# Patient Record
Sex: Female | Born: 1975 | Race: Black or African American | Hispanic: No | Marital: Single | Smoking: Never smoker
Health system: Southern US, Community
[De-identification: ages and names within clinical notes are randomized; demographics above are authoritative.]

## PROBLEM LIST (undated history)

## (undated) DIAGNOSIS — E119 Type 2 diabetes mellitus without complications: Secondary | ICD-10-CM

## (undated) DIAGNOSIS — R109 Unspecified abdominal pain: Secondary | ICD-10-CM

## (undated) DIAGNOSIS — F32A Depression, unspecified: Secondary | ICD-10-CM

## (undated) DIAGNOSIS — R1011 Right upper quadrant pain: Secondary | ICD-10-CM

## (undated) DIAGNOSIS — F329 Major depressive disorder, single episode, unspecified: Secondary | ICD-10-CM

## (undated) DIAGNOSIS — E559 Vitamin D deficiency, unspecified: Secondary | ICD-10-CM

## (undated) DIAGNOSIS — F419 Anxiety disorder, unspecified: Secondary | ICD-10-CM

## (undated) DIAGNOSIS — R569 Unspecified convulsions: Secondary | ICD-10-CM

## (undated) HISTORY — PX: ABDOMINAL HYSTERECTOMY: SHX81

## (undated) HISTORY — PX: TUBAL LIGATION: SHX77

## (undated) HISTORY — DX: Right upper quadrant pain: R10.11

---
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
## 2013-10-02 DIAGNOSIS — R569 Unspecified convulsions: Secondary | ICD-10-CM

## 2013-10-02 HISTORY — DX: Unspecified convulsions: R56.9

## 2015-04-28 ENCOUNTER — Encounter (HOSPITAL_COMMUNITY): Payer: Self-pay | Admitting: Emergency Medicine

## 2015-04-28 ENCOUNTER — Emergency Department (HOSPITAL_COMMUNITY)
Admission: EM | Admit: 2015-04-28 | Discharge: 2015-04-29 | Disposition: A | Discharge status: Home / Self Care | Payer: Medicaid Other | Attending: Emergency Medicine | Admitting: Emergency Medicine

## 2015-04-28 ENCOUNTER — Emergency Department (HOSPITAL_COMMUNITY): Payer: Medicaid Other

## 2015-04-28 DIAGNOSIS — E119 Type 2 diabetes mellitus without complications: Secondary | ICD-10-CM | POA: Diagnosis not present

## 2015-04-28 DIAGNOSIS — N730 Acute parametritis and pelvic cellulitis: Secondary | ICD-10-CM | POA: Diagnosis not present

## 2015-04-28 DIAGNOSIS — M546 Pain in thoracic spine: Secondary | ICD-10-CM | POA: Diagnosis not present

## 2015-04-28 DIAGNOSIS — R55 Syncope and collapse: Secondary | ICD-10-CM | POA: Diagnosis present

## 2015-04-28 DIAGNOSIS — R42 Dizziness and giddiness: Secondary | ICD-10-CM | POA: Insufficient documentation

## 2015-04-28 DIAGNOSIS — R102 Pelvic and perineal pain: Secondary | ICD-10-CM

## 2015-04-28 DIAGNOSIS — Z3202 Encounter for pregnancy test, result negative: Secondary | ICD-10-CM | POA: Diagnosis not present

## 2015-04-28 DIAGNOSIS — R531 Weakness: Secondary | ICD-10-CM | POA: Diagnosis not present

## 2015-04-28 DIAGNOSIS — M542 Cervicalgia: Secondary | ICD-10-CM | POA: Insufficient documentation

## 2015-04-28 HISTORY — DX: Type 2 diabetes mellitus without complications: E11.9

## 2015-04-28 LAB — COMPREHENSIVE METABOLIC PANEL
ALT: 15 U/L (ref 14–54)
AST: 19 U/L (ref 15–41)
Albumin: 3.9 g/dL (ref 3.5–5.0)
Alkaline Phosphatase: 58 U/L (ref 38–126)
Anion gap: 11 (ref 5–15)
BUN: 15 mg/dL (ref 6–20)
CO2: 19 mmol/L — ABNORMAL LOW (ref 22–32)
Calcium: 9.1 mg/dL (ref 8.9–10.3)
Chloride: 109 mmol/L (ref 101–111)
Creatinine, Ser: 1.12 mg/dL — ABNORMAL HIGH (ref 0.44–1.00)
GFR calc Af Amer: >60 mL/min (ref >60)
GFR calc non Af Amer: >60 mL/min (ref >60)
Glucose, Bld: 82 mg/dL (ref 65–99)
Potassium: 4 mmol/L (ref 3.5–5.1)
Sodium: 139 mmol/L (ref 135–145)
Total Bilirubin: 0.7 mg/dL (ref 0.3–1.2)
Total Protein: 7 g/dL (ref 6.5–8.1)

## 2015-04-28 LAB — URINALYSIS, ROUTINE W REFLEX MICROSCOPIC
Bilirubin Urine: NEGATIVE
Glucose, UA: NEGATIVE mg/dL
Ketones, ur: NEGATIVE mg/dL
Leukocytes, UA: NEGATIVE
Nitrite: NEGATIVE
Protein, ur: NEGATIVE mg/dL
Specific Gravity, Urine: 1.024 (ref 1.005–1.030)
Urobilinogen, UA: 1 mg/dL (ref 0.0–1.0)
pH: 5.5 (ref 5.0–8.0)

## 2015-04-28 LAB — CBC WITH DIFFERENTIAL/PLATELET
Basophils Absolute: 0 10*3/uL (ref 0.0–0.1)
Basophils Relative: 1 % (ref 0–1)
Eosinophils Absolute: 0.2 10*3/uL (ref 0.0–0.7)
Eosinophils Relative: 3 % (ref 0–5)
HCT: 33.6 % — ABNORMAL LOW (ref 36.0–46.0)
Hemoglobin: 11 g/dL — ABNORMAL LOW (ref 12.0–15.0)
Lymphocytes Relative: 44 % (ref 12–46)
Lymphs Abs: 3 10*3/uL (ref 0.7–4.0)
MCH: 28.4 pg (ref 26.0–34.0)
MCHC: 32.7 g/dL (ref 30.0–36.0)
MCV: 86.8 fL (ref 78.0–100.0)
Monocytes Absolute: 0.5 10*3/uL (ref 0.1–1.0)
Monocytes Relative: 7 % (ref 3–12)
Neutro Abs: 3.1 10*3/uL (ref 1.7–7.7)
Neutrophils Relative %: 45 % (ref 43–77)
Platelets: 206 10*3/uL (ref 150–400)
RBC: 3.87 MIL/uL (ref 3.87–5.11)
RDW: 17.4 % — ABNORMAL HIGH (ref 11.5–15.5)
WBC: 6.8 10*3/uL (ref 4.0–10.5)

## 2015-04-28 LAB — URINE MICROSCOPIC-ADD ON

## 2015-04-28 LAB — I-STAT CG4 LACTIC ACID, ED
Lactic Acid, Venous: 1.52 mmol/L (ref 0.5–2.0)
Lactic Acid, Venous: 2.35 mmol/L (ref 0.5–2.0)

## 2015-04-28 LAB — CK: Total CK: 123 U/L (ref 38–234)

## 2015-04-28 LAB — WET PREP, GENITAL: Yeast Wet Prep HPF POC: NONE SEEN

## 2015-04-28 LAB — CBG MONITORING, ED
Glucose-Capillary: 100 mg/dL — ABNORMAL HIGH (ref 65–99)
Glucose-Capillary: 55 mg/dL — ABNORMAL LOW (ref 65–99)

## 2015-04-28 LAB — LIPASE, BLOOD: Lipase: 24 U/L (ref 22–51)

## 2015-04-28 LAB — PREGNANCY, URINE: Preg Test, Ur: NEGATIVE

## 2015-04-28 MED ORDER — SODIUM CHLORIDE 0.9 % IV BOLUS (SEPSIS)
1000.0000 mL | Freq: Once | INTRAVENOUS | Status: AC
  Administered 2015-04-28: 1000 mL via INTRAVENOUS

## 2015-04-28 MED ORDER — DOXYCYCLINE HYCLATE 100 MG PO CAPS: 100.0000 mg | ORAL_CAPSULE | Freq: Two times a day (BID) | ORAL | Status: DC

## 2015-04-28 MED ORDER — MORPHINE SULFATE 4 MG/ML IJ SOLN
4.0000 mg | Freq: Once | INTRAMUSCULAR | Status: AC
  Administered 2015-04-28: 4 mg via INTRAVENOUS
  Filled 2015-04-28: qty 1

## 2015-04-28 MED ORDER — CEFTRIAXONE SODIUM 250 MG IJ SOLR
250.0000 mg | Freq: Once | INTRAMUSCULAR | Status: AC
  Administered 2015-04-28: 250 mg via INTRAMUSCULAR
  Filled 2015-04-28: qty 250

## 2015-04-28 MED ORDER — METRONIDAZOLE 500 MG PO TABS: 500.0000 mg | ORAL_TABLET | Freq: Two times a day (BID) | ORAL | Status: DC

## 2015-04-28 MED ORDER — LIDOCAINE HCL (PF) 1 % IJ SOLN
5.0000 mL | Freq: Once | INTRAMUSCULAR | Status: AC
  Administered 2015-04-28: 5 mL
  Filled 2015-04-28: qty 5

## 2015-04-28 NOTE — ED Notes (Signed)
CBG 100 

## 2015-04-28 NOTE — ED Provider Notes (Signed)
CSN: [id number]     Arrival date & time 04/28/15  1656 History   First MD Initiated Contact with Patient 04/28/15 1705     Chief Complaint  Patient presents with  . Loss of Consciousness     (Consider location/radiation/quality/duration/timing/severity/associated sxs/prior Treatment) HPI  Patient is a 39 year old female with a history of type 1 diabetes and reports seizures from hypoglycemia in the past presenting today for loss of consciousness. Patient reportedly walked to [employer] roughly 5 miles away and walked back with the groceries. When she returned she had a syncopal episode that was witnessed by her family. Deny any seizure-like activity. Patient does report palpitations prior to the event and felt like she was becoming weak. Also reports feeling that her blood sugar may have been decreased.  On arrival EMS found blood sugar to be 77 and gave patient food. Patient reports feeling weak and hurting in her neck and her back with generalized muscle aches since the event. No postictal period per EMS. Patient denies urinating herself or biting her tongue. Now reports mild chest pain with no shortness of breath. Reports grandmother  Past Medical History  Diagnosis Date  . Diabetes mellitus without complication    Past Surgical History  Procedure Laterality Date  . Tubal ligation     History reviewed. No pertinent family history. History  Substance Use Topics  . Smoking status: Never Smoker   . Smokeless tobacco: Not on file  . Alcohol Use: No   OB History    No data available     Review of Systems  Constitutional: Negative for fever and chills.  HENT: Negative for congestion and sore throat.   Eyes: Negative for pain.  Respiratory: Negative for cough and shortness of breath.   Cardiovascular: Negative for chest pain and palpitations.  Gastrointestinal: Negative for nausea, vomiting, abdominal pain and diarrhea.  Genitourinary: Negative for dysuria and flank pain.    Musculoskeletal: Positive for myalgias, back pain and neck pain. Negative for neck stiffness.  Skin: Negative for rash.  Allergic/Immunologic: Negative.   Neurological: Positive for dizziness, syncope, weakness and light-headedness. Negative for tremors, seizures, facial asymmetry, speech difficulty, numbness and headaches.  Psychiatric/Behavioral: Negative for confusion.      Allergies  Review of patient's allergies indicates no known allergies.  Home Medications   Prior to Admission medications   Medication Sig Start Date End Date Taking? Authorizing Provider  doxycycline (VIBRAMYCIN) 100 MG capsule Take 1 capsule (100 mg total) by mouth 2 (two) times daily. 04/28/15   Tery Sanfilippo, MD  metroNIDAZOLE (FLAGYL) 500 MG tablet Take 1 tablet (500 mg total) by mouth 2 (two) times daily. 04/28/15   Tery Sanfilippo, MD   BP 120/75 mmHg  Pulse 54  Temp(Src) 98.7 F (37.1 C) (Oral)  Resp 11  Ht 6\' 1"  (1.854 m)  Wt 200 lb (90.719 kg)  BMI 26.39 kg/m2  SpO2 100% Physical Exam  Constitutional: She is oriented to person, place, and time. She appears well-developed and well-nourished. No distress.  HENT:  Head: Normocephalic and atraumatic.  Eyes: Conjunctivae and EOM are normal. Pupils are equal, round, and reactive to light.  Neck: Normal range of motion. Neck supple.  Cardiovascular: Normal rate, regular rhythm and normal heart sounds.   Pulmonary/Chest: Effort normal and breath sounds normal. No respiratory distress.  Abdominal: Soft. Bowel sounds are normal. There is tenderness in the left lower quadrant. There is no rigidity, no rebound, no guarding, no CVA tenderness, no tenderness at McBurney's point and  negative Murphy's sign.    Genitourinary: Vagina normal. There is no rash, tenderness, lesion or injury on the right labia. There is no rash, tenderness, lesion or injury on the left labia. Cervix exhibits motion tenderness and discharge (frothy white). Cervix exhibits no  friability. Right adnexum displays no mass, no tenderness and no fullness. Left adnexum displays tenderness. Left adnexum displays no mass and no fullness.  Musculoskeletal: Normal range of motion.       Thoracic back: She exhibits tenderness. She exhibits normal range of motion and no bony tenderness.       Back:  Neurological: She is alert and oriented to person, place, and time. She has normal strength and normal reflexes. She displays normal reflexes. No cranial nerve deficit or sensory deficit. She displays a negative Romberg sign. GCS eye subscore is 4. GCS verbal subscore is 5. GCS motor subscore is 6.  Normal finger to nose bilaterally.  Rapid alternating movements intact bilaterally.  Normal heal to shin bilaterally.   No pronator drift bilaterally.    Skin: Skin is warm and dry. She is not diaphoretic.  Psychiatric: She has a normal mood and affect.    ED Course  Procedures (including critical care time) Labs Review Labs Reviewed  WET PREP, GENITAL - Abnormal; Notable for the following:    Trich, Wet Prep FEW (*)    Clue Cells Wet Prep HPF POC MANY (*)    WBC, Wet Prep HPF POC FEW (*)    All other components within normal limits  CBC WITH DIFFERENTIAL/PLATELET - Abnormal; Notable for the following:    Hemoglobin 11.0 (*)    HCT 33.6 (*)    RDW 17.4 (*)    All other components within normal limits  COMPREHENSIVE METABOLIC PANEL - Abnormal; Notable for the following:    CO2 19 (*)    Creatinine, Ser 1.12 (*)    All other components within normal limits  URINALYSIS, ROUTINE W REFLEX MICROSCOPIC (NOT AT [facility]) - Abnormal; Notable for the following:    APPearance CLOUDY (*)    Hgb urine dipstick TRACE (*)    All other components within normal limits  URINE MICROSCOPIC-ADD ON - Abnormal; Notable for the following:    Squamous Epithelial / LPF MANY (*)    Bacteria, UA FEW (*)    All other components within normal limits  CBG MONITORING, ED - Abnormal; Notable for the  following:    Glucose-Capillary 100 (*)    All other components within normal limits  I-STAT CG4 LACTIC ACID, ED - Abnormal; Notable for the following:    Lactic Acid, Venous 2.35 (*)    All other components within normal limits  CBG MONITORING, ED - Abnormal; Notable for the following:    Glucose-Capillary 55 (*)    All other components within normal limits  CBG MONITORING, ED - Abnormal; Notable for the following:    Glucose-Capillary 100 (*)    All other components within normal limits  URINE CULTURE  CK  LIPASE, BLOOD  PREGNANCY, URINE  I-STAT CG4 LACTIC ACID, ED  GC/CHLAMYDIA PROBE AMP ([place]) NOT AT [facility]    Imaging Review US Transvaginal Non-ob  04/28/2015   CLINICAL DATA:  Left adnexal tenderness for 2-3 days  EXAM: TRANSABDOMINAL AND TRANSVAGINAL ULTRASOUND OF PELVIS  TECHNIQUE: Both transabdominal and transvaginal ultrasound examinations of the pelvis were performed. Transabdominal technique was performed for global imaging of the pelvis including uterus, ovaries, adnexal regions, and pelvic cul-de-sac. It was necessary to proceed  with endovaginal exam following the transabdominal exam to visualize the uterus, endometrium and ovaries.  COMPARISON:  None  FINDINGS: Uterus  Measurements: 10.1 x 5.0 x 7.3 cm. Anterior intramural fibroid measures 4.8 x 4.2 x 4.7 cm.  Endometrium  Thickness: 9.4 mm.  No focal abnormality visualized.  Right ovary  Measurements: 2.9 x 1.6 x 2.0 cm. Normal appearance/no adnexal mass.  Left ovary  Measurements: 3.8 x 1.9 x 2.0 cm. Normal appearance/no adnexal mass.  Other findings  No free fluid.  IMPRESSION: 1. No acute findings. 2. Anterior intramural fibroid   Electronically Signed   By: Signa Kell M.D.   On: 04/28/2015 22:15   US Pelvis Complete  04/28/2015   CLINICAL DATA:  Left adnexal tenderness for 2-3 days  EXAM: TRANSABDOMINAL AND TRANSVAGINAL ULTRASOUND OF PELVIS  TECHNIQUE: Both transabdominal and transvaginal ultrasound examinations of  the pelvis were performed. Transabdominal technique was performed for global imaging of the pelvis including uterus, ovaries, adnexal regions, and pelvic cul-de-sac. It was necessary to proceed with endovaginal exam following the transabdominal exam to visualize the uterus, endometrium and ovaries.  COMPARISON:  None  FINDINGS: Uterus  Measurements: 10.1 x 5.0 x 7.3 cm. Anterior intramural fibroid measures 4.8 x 4.2 x 4.7 cm.  Endometrium  Thickness: 9.4 mm.  No focal abnormality visualized.  Right ovary  Measurements: 2.9 x 1.6 x 2.0 cm. Normal appearance/no adnexal mass.  Left ovary  Measurements: 3.8 x 1.9 x 2.0 cm. Normal appearance/no adnexal mass.  Other findings  No free fluid.  IMPRESSION: 1. No acute findings. 2. Anterior intramural fibroid   Electronically Signed   By: Signa Kell M.D.   On: 04/28/2015 22:15     EKG Interpretation   Date/Time:  Wednesday April 28 2015 17:07:08 EDT Ventricular Rate:  80 PR Interval:  179 QRS Duration: 83 QT Interval:  396 QTC Calculation: 457 R Axis:   68 Text Interpretation:  Sinus rhythm Consider left ventricular hypertrophy  Borderline T abnormalities, anterior leads No old tracing to compare  Confirmed by KNAPP  MD-J, JON ([id number]) on 04/28/2015 5:17:09 PM      MDM   Final diagnoses:  PID (acute pelvic inflammatory disease)  Syncope and collapse    Patient is a 39 year old female with a history of type 1 diabetes and reports seizures from hypoglycemia in the past presenting today for loss of consciousness  Ddx seizure, hypoglycemia, arrhythmogenic syncope, ACS, PE, vasovagal syncope.   On evaluation pt HDS in NAD.  Per EMS no seizure activity reported on the scene by family who witnessed.  She was caught and did not hit head.  Do not feel imaging warranted at this time.  Only borderline abnormalities and doubt arrhythmogenic syncope with no interval changes, brugada, or signs of HOCM present. Doubt ACS and pt with no CP or SOB with no  tachycardia or tachypnea.  Doubt PE.  Most likely hypoglycemia and/or vasovagal syncope from heat.   Pt reports abdominal pain with no peritoneal signs on exam.  Pelvic with CMT and mild LLQ tenderness.  Positive for trich and BV.  US pelvis negative for TOA, torsion, or cyst.  Pregnancy test negative.  Pt treated for PID and given information for follow up.   If performed, labs, EKGs, and imaging were reviewed/interpreted by myself and my attending and incorporated into medical decision making.  Discussed pertinent finding with patient or caregiver prior to discharge with no further questions.  Immediate return precautions given and pt or caregiver reports  understanding.  Pt care supervised by my attending Dr. Lynelle Doctor.   Tery Sanfilippo, MD PGY-2  Emergency Medicine    Tery Sanfilippo, MD 04/29/15 1242  Linwood Dibbles, MD 04/29/15 [phone]

## 2015-04-28 NOTE — ED Notes (Signed)
Pt walked to [employer] ( approx 5 miles)to get groceries and while walking back had a syncopal episode , pt states that she had a positive loc , family on scene helped her to the ground, pt is c/o general aches and pains , pt is a diabetic cbg was 70

## 2015-04-28 NOTE — ED Notes (Signed)
Patient to ultrasound

## 2015-04-28 NOTE — ED Notes (Signed)
Patient returned from ultrasound.

## 2015-04-29 LAB — GC/CHLAMYDIA PROBE AMP (~~LOC~~) NOT AT ARMC
Chlamydia: POSITIVE — AB
Neisseria Gonorrhea: NEGATIVE

## 2015-04-29 LAB — CBG MONITORING, ED: Glucose-Capillary: 100 mg/dL — ABNORMAL HIGH (ref 65–99)

## 2015-04-30 ENCOUNTER — Telehealth (HOSPITAL_COMMUNITY): Payer: Self-pay

## 2015-04-30 LAB — URINE CULTURE

## 2015-04-30 NOTE — Telephone Encounter (Signed)
Spoke with pt. Verified ID. Informed of labs. Treated per protocol. [employer] form faxed. Pt informed to abstain from sexual activity x 10 days and to notify partner for testing and treatment.  

## 2015-06-19 ENCOUNTER — Emergency Department (HOSPITAL_COMMUNITY): Payer: Medicaid Other

## 2015-06-19 ENCOUNTER — Observation Stay (HOSPITAL_COMMUNITY)
Admission: EM | Admit: 2015-06-19 | Discharge: 2015-06-21 | Disposition: A | Discharge status: Home / Self Care | Payer: Medicaid Other | Attending: Family Medicine | Admitting: Family Medicine

## 2015-06-19 ENCOUNTER — Encounter (HOSPITAL_COMMUNITY): Payer: Self-pay

## 2015-06-19 DIAGNOSIS — R440 Auditory hallucinations: Secondary | ICD-10-CM | POA: Diagnosis not present

## 2015-06-19 DIAGNOSIS — E119 Type 2 diabetes mellitus without complications: Secondary | ICD-10-CM

## 2015-06-19 DIAGNOSIS — R569 Unspecified convulsions: Secondary | ICD-10-CM | POA: Diagnosis not present

## 2015-06-19 DIAGNOSIS — F329 Major depressive disorder, single episode, unspecified: Secondary | ICD-10-CM

## 2015-06-19 DIAGNOSIS — F32A Depression, unspecified: Secondary | ICD-10-CM

## 2015-06-19 DIAGNOSIS — F319 Bipolar disorder, unspecified: Secondary | ICD-10-CM | POA: Diagnosis not present

## 2015-06-19 DIAGNOSIS — R780 Finding of alcohol in blood: Secondary | ICD-10-CM

## 2015-06-19 DIAGNOSIS — G40909 Epilepsy, unspecified, not intractable, without status epilepticus: Principal | ICD-10-CM | POA: Insufficient documentation

## 2015-06-19 DIAGNOSIS — F419 Anxiety disorder, unspecified: Secondary | ICD-10-CM | POA: Insufficient documentation

## 2015-06-19 HISTORY — DX: Major depressive disorder, single episode, unspecified: F32.9

## 2015-06-19 HISTORY — DX: Anxiety disorder, unspecified: F41.9

## 2015-06-19 HISTORY — DX: Depression, unspecified: F32.A

## 2015-06-19 LAB — COMPREHENSIVE METABOLIC PANEL
ALT: 52 U/L (ref 14–54)
AST: 45 U/L — ABNORMAL HIGH (ref 15–41)
Albumin: 4.3 g/dL (ref 3.5–5.0)
Alkaline Phosphatase: 87 U/L (ref 38–126)
Anion gap: 9 (ref 5–15)
BUN: 7 mg/dL (ref 6–20)
CO2: 22 mmol/L (ref 22–32)
Calcium: 9 mg/dL (ref 8.9–10.3)
Chloride: 106 mmol/L (ref 101–111)
Creatinine, Ser: 1.02 mg/dL — ABNORMAL HIGH (ref 0.44–1.00)
GFR calc Af Amer: >60 mL/min (ref >60)
GFR calc non Af Amer: >60 mL/min (ref >60)
Glucose, Bld: 118 mg/dL — ABNORMAL HIGH (ref 65–99)
Potassium: 4 mmol/L (ref 3.5–5.1)
Sodium: 137 mmol/L (ref 135–145)
Total Bilirubin: 0.5 mg/dL (ref 0.3–1.2)
Total Protein: 9 g/dL — ABNORMAL HIGH (ref 6.5–8.1)

## 2015-06-19 LAB — CBG MONITORING, ED
Glucose-Capillary: 116 mg/dL — ABNORMAL HIGH (ref 65–99)
Glucose-Capillary: 74 mg/dL (ref 65–99)

## 2015-06-19 LAB — I-STAT BETA HCG BLOOD, ED (MC, WL, AP ONLY): I-stat hCG, quantitative: <5 m[IU]/mL (ref <5)

## 2015-06-19 LAB — CBC
HCT: 35.2 % — ABNORMAL LOW (ref 36.0–46.0)
Hemoglobin: 11.4 g/dL — ABNORMAL LOW (ref 12.0–15.0)
MCH: 28.5 pg (ref 26.0–34.0)
MCHC: 32.4 g/dL (ref 30.0–36.0)
MCV: 88 fL (ref 78.0–100.0)
Platelets: 220 10*3/uL (ref 150–400)
RBC: 4 MIL/uL (ref 3.87–5.11)
RDW: 17.5 % — ABNORMAL HIGH (ref 11.5–15.5)
WBC: 7.3 10*3/uL (ref 4.0–10.5)

## 2015-06-19 LAB — ACETAMINOPHEN LEVEL: Acetaminophen (Tylenol), Serum: <10 ug/mL — ABNORMAL LOW (ref 10–30)

## 2015-06-19 LAB — ETHANOL: Alcohol, Ethyl (B): 96 mg/dL — ABNORMAL HIGH (ref <5)

## 2015-06-19 LAB — CK: Total CK: 232 U/L (ref 38–234)

## 2015-06-19 LAB — I-STAT CG4 LACTIC ACID, ED: Lactic Acid, Venous: 1.96 mmol/L (ref 0.5–2.0)

## 2015-06-19 LAB — SALICYLATE LEVEL: Salicylate Lvl: <4 mg/dL (ref 2.8–30.0)

## 2015-06-19 NOTE — ED Provider Notes (Signed)
CSN: [id number]     Arrival date & time 06/19/15  2000 History   First MD Initiated Contact with Patient 06/19/15 2013     Chief Complaint  Patient presents with  . Seizures    Patient is a 39 y.o. female presenting with general illness. The history is provided by the EMS personnel and medical records. The history is limited by the condition of the patient. No language interpreter was used.  Illness Location:  Generalized Quality:  Seizure Severity:  Unable to specify Onset quality:  Unable to specify Duration: Uknown. Timing:  Unable to specify Progression:  Unable to specify Chronicity:  New Context:  PMHx of DM presenting via EMS with seizure. Patient at home with family. Last seen normal yesterday. Was found on floor in room actively seizing by her daughter. EMS called. Patient unresponsive and seizing upon arrival. Point care glucose 80. D5 normal saline initiated. Versed 2.5 mg given with cessation. Patient protecting airway. Underwent another seizure in rounds with an additional 2.5 mg Versed given. No known history of seizures.   Past Medical History  Diagnosis Date  . Diabetes mellitus without complication    Past Surgical History  Procedure Laterality Date  . Tubal ligation     No family history on file. Social History  Substance Use Topics  . Smoking status: Never Smoker   . Smokeless tobacco: None  . Alcohol Use: No   OB History    No data available     Review of Systems  Unable to perform ROS Neurological: Positive for seizures.    Allergies  Review of patient's allergies indicates no known allergies.  Home Medications   Prior to Admission medications   Medication Sig Start Date End Date Taking? Authorizing Provider  doxycycline (VIBRAMYCIN) 100 MG capsule Take 1 capsule (100 mg total) by mouth 2 (two) times daily. 04/28/15   Tery Sanfilippo, MD  metroNIDAZOLE (FLAGYL) 500 MG tablet Take 1 tablet (500 mg total) by mouth 2 (two) times daily. 04/28/15    Tery Sanfilippo, MD   BP 120/85 mmHg  Pulse 74  Temp(Src) 98.2 F (36.8 C) (Temporal)  Resp 15  SpO2 98%   Physical Exam  Constitutional: No distress.  Generalized quivering consistent with chills but no evidence of active seizure  HENT:  Head: Normocephalic and atraumatic.  Eyes: Conjunctivae are normal. Pupils are equal, round, and reactive to light.  Neck: Normal range of motion. Neck supple.  Cardiovascular: Normal rate, regular rhythm and intact distal pulses.   Pulmonary/Chest: Effort normal and breath sounds normal. No respiratory distress.  Abdominal: Soft. Bowel sounds are normal. She exhibits no distension.  Musculoskeletal: Normal range of motion.  Neurological: She displays normal reflexes. She exhibits normal muscle tone.  Patient opening eyes spontaneously, following commands, but not verbal - only moaning to pain.  Skin: Skin is warm and dry. She is not diaphoretic.    ED Course  Procedures   Labs Review Labs Reviewed  CBC - Abnormal; Notable for the following:    Hemoglobin 11.4 (*)    HCT 35.2 (*)    RDW 17.5 (*)    All other components within normal limits  COMPREHENSIVE METABOLIC PANEL - Abnormal; Notable for the following:    Glucose, Bld 118 (*)    Creatinine, Ser 1.02 (*)    Total Protein 9.0 (*)    AST 45 (*)    All other components within normal limits  ACETAMINOPHEN LEVEL - Abnormal; Notable for the following:  Acetaminophen (Tylenol), Serum <10 (*)    All other components within normal limits  ETHANOL - Abnormal; Notable for the following:    Alcohol, Ethyl (B) 96 (*)    All other components within normal limits  CBG MONITORING, ED - Abnormal; Notable for the following:    Glucose-Capillary 116 (*)    All other components within normal limits  CK  SALICYLATE LEVEL  URINE RAPID DRUG SCREEN, HOSP PERFORMED  I-STAT BETA HCG BLOOD, ED ([facility], [facility], [facility] ONLY)  I-STAT CG4 LACTIC ACID, ED  CBG MONITORING, ED  I-STAT CG4 LACTIC ACID, ED     Imaging Review Ct Head Wo Contrast  06/19/2015   CLINICAL DATA:  39 year old female with new onset seizures. Patient found on the floor at home by daughter  EXAM: CT HEAD WITHOUT CONTRAST  TECHNIQUE: Contiguous axial images were obtained from the base of the skull through the vertex without intravenous contrast.  COMPARISON:  None.  FINDINGS: Evaluation of this exam is limited due to motion artifact.  The ventricles and sulci are appropriate in size for the patient's age. There is no intracranial hemorrhage. No mass effect or midline shift identified. The gray-white matter differentiation is preserved. There is no extra-axial fluid collection.  The visualized paranasal sinuses and mastoid air cells are well aerated. The calvarium is intact.  IMPRESSION: No acute intracranial pathology.   Electronically Signed   By: Elgie Collard M.D.   On: 06/19/2015 21:25   Dg Chest Portable 1 View  06/19/2015   CLINICAL DATA:  39 year old female found on the floor activity seizing.  EXAM: PORTABLE CHEST - 1 VIEW  COMPARISON:  None.  FINDINGS: The heart size and mediastinal contours are within normal limits. Both lungs are clear. The visualized skeletal structures are unremarkable.  IMPRESSION: No active disease.   Electronically Signed   By: Elgie Collard M.D.   On: 06/19/2015 20:49   I have personally reviewed and evaluated these images and lab results as part of my medical decision-making.   EKG Interpretation None      MDM  Jennifer Crawford is a 39 yo female w/ PMHx of DM presenting via EMS with seizure. Patient at home with family. Last seen normal yesterday. Was found on floor in room actively seizing by her daughter. EMS called. Patient unresponsive and seizing upon arrival. Point care glucose 80. D5 normal saline initiated. Versed 2.5 mg given with cessation. Patient protecting airway. Underwent another seizure in rounds with an additional 2.5 mg Versed given. No known history of seizures.  Exam  above notable for middle-age female lying in stretcher. Actively quivering but no evidence of active seizure. Afebrile. Heart rate 90s to low 100s. Normotensive. Maintaining saturations on room air without supplemental oxygen. Abdomen soft and nondistended. Patient opening eyes spontaneously, following commands, but not verbal - only moaning to pain.  EKG showing sinus tachycardia but no evidence of ST elevation or depression. I-STAT lactic acid 1.96. APAP with systolic levels undetectable. Alcohol level XCVI. CBC and CMP relatively unremarkable. CK level 232. CT head showing no acute cranial abnormality.  Patient still drowsy in the ED. Given 2 seizures within a few hour period with no previous history as well as a prolonged postictal state, neurology was consulted and evaluated the patient in the emergency department with recommendations to admit for brain MRI and EEG. Patient admitted to hospital service for the above recommendations.   Pt care discussed with and followed by my attending, Dr. Ree Kida,  MD Pager [phone]  Final diagnoses:  Seizure    Angelina Ok, MD 06/19/15 [id number]  Lavera Guise, MD 06/20/15 1450

## 2015-06-19 NOTE — ED Notes (Signed)
NRB removed, 2L [place] placed, O2 100%.

## 2015-06-19 NOTE — ED Notes (Signed)
Pt able to follow some commands upon arrival and now patient beginning to mumble, but incomprehensible at this time.

## 2015-06-19 NOTE — Consult Note (Signed)
Neurology Consultation Reason for Consult: Seizures Referring Physician: Verdie Mosher, D  CC: Seizures  History is obtained from:Patient  HPI: Jennifer Crawford is a 38 y.o. female with a history of depression, DM, HTN who presents with new onset seizure. She was having some beers with friends and then began walking home and states that she began to feel funny, with blurred vision on the way home. Today she was found actively seizing by her daughter. It is unclear how long she had been seizing for. She was given Versed which cessation of the seizure. She then had another seizure and was given another 2.5 mg of IV Versed with cessation.  Since then, she has had improvement in his becoming talkative.  She denies staring spells, lost time, previous seizures. She does have a history of depression and sometimes hears voices telling her things in the evening. This is something that has been followed by her psychiatrist in [place]. She no longer is able to see that psychiatrist since moving here.  She took Xanax daily at night for quite a while but stopped about 2 weeks ago.   ROS: A 14 point ROS was performed and is negative except as noted in the HPI.   Past Medical History  Diagnosis Date  . Diabetes mellitus without complication      Family history: Seizures in her daughter.   Social History:  reports that she has never smoked. She does not have any smokeless tobacco history on file. She reports that she does not drink alcohol or use illicit drugs.   Exam: Current vital signs: BP 130/79 mmHg  Pulse 93  Temp(Src) 98.2 F (36.8 C) (Temporal)  Resp 16  SpO2 98% Vital signs in last 24 hours: Temp:  [97.2 F (36.2 C)-98.2 F (36.8 C)] 98.2 F (36.8 C) (09/17 2019) Pulse Rate:  [93-114] 93 (09/17 2030) Resp:  [16-21] 16 (09/17 2030) BP: (130-145)/(79-84) 130/79 mmHg (09/17 2030) SpO2:  [98 %] 98 % (09/17 2003)  Physical Exam  Constitutional: Appears well-developed and well-nourished.   Psych: Affect appropriate to situation Eyes: No scleral injection HENT: No OP obstrucion Head: Normocephalic.  Cardiovascular: Normal rate and regular rhythm.  Respiratory: Effort normal and breath sounds normal to anterior ascultation GI: Soft.  No distension. There is no tenderness.  Skin: WDI  Neuro: Mental Status: Patient is awake, alert, oriented to person, place, month, year, and situation. Patient is able to give a clear and coherent history. No signs of aphasia or neglect Cranial Nerves: II: Visual Fields are full. Pupils are equal, round, and reactive to light.   III,IV, VI: EOMI without ptosis or diploplia.  V: Facial sensation is symmetric to temperature VII: Smile is asymmetric with less movement on the right and left and a mildly decreased nasolabial fold on the right. VIII: hearing is intact to voice X: Uvula elevates symmetrically XI: Shoulder shrug is symmetric. XII: tongue is midline without atrophy or fasciculations.  Motor: Tone is normal. Bulk is normal. 5/5 strength was present in all four extremities.  Sensory: Sensation is symmetric to light touch and temperature in the arms and legs. Deep Tendon Reflexes: 2+ and symmetric in the biceps and patellae.  Plantars: Toes are downgoing bilaterally.  Cerebellar: FNF and HKS are intact bilaterally         I have reviewed labs in epic and the results pertinent to this consultation are: BMP-unremarkable other than mildly elevated creatinine  I have reviewed the images obtained: CT head-unremarkable  Impression: 39 year old with new  onset seizure. Though there were 2 in immediate sequence, this was to be considered a single event for the consideration of starting antiepileptics therapy. 2 weeks does seem like it would be a long time for a Xanax withdrawal seizure, though I wonder if there could be some contribution. I would not favor starting antiepileptics therapy at this time but would pursue further  workup.   Recommendations: 1) MRI brain 2) EEG 3) if further seizures occur, then would start Keppra 1000 mg IV 1 followed by 500 mg twice a day  4) once patient is less drowsy, would discuss with her that she is not allowed to drive.    Ritta Slot, MD [facility] [phone]  If 7pm- 7am, please page neurology on call as listed in AMION.
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
## 2015-06-19 NOTE — ED Notes (Signed)
PER EMS: pt found on floor at home by daughter, actively seizing, no hx of seizures. Pt unresponsive with EMS and she began seizing again en route,  of Versed total given en route. Right side gaze, seizures lasted about 2 minutes. EMS assisting with ventilations. Family reports pt has been drinking beer heavily for the last few days. LSN yesterday. BP- 141/94, HR-96, CBG-80.

## 2015-06-20 ENCOUNTER — Observation Stay (HOSPITAL_COMMUNITY): Payer: Medicaid Other

## 2015-06-20 ENCOUNTER — Encounter (HOSPITAL_COMMUNITY): Payer: Self-pay | Admitting: Internal Medicine

## 2015-06-20 DIAGNOSIS — F329 Major depressive disorder, single episode, unspecified: Secondary | ICD-10-CM

## 2015-06-20 DIAGNOSIS — F419 Anxiety disorder, unspecified: Secondary | ICD-10-CM

## 2015-06-20 DIAGNOSIS — E119 Type 2 diabetes mellitus without complications: Secondary | ICD-10-CM | POA: Diagnosis not present

## 2015-06-20 DIAGNOSIS — R780 Finding of alcohol in blood: Secondary | ICD-10-CM

## 2015-06-20 DIAGNOSIS — R569 Unspecified convulsions: Secondary | ICD-10-CM | POA: Diagnosis not present

## 2015-06-20 DIAGNOSIS — F32A Depression, unspecified: Secondary | ICD-10-CM

## 2015-06-20 LAB — I-STAT CG4 LACTIC ACID, ED: Lactic Acid, Venous: 1.48 mmol/L (ref 0.5–2.0)

## 2015-06-20 LAB — BASIC METABOLIC PANEL
Anion gap: 7 (ref 5–15)
BUN: 7 mg/dL (ref 6–20)
CO2: 23 mmol/L (ref 22–32)
Calcium: 8.6 mg/dL — ABNORMAL LOW (ref 8.9–10.3)
Chloride: 108 mmol/L (ref 101–111)
Creatinine, Ser: 0.97 mg/dL (ref 0.44–1.00)
GFR calc Af Amer: >60 mL/min (ref >60)
GFR calc non Af Amer: >60 mL/min (ref >60)
Glucose, Bld: 95 mg/dL (ref 65–99)
Potassium: 3.5 mmol/L (ref 3.5–5.1)
Sodium: 138 mmol/L (ref 135–145)

## 2015-06-20 LAB — CBC
HCT: 32.4 % — ABNORMAL LOW (ref 36.0–46.0)
Hemoglobin: 10.6 g/dL — ABNORMAL LOW (ref 12.0–15.0)
MCH: 29 pg (ref 26.0–34.0)
MCHC: 32.7 g/dL (ref 30.0–36.0)
MCV: 88.5 fL (ref 78.0–100.0)
Platelets: 200 10*3/uL (ref 150–400)
RBC: 3.66 MIL/uL — ABNORMAL LOW (ref 3.87–5.11)
RDW: 17.9 % — ABNORMAL HIGH (ref 11.5–15.5)
WBC: 7.4 10*3/uL (ref 4.0–10.5)

## 2015-06-20 LAB — URINALYSIS, ROUTINE W REFLEX MICROSCOPIC
Bilirubin Urine: NEGATIVE
Glucose, UA: NEGATIVE mg/dL
Hgb urine dipstick: NEGATIVE
Ketones, ur: NEGATIVE mg/dL
Nitrite: NEGATIVE
Protein, ur: NEGATIVE mg/dL
Specific Gravity, Urine: 1.019 (ref 1.005–1.030)
Urobilinogen, UA: 0.2 mg/dL (ref 0.0–1.0)
pH: 5 (ref 5.0–8.0)

## 2015-06-20 LAB — RAPID URINE DRUG SCREEN, HOSP PERFORMED
Amphetamines: NOT DETECTED
Barbiturates: NOT DETECTED
Benzodiazepines: POSITIVE — AB
Cocaine: POSITIVE — AB
Opiates: NOT DETECTED
Tetrahydrocannabinol: NOT DETECTED

## 2015-06-20 LAB — GLUCOSE, CAPILLARY
Glucose-Capillary: 80 mg/dL (ref 65–99)
Glucose-Capillary: 77 mg/dL (ref 65–99)
Glucose-Capillary: 71 mg/dL (ref 65–99)
Glucose-Capillary: 100 mg/dL — ABNORMAL HIGH (ref 65–99)

## 2015-06-20 LAB — URINE MICROSCOPIC-ADD ON

## 2015-06-20 MED ORDER — NAPROXEN 250 MG PO TABS
500.0000 mg | ORAL_TABLET | Freq: Four times a day (QID) | ORAL | Status: DC | PRN
  Administered 2015-06-20: 500 mg via ORAL
  Filled 2015-06-20: qty 2

## 2015-06-20 MED ORDER — LORAZEPAM 2 MG/ML IJ SOLN
0.5000 mg | Freq: Once | INTRAMUSCULAR | Status: AC
  Administered 2015-06-20: 0.5 mg via INTRAVENOUS
  Filled 2015-06-20: qty 1

## 2015-06-20 MED ORDER — IBUPROFEN 200 MG PO TABS
800.0000 mg | ORAL_TABLET | Freq: Once | ORAL | Status: AC
  Administered 2015-06-20: 800 mg via ORAL
  Filled 2015-06-20: qty 4

## 2015-06-20 MED ORDER — VITAMIN B-1 100 MG PO TABS
100.0000 mg | ORAL_TABLET | Freq: Every day | ORAL | Status: DC
  Administered 2015-06-20 – 2015-06-21 (×2): 100 mg via ORAL
  Filled 2015-06-20 (×2): qty 1

## 2015-06-20 MED ORDER — LORAZEPAM 1 MG PO TABS
1.0000 mg | ORAL_TABLET | Freq: Four times a day (QID) | ORAL | Status: DC | PRN
  Administered 2015-06-20 (×2): 1 mg via ORAL
  Filled 2015-06-20: qty 1

## 2015-06-20 MED ORDER — LORAZEPAM 2 MG/ML IJ SOLN: 2.0000 mg | INTRAMUSCULAR | Status: DC | PRN

## 2015-06-20 MED ORDER — FOLIC ACID 1 MG PO TABS
1.0000 mg | ORAL_TABLET | Freq: Every day | ORAL | Status: DC
  Administered 2015-06-20 – 2015-06-21 (×2): 1 mg via ORAL
  Filled 2015-06-20 (×2): qty 1

## 2015-06-20 MED ORDER — THIAMINE HCL 100 MG/ML IJ SOLN
Freq: Once | INTRAVENOUS | Status: AC
  Administered 2015-06-20: 02:00:00 via INTRAVENOUS
  Filled 2015-06-20: qty 1000

## 2015-06-20 MED ORDER — ALUM & MAG HYDROXIDE-SIMETH 200-200-20 MG/5ML PO SUSP: 30.0000 mL | Freq: Four times a day (QID) | ORAL | Status: DC | PRN

## 2015-06-20 MED ORDER — THIAMINE HCL 100 MG/ML IJ SOLN: 100.0000 mg | Freq: Every day | INTRAMUSCULAR | Status: DC

## 2015-06-20 MED ORDER — POLYETHYLENE GLYCOL 3350 17 G PO PACK: 17.0000 g | PACK | Freq: Every day | ORAL | Status: DC | PRN

## 2015-06-20 MED ORDER — ENOXAPARIN SODIUM 40 MG/0.4ML ~~LOC~~ SOLN
40.0000 mg | SUBCUTANEOUS | Status: DC
  Administered 2015-06-20 – 2015-06-21 (×2): 40 mg via SUBCUTANEOUS
  Filled 2015-06-20 (×2): qty 0.4

## 2015-06-20 MED ORDER — ACETAMINOPHEN 325 MG PO TABS
650.0000 mg | ORAL_TABLET | Freq: Four times a day (QID) | ORAL | Status: DC | PRN
  Administered 2015-06-20 (×2): 650 mg via ORAL
  Filled 2015-06-20 (×2): qty 2

## 2015-06-20 MED ORDER — LORAZEPAM 1 MG PO TABS
0.0000 mg | ORAL_TABLET | Freq: Two times a day (BID) | ORAL | Status: DC
Start: 2015-06-22 — End: 2015-06-21

## 2015-06-20 MED ORDER — ACETAMINOPHEN 650 MG RE SUPP: 650.0000 mg | Freq: Four times a day (QID) | RECTAL | Status: DC | PRN

## 2015-06-20 MED ORDER — INSULIN ASPART 100 UNIT/ML ~~LOC~~ SOLN: 0.0000 [IU] | Freq: Three times a day (TID) | SUBCUTANEOUS | Status: DC

## 2015-06-20 MED ORDER — ADULT MULTIVITAMIN W/MINERALS CH
1.0000 | ORAL_TABLET | Freq: Every day | ORAL | Status: DC
  Administered 2015-06-20 – 2015-06-21 (×2): 1 via ORAL
  Filled 2015-06-20 (×2): qty 1

## 2015-06-20 MED ORDER — BISACODYL 10 MG RE SUPP: 10.0000 mg | Freq: Every day | RECTAL | Status: DC | PRN

## 2015-06-20 MED ORDER — LORAZEPAM 1 MG PO TABS
0.0000 mg | ORAL_TABLET | Freq: Four times a day (QID) | ORAL | Status: DC
  Administered 2015-06-20 (×2): 1 mg via ORAL
  Filled 2015-06-20: qty 2
  Filled 2015-06-20: qty 1

## 2015-06-20 MED ORDER — LORAZEPAM 2 MG/ML IJ SOLN: 1.0000 mg | Freq: Four times a day (QID) | INTRAMUSCULAR | Status: DC | PRN

## 2015-06-20 NOTE — Progress Notes (Signed)
Subjective: No recurrence of seizure activity reported. Hyperventilation had no complaints other than feeling sore all over.  Objective: Current vital signs: BP 108/62 mmHg  Pulse 74  Temp(Src) 98.1 F (36.7 C) (Oral)  Resp 18  SpO2 100%  Neurologic Exam: Patient was alert and in no acute distress. He was well-oriented to time as well as place. Extraocular movements were full and conjugate. Visual fields were intact and normal. No facial weakness was noted. Speech was normal. Extremity strength was normal throughout. Coordination was normal.  Medications: I have reviewed the patient's current medications.  Assessment/Plan: 39 year old lady presenting with new onset July seizures. Etiology is unclear. She is scheduled for MRI of the brain today. EEG will be obtained in the a.m.  No long-term anticonvulsive medication recommended at this point.  We will continue to follow this patient with you.  C.R. Roseanne Reno, MD [facility] [phone]  06/20/2015  9:21 AM
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
## 2015-06-21 ENCOUNTER — Observation Stay (HOSPITAL_COMMUNITY)
Admit: 2015-06-21 | Discharge: 2015-06-21 | Disposition: A | Discharge status: Home / Self Care | Payer: Medicaid Other | Attending: Internal Medicine | Admitting: Internal Medicine

## 2015-06-21 DIAGNOSIS — R569 Unspecified convulsions: Secondary | ICD-10-CM | POA: Diagnosis not present

## 2015-06-21 LAB — COMPREHENSIVE METABOLIC PANEL
ALT: 35 U/L (ref 14–54)
AST: 34 U/L (ref 15–41)
Albumin: 3.4 g/dL — ABNORMAL LOW (ref 3.5–5.0)
Alkaline Phosphatase: 74 U/L (ref 38–126)
Anion gap: 7 (ref 5–15)
BUN: 12 mg/dL (ref 6–20)
CO2: 22 mmol/L (ref 22–32)
Calcium: 8.7 mg/dL — ABNORMAL LOW (ref 8.9–10.3)
Chloride: 109 mmol/L (ref 101–111)
Creatinine, Ser: 1.06 mg/dL — ABNORMAL HIGH (ref 0.44–1.00)
GFR calc Af Amer: >60 mL/min (ref >60)
GFR calc non Af Amer: >60 mL/min (ref >60)
Glucose, Bld: 102 mg/dL — ABNORMAL HIGH (ref 65–99)
Potassium: 3.5 mmol/L (ref 3.5–5.1)
Sodium: 138 mmol/L (ref 135–145)
Total Bilirubin: 0.4 mg/dL (ref 0.3–1.2)
Total Protein: 6.6 g/dL (ref 6.5–8.1)

## 2015-06-21 LAB — URINE CULTURE

## 2015-06-21 LAB — GLUCOSE, CAPILLARY
Glucose-Capillary: 89 mg/dL (ref 65–99)
Glucose-Capillary: 100 mg/dL — ABNORMAL HIGH (ref 65–99)
Glucose-Capillary: 127 mg/dL — ABNORMAL HIGH (ref 65–99)

## 2015-06-21 LAB — HEMOGLOBIN A1C
Hgb A1c MFr Bld: 5.9 % — ABNORMAL HIGH (ref 4.8–5.6)
Mean Plasma Glucose: 123 mg/dL

## 2015-06-21 MED ORDER — NAPROXEN 500 MG PO TABS: 500.0000 mg | ORAL_TABLET | Freq: Four times a day (QID) | ORAL | Status: DC | PRN
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
## 2015-06-21 NOTE — Progress Notes (Signed)
Discharge instructions reviewed with patient/family. All questions answered at this time. Awaiting for taxi to transport home.    Sim Boast, RN

## 2015-06-21 NOTE — Care Management Note (Signed)
Case Management Note  Patient Details  Name: Jennifer Crawford MRN: [id number] Date of Birth: 1975/11/24  Subjective/Objective:                    Action/Plan: Pt admitted with seizures. Pt lives at home with family. Pt just moved from [place], [place] and states she does not have a PCP in this area. Pt has Medicaid insurance and CM met with patient and encouraged her to call Medicaid to have her PCP transferred to this area. CSW to assist with transportation issues at discharge. Bedside RN aware.   Expected Discharge Date:                  Expected Discharge Plan:  Home/Self Care  In-House Referral:     Discharge planning Services  CM Consult  Post Acute Care Choice:    Choice offered to:     DME Arranged:    DME Agency:     HH Arranged:    [place] Agency:     Status of Service:  Completed, signed off  Medicare Important Message Given:    Date Medicare IM Given:    Medicare IM give by:    Date Additional Medicare IM Given:    Additional Medicare Important Message give by:     If discussed at [place] of Stay Meetings, dates discussed:    Additional Comments:  Ollen Gross, RN 06/21/2015, 3:16 PM

## 2015-06-21 NOTE — Progress Notes (Signed)
EEG Completed; Results Pending  

## 2015-06-21 NOTE — Progress Notes (Signed)
Subjective: Patient sleeping this morning but easily awakened.  Reports no further seizure activity.  Complains of soreness all over.    Objective: Current vital signs: BP 116/64 mmHg  Pulse 61  Temp(Src) 97.8 F (36.6 C) (Oral)  Resp 16  SpO2 100% Vital signs in last 24 hours: Temp:  [97.8 F (36.6 C)-98.8 F (37.1 C)] 97.8 F (36.6 C) (09/19 1030) Pulse Rate:  [61-82] 61 (09/19 1030) Resp:  [16-18] 16 (09/19 1030) BP: (105-127)/(64-81) 116/64 mmHg (09/19 1030) SpO2:  [100 %] 100 % (09/19 1030)  Intake/Output from previous day: 09/18 0701 - 09/19 0700 In: 240 [P.O.:240] Out: 1000 [Urine:1000] Intake/Output this shift: Total I/O In: 240 [P.O.:240] Out: -  Nutritional status: Diet Carb Modified Fluid consistency:: Thin; Room service appropriate?: Yes Diet - low sodium heart healthy  Neurologic Exam: Mental Status: Lethargic.  Speech fluent.  Follows commands Cranial Nerves: II: Discs flat bilaterally; Visual fields grossly normal, pupils equal, round, reactive to light and accommodation III,IV, VI: ptosis not present, extra-ocular motions intact bilaterally V,VII: smile symmetric, facial light touch sensation normal bilaterally VIII: hearing normal bilaterally Motor: Able to move all extremities equally against gravity Sensory: Pinprick and light touch intact throughout, bilaterally   Lab Results: Basic Metabolic Panel:  Recent Labs Lab 06/19/15 2030 06/20/15 0547 06/21/15 0542  NA 137 138 138  K 4.0 3.5 3.5  CL 106 108 109  CO2 GLUCOSE 118* 95 102*  BUN CREATININE 1.02* 0.97 1.06*  CALCIUM 9.0 8.6* 8.7*    Liver Function Tests:  Recent Labs Lab 06/19/15 2030 06/21/15 0542  AST 45* 34  ALT 52 35  ALKPHOS 87 74  BILITOT 0.5 0.4  PROT 9.0* 6.6  ALBUMIN 4.3 3.4*   No results for input(s): LIPASE, AMYLASE in the last 168 hours. No results for input(s): AMMONIA in the last 168 hours.  CBC:  Recent Labs Lab 06/19/15 2030  06/20/15 0547  WBC 7.3 7.4  HGB 11.4* 10.6*  HCT 35.2* 32.4*  MCV 88.0 88.5  PLT 220 200    Cardiac Enzymes:  Recent Labs Lab 06/19/15 2030  CKTOTAL 232    Lipid Panel: No results for input(s): CHOL, TRIG, HDL, CHOLHDL, VLDL, LDLCALC in the last 168 hours.  CBG:  Recent Labs Lab 06/20/15 1149 06/20/15 1622 06/20/15 2211 06/21/15 0659 06/21/15 1102  GLUCAP 77 71 100* 89 100*    Microbiology: Results for orders placed or performed during the hospital encounter of 06/19/15  Culture, Urine     Status: None (Preliminary result)   Collection Time: 06/20/15  1:47 PM  Result Value Ref Range Status   Specimen Description URINE, RANDOM  Final   Special Requests NONE  Final   Culture CULTURE REINCUBATED FOR BETTER GROWTH  Final   Report Status PENDING  Incomplete    Coagulation Studies: No results for input(s): LABPROT, INR in the last 72 hours.  Imaging: Ct Head Wo Contrast  06/19/2015   CLINICAL DATA:  39 year old female with new onset seizures. Patient found on the floor at home by daughter  EXAM: CT HEAD WITHOUT CONTRAST  TECHNIQUE: Contiguous axial images were obtained from the base of the skull through the vertex without intravenous contrast.  COMPARISON:  None.  FINDINGS: Evaluation of this exam is limited due to motion artifact.  The ventricles and sulci are appropriate in size for the patient's age. There is no intracranial hemorrhage. No mass effect or midline shift identified. The gray-white matter  differentiation is preserved. There is no extra-axial fluid collection.  The visualized paranasal sinuses and mastoid air cells are well aerated. The calvarium is intact.  IMPRESSION: No acute intracranial pathology.   Electronically Signed   By: Elgie Collard M.D.   On: 06/19/2015 21:25   Mr Brain Wo Contrast  06/20/2015   CLINICAL DATA:  New onset seizures beginning in July. Recurrent seizures today. Generalized tonic colonic seizures.  EXAM: MRI HEAD WITHOUT  CONTRAST  TECHNIQUE: Multiplanar, multiecho pulse sequences of the brain and surrounding structures were obtained without intravenous contrast.  COMPARISON:  06/19/2015 CT  FINDINGS: Diffusion imaging does not show any acute or subacute infarction or other cause of restricted diffusion. The brain has normal appearance on all other pulse sequences without evidence of old infarction, mass lesion, hemorrhage, hydrocephalus or extra-axial collection. Mesial temporal lobes appear normal and symmetric. No abnormality seen to explain seizures. No pituitary mass. No inflammatory sinus disease. No skull or skullbase lesion. Major vessels at the base of the brain show flow.  IMPRESSION: Normal examination.  No abnormality seen to explain seizures.   Electronically Signed   By: Paulina Fusi M.D.   On: 06/20/2015 10:21   Dg Chest Portable 1 View  06/19/2015   CLINICAL DATA:  39 year old female found on the floor activity seizing.  EXAM: PORTABLE CHEST - 1 VIEW  COMPARISON:  None.  FINDINGS: The heart size and mediastinal contours are within normal limits. Both lungs are clear. The visualized skeletal structures are unremarkable.  IMPRESSION: No active disease.   Electronically Signed   By: Elgie Collard M.D.   On: 06/19/2015 20:49    Medications:  I have reviewed the patient's current medications. Scheduled: . enoxaparin (LOVENOX) injection  40 mg Subcutaneous Q24H  . folic acid  1 mg Oral Daily  . insulin aspart  0-9 Units Subcutaneous TID WC  . LORazepam  0-4 mg Oral Q6H   Followed by  . [START ON 06/22/2015] LORazepam  0-4 mg Oral Q12H  . multivitamin with minerals  1 tablet Oral Daily  . thiamine  100 mg Oral Daily   Or  . thiamine  100 mg Intravenous Daily    Assessment/Plan: No further seizures noted.  Imaging unremarkable.  EEG pending.    Recommendations: 1.  Will follow up results of EEG.  No anticonvulsant therapy indicated at this time.       Thana Farr, MD [facility] [phone] 06/21/2015  1:16 PM

## 2015-06-21 NOTE — Procedures (Signed)
ELECTROENCEPHALOGRAM REPORT   Patient: Jennifer Crawford      Room #: 4M-06 Age: 39 y.o.        Sex: female Referring Physician: Dr Mahala Menghini [facility] Report Date:  06/21/2015        Interpreting Physician: Omelia Blackwater  History: Jennifer Crawford is an 39 y.o. female new onset seizure   Conditions of Recording:  This is a 19 channel EEG carried out with the patient in the drowsy state.  Description:  The waking background activity consists of a low voltage, symmetrical, fairly well organized, 9-11 Hz alpha activity, seen from the parieto-occipital and posterior temporal regions.  No focal slowing or epileptiform activity is noted.  The patient drowses with slowing to irregular, low voltage theta and beta activity.    Hyperventilation produced a mild to moderate buildup but failed to elicit any abnormalities.  Intermittent photic stimulation was performed and elicits a symmetrical driving response but fails to elicit any abnormalities.  IMPRESSION: Normal electroencephalogram. There are no focal lateralizing or epileptiform features.   Jennifer Cho, DO [facility] [phone]  If 7pm- 7am, please page neurology on call as listed in AMION. 06/21/2015, 3:11 PM

## 2016-09-17 IMAGING — MR MR HEAD W/O CM
7 of 10 series · 36 of 48 positions shown · IV contrast (Yes)
Comparison: 06/19/2015 CT

CLINICAL DATA: New onset seizures beginning in [REDACTED]. Recurrent
seizures today. Generalized tonic colonic seizures.

EXAM:
MRI HEAD WITHOUT CONTRAST
TECHNIQUE: Multiplanar, multiecho pulse sequences of the brain and surrounding
structures were obtained without intravenous contrast.

[Series 4: DWI · axial · 3.6mm · 0.94mm/px · z∈[-174,-27]mm · 11 of 85 slices shown (1 of 3)]
[im 1/85]
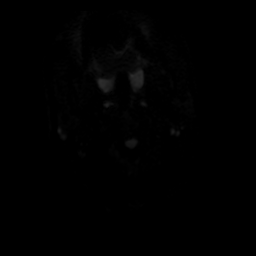
[im 9/85]
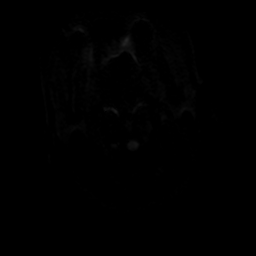
[im 17/85]
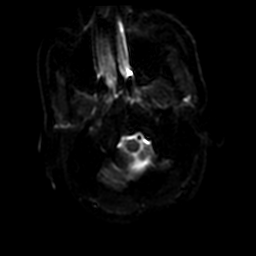
[im 26/85]
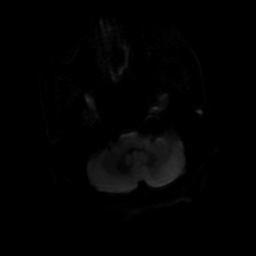
[im 34/85]
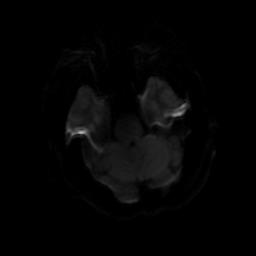
[im 43/85]
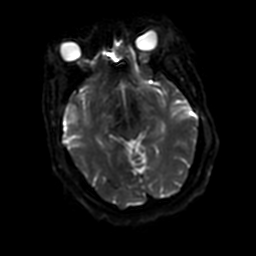
[im 51/85]
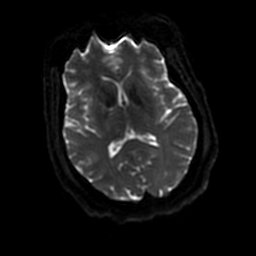
[im 59/85]
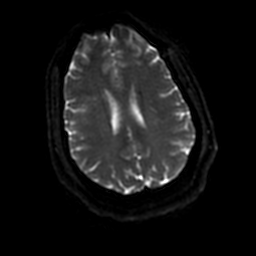
[im 68/85]
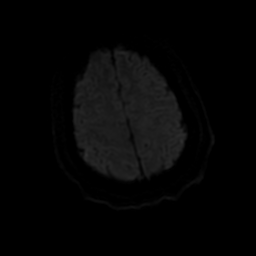
[im 76/85]
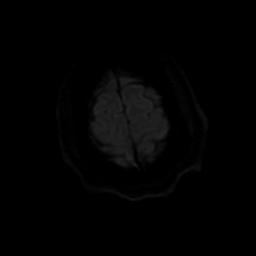
[im 85/85]
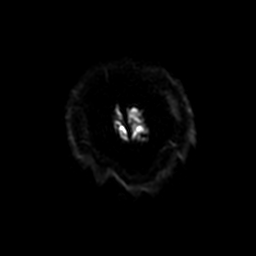

[Series 5: T2 · axial · 5.0mm · 0.47mm/px · z∈[-152,-16]mm · 3 of 24 slices shown]
[im 1/24]
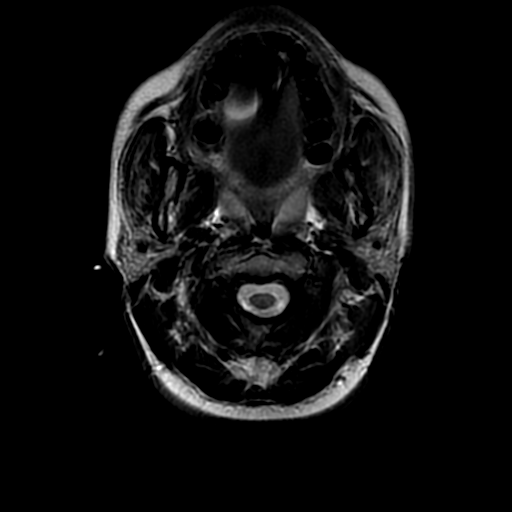
[im 12/24]
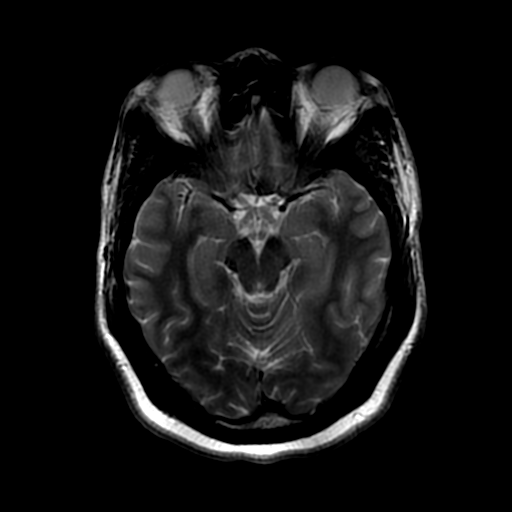
[im 24/24]
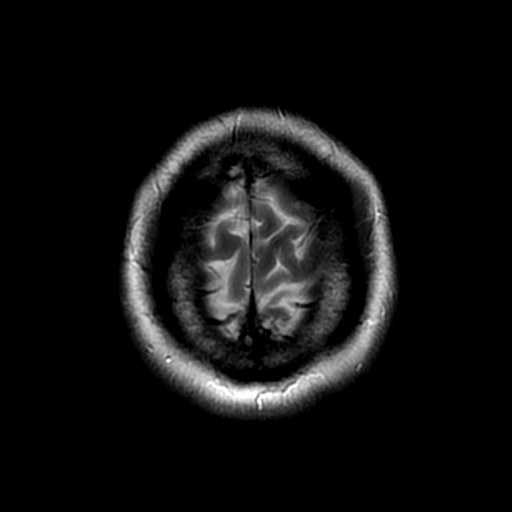

[Series 6: FLAIR · axial · 5.0mm · 0.47mm/px · z∈[-152,-16]mm · 3 of 24 slices shown (1 of 3)]
[im 1/24]
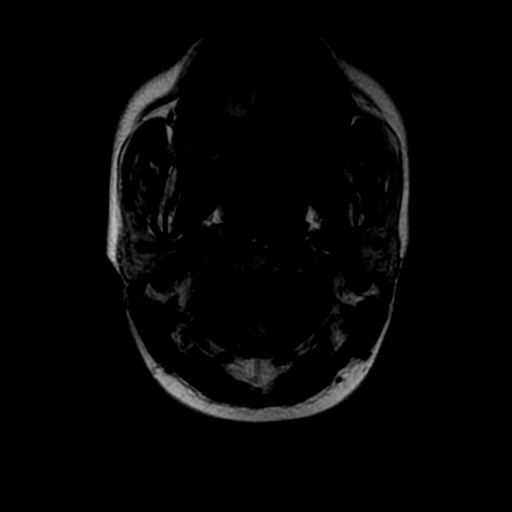
[im 12/24]
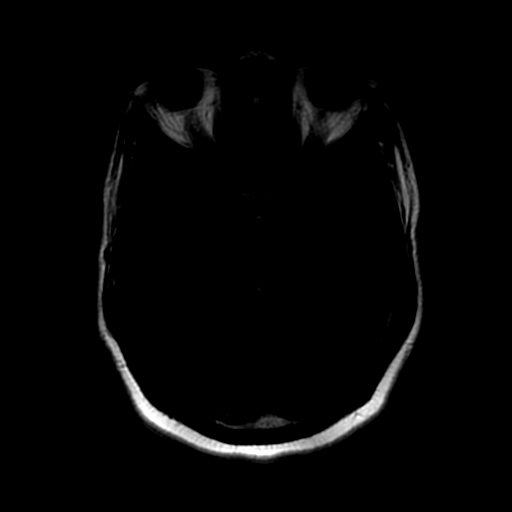
[im 24/24]
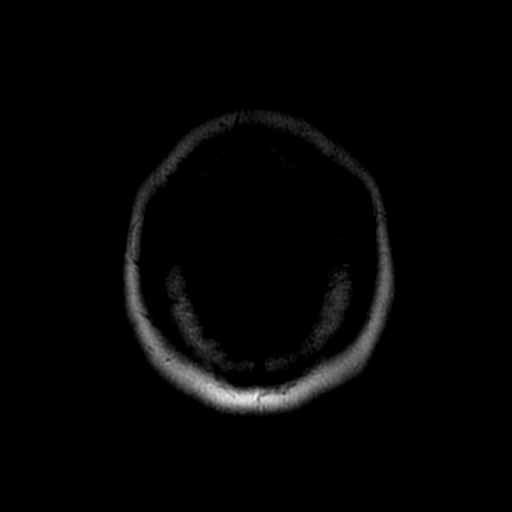

[Series 9: FLAIR · axial · 5.0mm · 0.47mm/px · z∈[-152,-16]mm · 3 of 24 slices shown (2 of 3)]
[im 1/24]
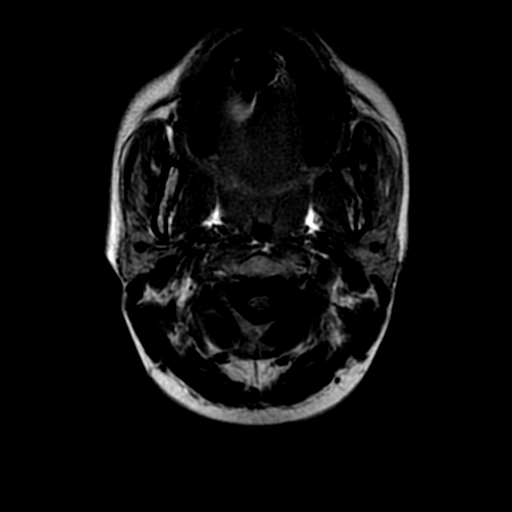
[im 12/24]
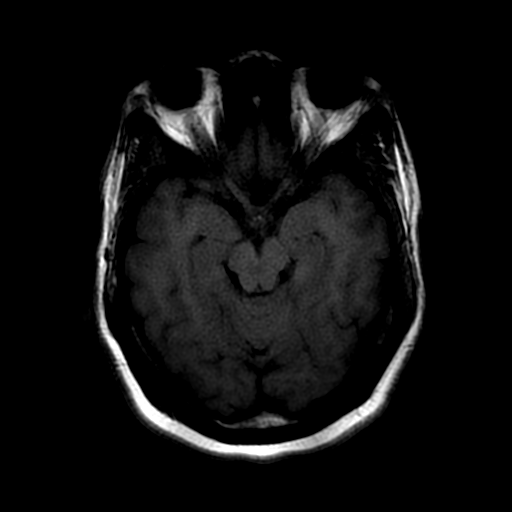
[im 24/24]
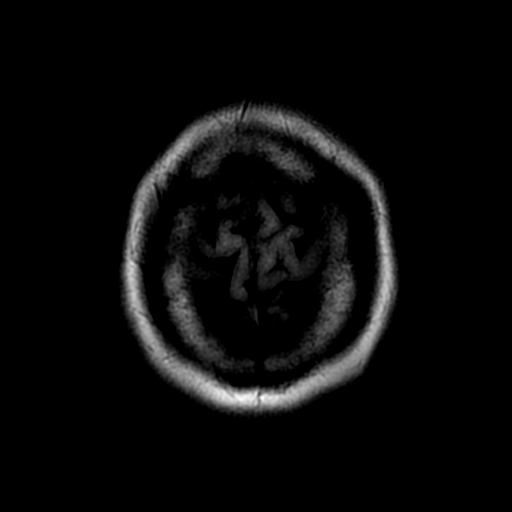

[Series 11: FLAIR · sagittal · 5.0mm · 0.47mm/px · 3 of 24 slices shown (3 of 3)]
[im 1/24]
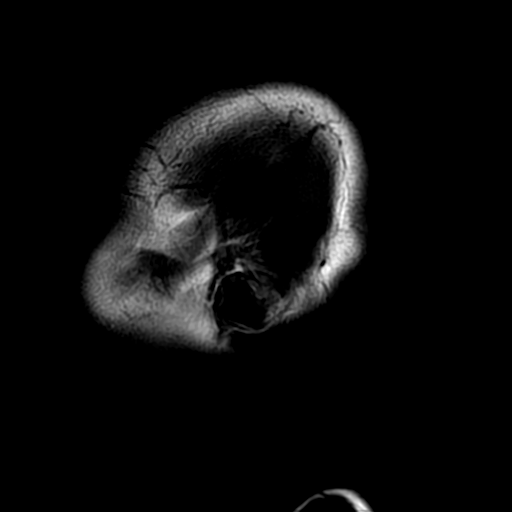
[im 12/24]
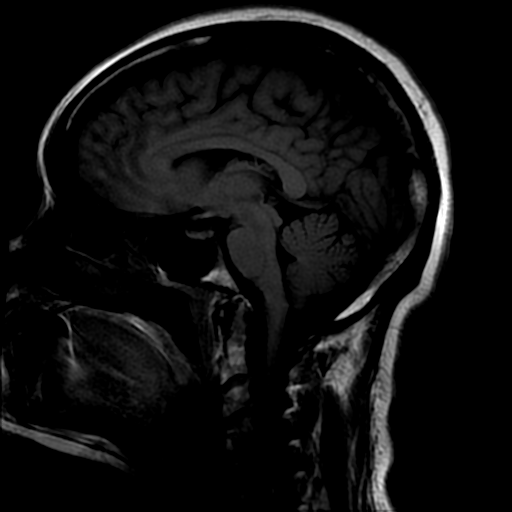
[im 24/24]
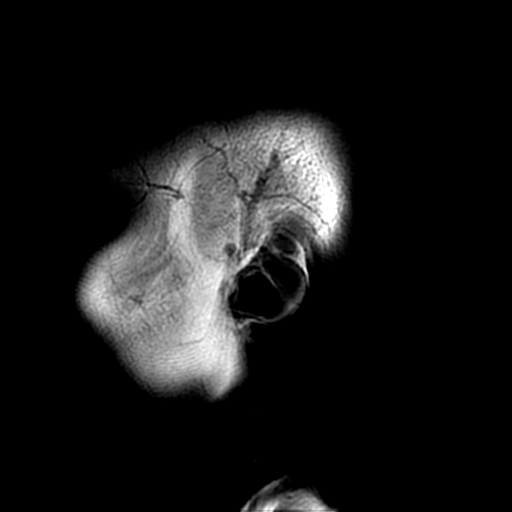

[Series 12: DWI · coronal · 5.0mm · 0.94mm/px · 8 of 72 slices shown (2 of 3)]
[im 1/72]
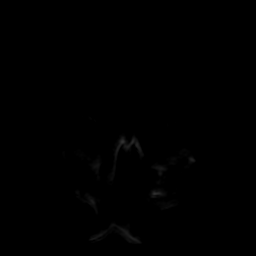
[im 9/72]
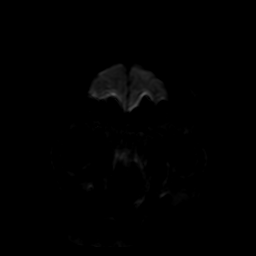
[im 18/72]
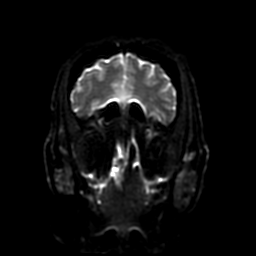
[im 27/72]
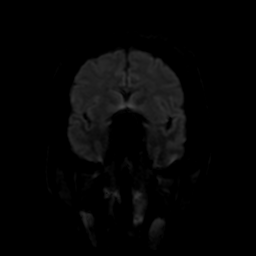
[im 45/72]
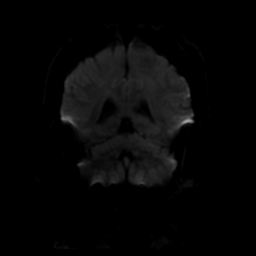
[im 54/72]
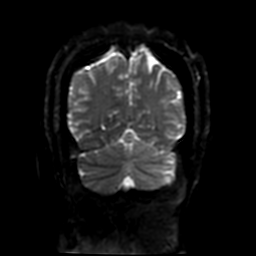
[im 63/72]
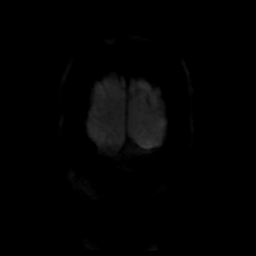
[im 72/72]
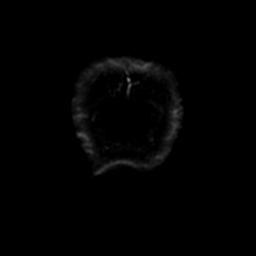

[Series 400: DWI · axial · 3.6mm · 0.94mm/px · z∈[-174,-27]mm · 5 of 42 slices shown (3 of 3)]
[im 1/42]
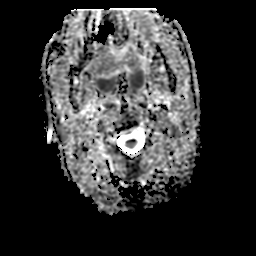
[im 11/42]
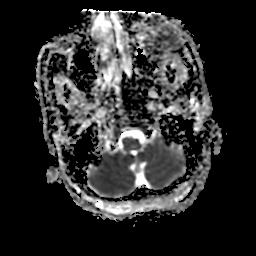
[im 21/42]
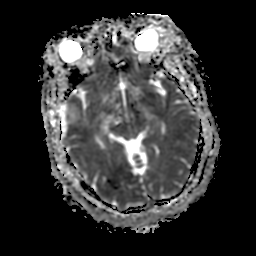
[im 31/42]
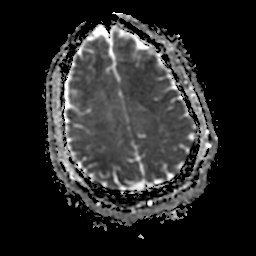
[im 42/42]
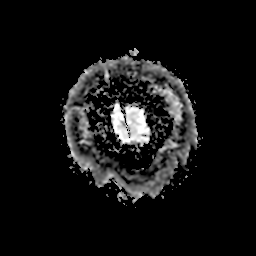

[36 of 48 positions shown; findings below may reference images not displayed]

FINDINGS: Diffusion imaging does not show any acute or subacute infarction or
other cause of restricted diffusion. The brain has normal appearance
on all other pulse sequences without evidence of old infarction,
mass lesion, hemorrhage, hydrocephalus or extra-axial collection.
Mesial temporal lobes appear normal and symmetric. No abnormality
seen to explain seizures. No pituitary mass. No inflammatory sinus
disease. No skull or skullbase lesion. Major vessels at the base of
the brain show flow.
IMPRESSION: Normal examination.  No abnormality seen to explain seizures.

## 2019-04-20 ENCOUNTER — Emergency Department (HOSPITAL_COMMUNITY): Payer: Medicaid Other

## 2019-04-20 ENCOUNTER — Other Ambulatory Visit: Payer: Self-pay

## 2019-04-20 ENCOUNTER — Emergency Department (HOSPITAL_COMMUNITY)
Admission: EM | Admit: 2019-04-20 | Discharge: 2019-04-20 | Disposition: A | Discharge status: Home / Self Care | Payer: Medicaid Other | Attending: Emergency Medicine | Admitting: Emergency Medicine

## 2019-04-20 ENCOUNTER — Encounter (HOSPITAL_COMMUNITY): Payer: Self-pay | Admitting: *Deleted

## 2019-04-20 DIAGNOSIS — E119 Type 2 diabetes mellitus without complications: Secondary | ICD-10-CM | POA: Insufficient documentation

## 2019-04-20 DIAGNOSIS — M25512 Pain in left shoulder: Secondary | ICD-10-CM

## 2019-04-20 MED ORDER — IBUPROFEN 800 MG PO TABS: 800.0000 mg | ORAL_TABLET | Freq: Three times a day (TID) | ORAL | 0 refills | Status: DC | PRN

## 2019-04-20 MED ORDER — METHOCARBAMOL 500 MG PO TABS: 500.0000 mg | ORAL_TABLET | Freq: Two times a day (BID) | ORAL | 0 refills | Status: DC | PRN

## 2019-04-20 MED ORDER — METHOCARBAMOL 500 MG PO TABS
500.0000 mg | ORAL_TABLET | Freq: Once | ORAL | Status: AC
Start: 2019-04-20 — End: 2019-04-20
  Administered 2019-04-20: 500 mg via ORAL
  Filled 2019-04-20: qty 1

## 2019-04-20 MED ORDER — HYDROCODONE-ACETAMINOPHEN 5-325 MG PO TABS
1.0000 | ORAL_TABLET | Freq: Once | ORAL | Status: AC
  Administered 2019-04-20: 1 via ORAL
  Filled 2019-04-20: qty 1
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
## 2019-04-20 NOTE — Discharge Instructions (Signed)
It was my pleasure taking care of you today!  Ibuprofen as needed for pain. Robaxin is your muscle relaxer to take as needed. Ice shoulder throughout the day (instructions below).  Wear shoulder sling for no more than 3 days, then begin performing gentle range of motion exercises.   Call the orthopedist listed if symptoms are not improved in one week to schedule a follow up appointment.   Return to the ER for new or worsening symptoms, any additional concerns.  COLD THERAPY DIRECTIONS:  Ice or gel packs can be used to reduce both pain and swelling. Ice is the most helpful within the first 24 to 48 hours after an injury or flareup from overusing a muscle or joint.  Ice is effective, has very few side effects, and is safe for most people to use.   If you expose your skin to cold temperatures for too long or without the proper protection, you can damage your skin or nerves. Watch for signs of skin damage due to cold.   HOME CARE INSTRUCTIONS  Follow these tips to use ice and cold packs safely.  Place a dry or damp towel between the ice and skin. A damp towel will cool the skin more quickly, so you may need to shorten the time that the ice is used.  For a more rapid response, add gentle compression to the ice.  Ice for no more than 10 to 20 minutes at a time. The bonier the area you are icing, the less time it will take to get the benefits of ice.  Check your skin after 5 minutes to make sure there are no signs of a poor response to cold or skin damage.  Rest 20 minutes or more in between uses.  Once your skin is numb, you can end your treatment. You can test numbness by very lightly touching your skin. The touch should be so light that you do not see the skin dimple from the pressure of your fingertip. When using ice, most people will feel these normal sensations in this order: cold, burning, aching, and numbness.

## 2019-04-20 NOTE — ED Notes (Signed)
Med given 
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
## 2019-04-25 ENCOUNTER — Telehealth: Payer: Self-pay | Admitting: *Deleted

## 2019-04-25 ENCOUNTER — Other Ambulatory Visit: Payer: Self-pay

## 2019-04-25 ENCOUNTER — Emergency Department (HOSPITAL_COMMUNITY)
Admission: EM | Admit: 2019-04-25 | Discharge: 2019-04-25 | Disposition: A | Discharge status: Home / Self Care | Payer: Medicaid Other | Attending: Emergency Medicine | Admitting: Emergency Medicine

## 2019-04-25 DIAGNOSIS — N390 Urinary tract infection, site not specified: Secondary | ICD-10-CM | POA: Diagnosis not present

## 2019-04-25 DIAGNOSIS — Z20828 Contact with and (suspected) exposure to other viral communicable diseases: Secondary | ICD-10-CM | POA: Diagnosis not present

## 2019-04-25 DIAGNOSIS — E119 Type 2 diabetes mellitus without complications: Secondary | ICD-10-CM | POA: Diagnosis not present

## 2019-04-25 DIAGNOSIS — R509 Fever, unspecified: Secondary | ICD-10-CM | POA: Diagnosis present

## 2019-04-25 LAB — BASIC METABOLIC PANEL
Anion gap: 9 (ref 5–15)
BUN: 13 mg/dL (ref 6–20)
CO2: 23 mmol/L (ref 22–32)
Calcium: 9.2 mg/dL (ref 8.9–10.3)
Chloride: 108 mmol/L (ref 98–111)
Creatinine, Ser: 0.92 mg/dL (ref 0.44–1.00)
GFR calc Af Amer: >60 mL/min (ref >60)
GFR calc non Af Amer: >60 mL/min (ref >60)
Glucose, Bld: 96 mg/dL (ref 70–99)
Potassium: 3.7 mmol/L (ref 3.5–5.1)
Sodium: 140 mmol/L (ref 135–145)

## 2019-04-25 LAB — CBG MONITORING, ED: Glucose-Capillary: 81 mg/dL (ref 70–99)

## 2019-04-25 LAB — URINALYSIS, ROUTINE W REFLEX MICROSCOPIC
Bilirubin Urine: NEGATIVE
Glucose, UA: NEGATIVE mg/dL
Ketones, ur: NEGATIVE mg/dL
Nitrite: NEGATIVE
Protein, ur: NEGATIVE mg/dL
Specific Gravity, Urine: 1.023 (ref 1.005–1.030)
pH: 5 (ref 5.0–8.0)

## 2019-04-25 LAB — CBC
HCT: 37.6 % (ref 36.0–46.0)
Hemoglobin: 12.5 g/dL (ref 12.0–15.0)
MCH: 31.3 pg (ref 26.0–34.0)
MCHC: 33.2 g/dL (ref 30.0–36.0)
MCV: 94.2 fL (ref 80.0–100.0)
Platelets: 177 10*3/uL (ref 150–400)
RBC: 3.99 MIL/uL (ref 3.87–5.11)
RDW: 12.9 % (ref 11.5–15.5)
WBC: 5.5 10*3/uL (ref 4.0–10.5)
nRBC: 0 % (ref 0.0–0.2)

## 2019-04-25 MED ORDER — CEPHALEXIN 500 MG PO CAPS: 500.0000 mg | ORAL_CAPSULE | Freq: Two times a day (BID) | ORAL | 0 refills | Status: AC

## 2019-04-25 NOTE — ED Provider Notes (Signed)
[facility]   CSN: [id number] Arrival date & time: 04/25/19  [id number]    History   Chief Complaint Chief Complaint  Patient presents with  . Abdominal Pain  . Fever    HPI Jennifer Crawford is a 43 y.o. female.     HPI Patient presents to the emergency room for evaluation of myalgias, feeling feverish and fatigue.  Patient states she started having the symptoms a few days ago.  She has had a headache and is having diffuse body pain throughout.  She has felt intermittently chilled and hot.  She took her temperature the other day and it was 100 but she had taken some Tylenol.  She has not measured any higher temperatures.  She denies any sore throat or nasal congestion.  No cough or shortness of breath.  She denies any abdominal pain vomiting or diarrhea.  No dysuria.  Patient states she has not been around any known ill contacts. Past Medical History:  Diagnosis Date  . Anxiety and depression   . Diabetes mellitus without complication [facility](HCC)     Patient Active Problem List   Diagnosis Date Noted  . Diabetes mellitus type 2 in nonobese (HCC) 06/20/2015  . Elevated blood alcohol level 06/20/2015  . Anxiety and depression 06/20/2015  . Seizure (HCC) 06/19/2015    Past Surgical History:  Procedure Laterality Date  . TUBAL LIGATION       OB History   No obstetric history on file.      Home Medications    Prior to Admission medications   Medication Sig Start Date End Date Taking? Authorizing Provider  cephALEXin (KEFLEX) 500 MG capsule Take 1 capsule (500 mg total) by mouth 2 (two) times daily for 7 days. 04/25/19 05/02/19  Linwood DibblesKnapp, Anoushka Divito, MD  ibuprofen (ADVIL) 800 MG tablet Take 1 tablet (800 mg total) by mouth every 8 (eight) hours as needed for mild pain or moderate pain. 04/20/19   Ward, Chase PicketJaime Pilcher, PA-C  methocarbamol (ROBAXIN) 500 MG tablet Take 1 tablet (500 mg total) by mouth 2 (two) times daily as needed. 04/20/19    Ward, Chase PicketJaime Pilcher, PA-C  naproxen (NAPROSYN) 500 MG tablet Take 1 tablet (500 mg total) by mouth every 6 (six) hours as needed for mild pain, moderate pain or headache. 06/21/15   Rhetta MuraSamtani, Jai-Gurmukh, MD    Family History Family History  Problem Relation Age of Onset  . Seizures Daughter   . Asthma Son   . Hypertension Father   . Arrhythmia Father        Status post pacemaker insertion    Social History Social History   Tobacco Use  . Smoking status: Never Smoker  . Smokeless tobacco: Never Used  Substance Use Topics  . Alcohol use: Yes    Alcohol/week: 0.0 standard drinks    Comment: Occasional social  . Drug use: No     Allergies   Patient has no known allergies.   Review of Systems Review of Systems  All other systems reviewed and are negative.    Physical Exam Updated Vital Signs BP (!) 134/92   Pulse 70   Temp 98.1 F (36.7 C) (Oral)   Resp 16   Ht 1.854 m (6\' 1" )   Wt 79.4 kg   SpO2 100%   BMI 23.09 kg/m   Physical Exam Vitals signs and nursing note reviewed.  Constitutional:      General: She is not in acute distress.  Appearance: She is well-developed.  HENT:     Head: Normocephalic and atraumatic.     Right Ear: External ear normal.     Left Ear: External ear normal.  Eyes:     General: No scleral icterus.       Right eye: No discharge.        Left eye: No discharge.     Conjunctiva/sclera: Conjunctivae normal.  Neck:     Musculoskeletal: Neck supple.     Trachea: No tracheal deviation.  Cardiovascular:     Rate and Rhythm: Normal rate and regular rhythm.  Pulmonary:     Effort: Pulmonary effort is normal. No respiratory distress.     Breath sounds: Normal breath sounds. No stridor. No wheezing or rales.  Abdominal:     General: Bowel sounds are normal. There is no distension.     Palpations: Abdomen is soft.     Tenderness: There is no abdominal tenderness. There is no guarding or rebound.  Musculoskeletal:        General: No  tenderness.  Skin:    General: Skin is warm and dry.     Findings: No rash.  Neurological:     Mental Status: She is alert.     Cranial Nerves: No cranial nerve deficit (no facial droop, extraocular movements intact, no slurred speech).     Sensory: No sensory deficit.     Motor: No abnormal muscle tone or seizure activity.     Coordination: Coordination normal.      ED Treatments / Results  Labs (all labs ordered are listed, but only abnormal results are displayed) Labs Reviewed  URINALYSIS, ROUTINE W REFLEX MICROSCOPIC - Abnormal; Notable for the following components:      Result Value   Hgb urine dipstick MODERATE (*)    Leukocytes,Ua TRACE (*)    Bacteria, UA RARE (*)    All other components within normal limits  NOVEL CORONAVIRUS, NAA (HOSPITAL ORDER, SEND-OUT TO REF LAB)  URINE CULTURE  CBC  BASIC METABOLIC PANEL  CBG MONITORING, ED     Procedures Procedures (including critical care time)  Medications Ordered in ED Medications - No data to display   Initial Impression / Assessment and Plan / ED Course  I have reviewed the triage vital signs and the nursing notes.  Pertinent labs & imaging results that were available during my care of the patient were reviewed by me and considered in my medical decision making (see chart for details).      Patient's laboratory tests are reassuring with exception of urinalysis suggestive of urinary tract infection.  Patient has had some abdominal discomfort.  She is afebrile and nontoxic.  She is not hypoxic.  Plan on discharge home with course of oral antibiotics.  Symptoms atypical for covid  19 but a test was sent off.  Patient instructed to continue home until results are back  Jennifer Crawford was evaluated in Emergency Department on 04/25/2019 for the symptoms described in the history of present illness. She was evaluated in the context of the global COVID-19 pandemic, which necessitated consideration that the patient might be  at risk for infection with the SARS-CoV-2 virus that causes COVID-19. Institutional protocols and algorithms that pertain to the evaluation of patients at risk for COVID-19 are in a state of rapid change based on information released by regulatory bodies including the CDC and federal and state organizations. These policies and algorithms were followed during the patient's care in the ED.  Final Clinical Impressions(s) / ED Diagnoses   Final diagnoses:  Lower urinary tract infectious disease    ED Discharge Orders         Ordered    cephALEXin (KEFLEX) 500 MG capsule  2 times daily     04/25/19 1213           Dorie Rank, MD 04/25/19 1217

## 2019-04-25 NOTE — Telephone Encounter (Addendum)
130 pm TOC CM received call from pt stating her Rx went to the wrong [facility], explained she can call and have transferred.   320 pm Did follow up with pt and states she is picking up medication now. Pt has no PCP. Appt arranged with [facility] for 8/11 at 1 pm. Explained to pt the importance of follow up with PCP. Provided her with contact info via text per pt's request.  Jonnie Finner RN CCM Case Mgmt phone [phone]

## 2019-04-25 NOTE — Discharge Instructions (Signed)
Take the antibiotics as prescribed, return to the emergency room as needed for high fevers, worsening symptoms.  A  covid 19 tests was sent.  You can access your results with mychart

## 2019-04-25 NOTE — ED Triage Notes (Addendum)
Pt endorses abd pain with fever, decreased intake x 3 days. Afebrile in lobby. VSS. Pt has DM type 2.

## 2019-04-27 LAB — URINE CULTURE: Culture: 50000 — AB

## 2019-04-27 LAB — NOVEL CORONAVIRUS, NAA (HOSP ORDER, SEND-OUT TO REF LAB; TAT 18-24 HRS): SARS-CoV-2, NAA: NOT DETECTED

## 2019-04-28 ENCOUNTER — Telehealth: Payer: Self-pay | Admitting: Emergency Medicine

## 2019-04-28 NOTE — Telephone Encounter (Signed)
Post ED Visit - Positive Culture Follow-up: Successful Patient Follow-Up  Culture assessed and recommendations reviewed by:  []  Elenor Quinones, Pharm.D. []  Heide Guile, Pharm.D., BCPS AQ-ID []  Parks Neptune, Pharm.D., BCPS []  Alycia Rossetti, Pharm.D., BCPS []  [place], [place].D., BCPS, AAHIVP []  Legrand Como, Pharm.D., BCPS, AAHIVP []  Salome Arnt, PharmD, BCPS []  Johnnette Gourd, PharmD, BCPS []  Hughes Better, PharmD, BCPS []  Leeroy Cha, PharmD Elicia Lamp PharmD  Positive urine culture  []  Patient discharged without antimicrobial prescription and treatment is now indicated []  Organism is resistant to prescribed ED discharge antimicrobial []  Patient with positive blood cultures  Changes discussed with ED provider: Irena Cords PA New antibiotic prescription symptom check if urinary symptoms, then stop Keflex and start Bactrim DS 1 tablet po bid x 3 days, if no urinary symptoms, no further treatment indicated  Attempting to contact patient   Hazle Nordmann 04/28/2019, 11:15 AM

## 2019-04-28 NOTE — Progress Notes (Signed)
ED Antimicrobial Stewardship Positive Culture Follow Up   Jennifer Crawford is an 43 y.o. female who presented to [facility] on 04/25/2019 with a chief complaint of  Chief Complaint  Patient presents with  . Abdominal Pain  . Fever    Recent Results (from the past 720 hour(s))  Novel Coronavirus,NAA,(SEND-OUT TO REF LAB - TAT 24-48 hrs); Hosp Order     Status: None   Collection Time: 04/25/19 10:34 AM   Specimen: Nasopharyngeal Swab; Respiratory  Result Value Ref Range Status   SARS-CoV-2, NAA NOT DETECTED NOT DETECTED Final    Comment: (NOTE) This test was developed and its performance characteristics determined by [employer]. This test has not been FDA cleared or approved. This test has been authorized by FDA under an Emergency Use Authorization (EUA). This test is only authorized for the duration of time the declaration that circumstances exist justifying the authorization of the emergency use of in vitro diagnostic tests for detection of SARS-CoV-2 virus and/or diagnosis of COVID-19 infection under section 564(b)(1) of the Act, 21 U.S.C. 517GYF-7(C)(9), unless the authorization is terminated or revoked sooner. When diagnostic testing is negative, the possibility of a false negative result should be considered in the context of a patient's recent exposures and the presence of clinical signs and symptoms consistent with COVID-19. An individual without symptoms of COVID-19 and who is not shedding SARS-CoV-2 virus would expect to have a negative (not detected) result in this assay. Performed  At: [facility] [address] [place], [place] [id number] Rush Farmer MD BW:[id number]    [place]  Final    Comment: Performed at [facility], [place] [address], [place], [place] [address]  Urine Culture     Status: Abnormal   Collection Time: 04/25/19 10:44 AM   Specimen: Urine, Catheterized  Result Value Ref Range Status   Specimen  Description URINE, CATHETERIZED  Final   Special Requests   Final    NONE Performed at [facility], [place] [address], [place], [place] [address]    Culture 50,000 COLONIES/mL STAPHYLOCOCCUS AUREUS (A)  Final   Report Status 04/27/2019 FINAL  Final   Organism ID, Bacteria STAPHYLOCOCCUS AUREUS (A)  Final      Susceptibility   Staphylococcus aureus - MIC*    CIPROFLOXACIN <=0.5 SENSITIVE Sensitive     GENTAMICIN <=0.5 SENSITIVE Sensitive     NITROFURANTOIN <=16 SENSITIVE Sensitive     OXACILLIN >=4 RESISTANT Resistant     TETRACYCLINE <=1 SENSITIVE Sensitive     VANCOMYCIN 1 SENSITIVE Sensitive     TRIMETH/SULFA <=10 SENSITIVE Sensitive     CLINDAMYCIN <=0.25 SENSITIVE Sensitive     RIFAMPIN <=0.5 SENSITIVE Sensitive     Inducible Clindamycin NEGATIVE Sensitive     * 50,000 COLONIES/mL STAPHYLOCOCCUS AUREUS    [x]  Treated with Keflex, organism resistant to prescribed antimicrobial  New antibiotic prescription: Flow Manager to call patient. If patient continues to experience no urinary symptoms (dysuria, etc.), then no further treatment indicated. If patient experiencing urinary symptoms, then stop Keflex and start Bactrim 1 DS tablet BID x 3 days.  ED Provider: Irena Cords, PA-C   [name] 04/28/2019, 8:50 AM Clinical Pharmacist Monday - Friday phone -  [phone] Saturday - Sunday phone - [phone]

## 2019-05-03 DIAGNOSIS — R10A1 Flank pain, right side: Secondary | ICD-10-CM

## 2019-05-03 DIAGNOSIS — R109 Unspecified abdominal pain: Secondary | ICD-10-CM

## 2019-05-03 DIAGNOSIS — E559 Vitamin D deficiency, unspecified: Secondary | ICD-10-CM

## 2019-05-03 HISTORY — DX: Vitamin D deficiency, unspecified: E55.9

## 2019-05-03 HISTORY — DX: Unspecified abdominal pain: R10.9

## 2019-05-03 HISTORY — DX: Flank pain, right side: R10.A1

## 2019-05-05 ENCOUNTER — Telehealth: Payer: Self-pay | Admitting: *Deleted

## 2019-05-05 NOTE — Telephone Encounter (Signed)
Post ED Visit - Positive Culture Follow-up  Culture report reviewed by antimicrobial stewardship pharmacist: [place] Team []  Elenor Quinones, Pharm.D. []  Heide Guile, Pharm.D., BCPS AQ-ID []  Parks Neptune, Pharm.D., BCPS []  Alycia Rossetti, Pharm.D., BCPS []  [place], Pharm.D., BCPS, AAHIVP []  Legrand Como, Pharm.D., BCPS, AAHIVP []  Salome Arnt, PharmD, BCPS []  Johnnette Gourd, PharmD, BCPS []  Hughes Better, PharmD, BCPS []  Leeroy Cha, PharmD []  Laqueta Linden, PharmD, BCPS []  Albertina Parr, PharmD  [place] Team []  Leodis Sias, PharmD []  Lindell Spar, PharmD []  Royetta Asal, PharmD []  Graylin Shiver, Rph []  Rema Fendt) Glennon Mac, PharmD []  Arlyn Dunning, PharmD []  Netta Cedars, PharmD []  Dia Sitter, PharmD []  Leone Haven, PharmD []  Gretta Arab, PharmD []  Theodis Shove, PharmD []  Peggyann Juba, PharmD []  Reuel Boom, PharmD   Positive urine culture Treated with Cephalexin.  Symptom check reveals patient is asymptomatic and no further patient follow-up is required at this time.  Harlon Flor [facility] 05/05/2019, 5:29 PM

## 2019-05-12 ENCOUNTER — Ambulatory Visit (HOSPITAL_COMMUNITY)
Admission: EM | Admit: 2019-05-12 | Discharge: 2019-05-12 | Disposition: A | Discharge status: Home / Self Care | Payer: Medicaid Other | Attending: Family Medicine | Admitting: Family Medicine

## 2019-05-12 ENCOUNTER — Other Ambulatory Visit: Payer: Self-pay

## 2019-05-12 ENCOUNTER — Encounter (HOSPITAL_COMMUNITY): Payer: Self-pay | Admitting: Emergency Medicine

## 2019-05-12 DIAGNOSIS — R319 Hematuria, unspecified: Secondary | ICD-10-CM | POA: Diagnosis present

## 2019-05-12 DIAGNOSIS — N10 Acute tubulo-interstitial nephritis: Secondary | ICD-10-CM | POA: Diagnosis not present

## 2019-05-12 LAB — POCT URINALYSIS DIP (DEVICE)
Bilirubin Urine: NEGATIVE
Glucose, UA: NEGATIVE mg/dL
Ketones, ur: NEGATIVE mg/dL
Leukocytes,Ua: NEGATIVE
Nitrite: NEGATIVE
Protein, ur: NEGATIVE mg/dL
Specific Gravity, Urine: >=1.03 (ref 1.005–1.030)
Urobilinogen, UA: 1 mg/dL (ref 0.0–1.0)
pH: 5 (ref 5.0–8.0)

## 2019-05-12 MED ORDER — HYDROCODONE-ACETAMINOPHEN 7.5-325 MG PO TABS: 1.0000 | ORAL_TABLET | Freq: Four times a day (QID) | ORAL | 0 refills | Status: DC | PRN

## 2019-05-12 MED ORDER — SULFAMETHOXAZOLE-TRIMETHOPRIM 800-160 MG PO TABS: 1.0000 | ORAL_TABLET | Freq: Two times a day (BID) | ORAL | 0 refills | Status: DC

## 2019-05-12 NOTE — ED Triage Notes (Signed)
Pt sts abd pain and sts recently had kidney infection and unsure if could be the same

## 2019-05-12 NOTE — Discharge Instructions (Signed)
Take the antibiotic 2 times a day Drink plenty of fluids Take ibuprofen as needed for moderate pain Take hydrocodone if needed for severe pain.  Take with food.  Do not drive while taking hydrocodone You should start to see improvement over the next couple of days Call or return if worse at any time instead of better

## 2019-05-12 NOTE — ED Provider Notes (Signed)
[facility]    CSN: [id number] Arrival date & time: 05/12/19  1410      History   Chief Complaint Chief Complaint  Patient presents with  . Abdominal Pain    HPI Jennifer SimmondsShontia Crawford is a 43 y.o. female.   HPI   Seen in ED about 2 weeks ago for kidney infection.  Treated with keflex.  Felt better a while then got worse again with abdominal and flank pain on the left. Decreased appetite.  Waves of nausea.  No vomiting.  Aches but no fever or chills.  Feels "weak".  Denies hematuria or dysuria Review of ED notes and labs reveals she had 50000 col of staph aureus, resistant to oxacillin.  It is possible that a MRSA might not be covered by keflex.   Past Medical History:  Diagnosis Date  . Anxiety and depression   . Diabetes mellitus without complication [facility](HCC)     Patient Active Problem List   Diagnosis Date Noted  . Diabetes mellitus type 2 in nonobese (HCC) 06/20/2015  . Elevated blood alcohol level 06/20/2015  . Anxiety and depression 06/20/2015  . Seizure (HCC) 06/19/2015    Past Surgical History:  Procedure Laterality Date  . TUBAL LIGATION      OB History   No obstetric history on file.      Home Medications    Prior to Admission medications   Medication Sig Start Date End Date Taking? Authorizing Provider  HYDROcodone-acetaminophen (NORCO) 7.5-325 MG tablet Take 1 tablet by mouth every 6 (six) hours as needed for moderate pain. 05/12/19   Eustace MooreNelson, Chayim Bialas Sue, MD  ibuprofen (ADVIL) 800 MG tablet Take 1 tablet (800 mg total) by mouth every 8 (eight) hours as needed for mild pain or moderate pain. 04/20/19   Ward, Chase PicketJaime Pilcher, PA-C  methocarbamol (ROBAXIN) 500 MG tablet Take 1 tablet (500 mg total) by mouth 2 (two) times daily as needed. 04/20/19   Ward, Chase PicketJaime Pilcher, PA-C  sulfamethoxazole-trimethoprim (BACTRIM DS) 800-160 MG tablet Take 1 tablet by mouth 2 (two) times daily for 10 days. 05/12/19 05/22/19  Eustace MooreNelson, Nieve Rojero Sue, MD    Family History  Family History  Problem Relation Age of Onset  . Seizures Daughter   . Asthma Son   . Hypertension Father   . Arrhythmia Father        Status post pacemaker insertion    Social History Social History   Tobacco Use  . Smoking status: Never Smoker  . Smokeless tobacco: Never Used  Substance Use Topics  . Alcohol use: Yes    Alcohol/week: 0.0 standard drinks    Comment: Occasional social  . Drug use: No     Allergies   Patient has no known allergies.   Review of Systems Review of Systems  Constitutional: Positive for fatigue. Negative for chills and fever.  HENT: Negative for ear pain and sore throat.   Eyes: Negative for pain and visual disturbance.  Respiratory: Negative for cough and shortness of breath.   Cardiovascular: Negative for chest pain and palpitations.  Gastrointestinal: Positive for abdominal pain and nausea. Negative for vomiting.  Genitourinary: Positive for flank pain. Negative for dysuria and hematuria.  Musculoskeletal: Negative for arthralgias and back pain.  Skin: Negative for color change and rash.  Neurological: Positive for weakness. Negative for seizures and syncope.  All other systems reviewed and are negative.    Physical Exam Triage Vital Signs ED Triage Vitals  Enc Vitals Group     BP  05/12/19 1436 129/81     Pulse Rate 05/12/19 1436 86     Resp 05/12/19 1436 16     Temp 05/12/19 1436 97.8 F (36.6 C)     Temp Source 05/12/19 1436 Temporal     SpO2 05/12/19 1436 100 %     Weight --      Height --      Head Circumference --      Peak Flow --      Pain Score 05/12/19 1445 5     Pain Loc --      Pain Edu? --      Excl. in GC? --    No data found.  Updated Vital Signs BP 129/81 (BP Location: Left Arm)   Pulse 86   Temp 97.8 F (36.6 C) (Temporal)   Resp 16   SpO2 100%      Physical Exam Constitutional:      General: She is not in acute distress.    Appearance: She is well-developed and normal weight. She is  ill-appearing.     Comments: Appears uncomfortable.  HENT:     Head: Normocephalic and atraumatic.     Mouth/Throat:     Mouth: Mucous membranes are moist.  Eyes:     Conjunctiva/sclera: Conjunctivae normal.     Pupils: Pupils are equal, round, and reactive to light.  Neck:     Musculoskeletal: Normal range of motion.  Cardiovascular:     Rate and Rhythm: Normal rate and regular rhythm.     Heart sounds: Normal heart sounds.  Pulmonary:     Effort: Pulmonary effort is normal. No respiratory distress.     Breath sounds: Normal breath sounds.  Abdominal:     General: Bowel sounds are normal. There is no distension.     Palpations: Abdomen is soft. There is no hepatomegaly, splenomegaly or mass.     Tenderness: There is abdominal tenderness in the left lower quadrant. There is left CVA tenderness.     Hernia: No hernia is present.  Musculoskeletal: Normal range of motion.  Skin:    General: Skin is warm and dry.  Neurological:     General: No focal deficit present.     Mental Status: She is alert.  Psychiatric:        Mood and Affect: Mood normal.        Behavior: Behavior normal.      UC Treatments / Results  Labs (all labs ordered are listed, but only abnormal results are displayed) Labs Reviewed  POCT URINALYSIS DIP (DEVICE) - Abnormal; Notable for the following components:      Result Value   Hgb urine dipstick MODERATE (*)    All other components within normal limits  URINE CULTURE    EKG   Radiology No results found.  Procedures Procedures (including critical care time)  Medications Ordered in UC Medications - No data to display  Initial Impression / Assessment and Plan / UC Course  I have reviewed the triage vital signs and the nursing notes.  Pertinent labs & imaging results that were available during my care of the patient were reviewed by me and considered in my medical decision making (see chart for details).     Will culture urine again.  May  be kidney infection or stone.  Is to follow up with PCP and see about urology. Final Clinical Impressions(s) / UC Diagnoses   Final diagnoses:  Acute pyelonephritis  Hematuria, unspecified type  Discharge Instructions     Take the antibiotic 2 times a day Drink plenty of fluids Take ibuprofen as needed for moderate pain Take hydrocodone if needed for severe pain.  Take with food.  Do not drive while taking hydrocodone You should start to see improvement over the next couple of days Call or return if worse at any time instead of better   ED Prescriptions    Medication Sig Dispense Auth. Provider   sulfamethoxazole-trimethoprim (BACTRIM DS) 800-160 MG tablet Take 1 tablet by mouth 2 (two) times daily for 10 days. 20 tablet Raylene Everts, MD   HYDROcodone-acetaminophen [facility]) 7.5-325 MG tablet Take 1 tablet by mouth every 6 (six) hours as needed for moderate pain. 15 tablet Raylene Everts, MD     Controlled Substance Prescriptions [place] Controlled Substance Registry consulted? Not Applicable   Raylene Everts, MD 05/12/19 [id number]

## 2019-05-13 ENCOUNTER — Encounter: Payer: Self-pay | Admitting: Family Medicine

## 2019-05-13 ENCOUNTER — Ambulatory Visit (INDEPENDENT_AMBULATORY_CARE_PROVIDER_SITE_OTHER): Payer: Medicaid Other | Admitting: Family Medicine

## 2019-05-13 VITALS — BP 114/76 | HR 75 | Temp 98.4°F | Ht 73.0 in | Wt 172.0 lb

## 2019-05-13 DIAGNOSIS — R10A1 Flank pain, right side: Secondary | ICD-10-CM | POA: Insufficient documentation

## 2019-05-13 DIAGNOSIS — Z131 Encounter for screening for diabetes mellitus: Secondary | ICD-10-CM | POA: Diagnosis not present

## 2019-05-13 DIAGNOSIS — R569 Unspecified convulsions: Secondary | ICD-10-CM

## 2019-05-13 DIAGNOSIS — Z7689 Persons encountering health services in other specified circumstances: Secondary | ICD-10-CM

## 2019-05-13 DIAGNOSIS — E119 Type 2 diabetes mellitus without complications: Secondary | ICD-10-CM | POA: Diagnosis not present

## 2019-05-13 DIAGNOSIS — Z09 Encounter for follow-up examination after completed treatment for conditions other than malignant neoplasm: Secondary | ICD-10-CM | POA: Diagnosis not present

## 2019-05-13 DIAGNOSIS — R1011 Right upper quadrant pain: Secondary | ICD-10-CM | POA: Insufficient documentation

## 2019-05-13 DIAGNOSIS — R51 Headache: Secondary | ICD-10-CM

## 2019-05-13 DIAGNOSIS — R109 Unspecified abdominal pain: Secondary | ICD-10-CM

## 2019-05-13 DIAGNOSIS — R519 Headache, unspecified: Secondary | ICD-10-CM | POA: Insufficient documentation

## 2019-05-13 LAB — POCT GLYCOSYLATED HEMOGLOBIN (HGB A1C): Hemoglobin A1C: 5.6 % (ref 4.0–5.6)

## 2019-05-13 LAB — URINE CULTURE

## 2019-05-13 MED ORDER — LEVETIRACETAM 1000 MG PO TABS: 1000.0000 mg | ORAL_TABLET | Freq: Two times a day (BID) | ORAL | 1 refills | Status: DC

## 2019-05-13 MED ORDER — IBUPROFEN 800 MG PO TABS: 800.0000 mg | ORAL_TABLET | Freq: Three times a day (TID) | ORAL | 1 refills | Status: DC | PRN

## 2019-05-13 MED ORDER — BLOOD GLUCOSE METER KIT: PACK | 0 refills | Status: AC

## 2019-05-13 NOTE — Progress Notes (Signed)
Patient [facility] Follow Up--New Patient--Establish Care  Subjective:  Patient ID: Jennifer Crawford, female    DOB: 23-Oct-1975  Age: 43 y.o. MRN: [id number]  CC:  Chief Complaint  Patient presents with   Establish Care    HPI Jennifer Crawford is a 43 year old female who presents for Hospital Follow Up and to Establish Care today.   Past Medical History:  Diagnosis Date   Anxiety and depression    Diabetes mellitus without complication ([place])    Right flank pain 05/2019   Right upper quadrant pain 8/22020   Seizures ([place]) 2015    Today will be Jennifer Crawford's initial office visit at our office. he has not had a PCP since a small child and has been getting medical attention via ED visit. Her last ED visit for Acute Pyelonephritis on 05/12/2019. She continues to have mid pain. She reports a long history of kidney problems. She has recently relocated from [place] to [place] 1 month ago.  He denies fatigue, frequent urination, blurred vision, excessive hunger, excessive thirst, weight gain, weight loss, and poor wound healing. He continues to check his feet regularly. She has a history of depression and anxiety. Her anxiety is mild today. She denies suicidal ideations, homicidal ideations, or auditory hallucinations.  She denies fevers, chills, fatigue, recent infections, weight loss, and night sweats. She has not had any headaches, visual changes, dizziness, and falls. No chest pain, heart palpitations, cough and shortness of breath reported. No reports of GI problems such as nausea, vomiting, diarrhea, and constipation. She has no reports of blood in stools, dysuria and hematuria. She denies pain today.   Past Surgical History:  Procedure Laterality Date   TUBAL LIGATION      Family History  Problem Relation Age of Onset   Seizures Daughter    Asthma Son    Hypertension Father    Arrhythmia Father        Status post  pacemaker insertion    Social History   Socioeconomic History   Marital status: Single    Spouse name: Not on file   Number of children: Not on file   Years of education: Not on file   Highest education level: Not on file  Occupational History   Not on file  Social Needs   Financial resource strain: Not on file   Food insecurity    Worry: Not on file    Inability: Not on file   Transportation needs    Medical: Not on file    Non-medical: Not on file  Tobacco Use   Smoking status: Never Smoker   Smokeless tobacco: Never Used  Substance and Sexual Activity   Alcohol use: Yes    Alcohol/week: 0.0 standard drinks    Comment: Occasional social   Drug use: No   Sexual activity: Not on file  Lifestyle   Physical activity    Days per week: Not on file    Minutes per session: Not on file   Stress: Not on file  Relationships   Social connections    Talks on phone: Not on file    Gets together: Not on file    Attends religious service: Not on file    Active member of club or organization: Not on file    Attends meetings of clubs or organizations: Not on file    Relationship status: Not on file   Intimate partner violence  Fear of current or ex partner: Not on file    Emotionally abused: Not on file    Physically abused: Not on file    Forced sexual activity: Not on file  Other Topics Concern   Not on file  Social History Narrative   Not on file    Outpatient Medications Prior to Visit  Medication Sig Dispense Refill   HYDROcodone-acetaminophen ([place]) 7.5-325 MG tablet Take 1 tablet by mouth every 6 (six) hours as needed for moderate pain. 15 tablet 0   sulfamethoxazole-trimethoprim (BACTRIM DS) 800-160 MG tablet Take 1 tablet by mouth 2 (two) times daily for 10 days. 20 tablet 0   methocarbamol (ROBAXIN) 500 MG tablet Take 1 tablet (500 mg total) by mouth 2 (two) times daily as needed. (Patient not taking: Reported on 05/13/2019) 20 tablet 0    ibuprofen (ADVIL) 800 MG tablet Take 1 tablet (800 mg total) by mouth every 8 (eight) hours as needed for mild pain or moderate pain. (Patient not taking: Reported on 05/13/2019) 21 tablet 0   No facility-administered medications prior to visit.     Allergies  Allergen Reactions   Tramadol Itching    Vaginal itching, per patient's original chart   Acetaminophen Rash    ROS Review of Systems  Constitutional: Negative.   HENT: Negative.   Eyes: Negative.   Respiratory: Positive for shortness of breath (occasional).   Cardiovascular: Negative.   Gastrointestinal: Negative.   Endocrine: Negative.   Genitourinary: Positive for urgency.       Frequency  Musculoskeletal: Positive for arthralgias (generalized pain).  Skin: Negative.   Allergic/Immunologic: Negative.   Neurological: Positive for dizziness (occasional ) and headaches (occasional).  Hematological: Negative.   Psychiatric/Behavioral: Negative.     Objective:    Physical Exam  Constitutional: She is oriented to person, place, and time. She appears well-developed and well-nourished.  HENT:  Head: Normocephalic and atraumatic.  Eyes: Conjunctivae are normal.  Neck: Normal range of motion. Neck supple.  Cardiovascular: Normal rate, regular rhythm, normal heart sounds and intact distal pulses.  Pulmonary/Chest: Effort normal and breath sounds normal.  Abdominal: Soft. Bowel sounds are normal.  Musculoskeletal: Normal range of motion.  Neurological: She is alert and oriented to person, place, and time. She has normal reflexes.  Skin: Skin is warm and dry.  Psychiatric: She has a normal mood and affect. Her behavior is normal. Judgment and thought content normal.  Nursing note and vitals reviewed.   BP 114/76 (BP Location: Left Arm, Patient Position: Sitting, Cuff Size: Small)    Pulse 75    Temp 98.4 F (36.9 C) (Oral)    Ht 6' 1" (1.854 m)    Wt 172 lb (78 kg)    SpO2 99%    BMI 22.69 kg/m  Wt Readings from Last 3  Encounters:  05/13/19 172 lb (78 kg)  04/25/19 175 lb (79.4 kg)  04/20/19 230 lb (104.3 kg)     Health Maintenance Due  Topic Date Due   PNEUMOCOCCAL POLYSACCHARIDE VACCINE AGE 22-64 HIGH RISK  09/01/1977   FOOT EXAM  09/01/1985   OPHTHALMOLOGY EXAM  09/01/1985   URINE MICROALBUMIN  09/01/1985   HIV Screening  09/01/1990   PAP SMEAR-Modifier  09/01/1996   HEMOGLOBIN A1C  12/18/2015   INFLUENZA VACCINE  05/03/2019    There are no preventive care reminders to display for this patient.  No results found for: TSH Lab Results  Component Value Date   WBC 5.5 04/25/2019  HGB 12.5 04/25/2019   HCT 37.6 04/25/2019   MCV 94.2 04/25/2019   PLT 177 04/25/2019   Lab Results  Component Value Date   NA 140 04/25/2019   K 3.7 04/25/2019   CO2 23 04/25/2019   GLUCOSE 96 04/25/2019   BUN 13 04/25/2019   CREATININE 0.92 04/25/2019   BILITOT 0.4 06/21/2015   ALKPHOS 74 06/21/2015   AST 34 06/21/2015   ALT 35 06/21/2015   PROT 6.6 06/21/2015   ALBUMIN 3.4 (L) 06/21/2015   CALCIUM 9.2 04/25/2019   ANIONGAP 9 04/25/2019   No results found for: CHOL No results found for: HDL No results found for: LDLCALC No results found for: TRIG No results found for: [facility] Lab Results  Component Value Date   HGBA1C 5.6 05/13/2019      Assessment & Plan:   1. Hospital discharge follow-up  2. Encounter to establish care  3. Seizure ([place]) - levETIRAcetam (KEPPRA) 1000 MG tablet; Take 1 tablet (1,000 mg total) by mouth 2 (two) times daily.  Dispense: 60 tablet; Refill: 1  4. Diabetes mellitus type 2 in nonobese (HCC) Hgb A1c is stable at 5.6 today. She will continue to decrease foods/beverages high in sugars and carbs and follow Heart Healthy or DASH diet. Increase physical activity to at least 30 minutes cardio exercise daily.  - Microalbumin, urine - Hepatic Function Panel - Lipid Panel - TSH - Vitamin D, 25-hydroxy - Vitamin B12 - blood glucose meter kit and supplies;  Dispense based on patient and insurance preference. Use up to four times daily as directed. (FOR ICD-10 E10.9, E11.9).  Dispense: 1 each; Refill: 0  5. Intractable headache, unspecified chronicity pattern, unspecified headache type - ibuprofen (ADVIL) 800 MG tablet; Take 1 tablet (800 mg total) by mouth every 8 (eight) hours as needed for mild pain or moderate pain.  Dispense: 30 tablet; Refill: 1  6. Right upper quadrant pain - US Abdomen Complete; Future  7. Right flank pain - US Abdomen Complete; Future  8. Screening for diabetes mellitus - POCT glycosylated hemoglobin (Hb A1C)  9. Follow up She will follow up in 1 month.      Meds ordered this encounter  Medications   blood glucose meter kit and supplies    Sig: Dispense based on patient and insurance preference. Use up to four times daily as directed. (FOR ICD-10 E10.9, E11.9).    Dispense:  1 each    Refill:  0    Order Specific Question:   Number of strips    Answer:   100    Order Specific Question:   Number of lancets    Answer:   100   ibuprofen (ADVIL) 800 MG tablet    Sig: Take 1 tablet (800 mg total) by mouth every 8 (eight) hours as needed for mild pain or moderate pain.    Dispense:  30 tablet    Refill:  1   levETIRAcetam (KEPPRA) 1000 MG tablet    Sig: Take 1 tablet (1,000 mg total) by mouth 2 (two) times daily.    Dispense:  60 tablet    Refill:  1    Orders Placed This Encounter  Procedures   US Abdomen Complete   Microalbumin, urine   Hepatic Function Panel   Lipid Panel   TSH   Vitamin D, 25-hydroxy   Vitamin B12   POCT glycosylated hemoglobin (Hb A1C)    Referral Orders  No referral(s) requested today    Kathe Becton,  MSN, FNP-BC [facility] [place] [address] [place], [place] [address] [phone] fax    Problem List Items Addressed This Visit      Endocrine   Diabetes mellitus type 2  in nonobese [facility])   Relevant Medications   blood glucose meter kit and supplies   Other Relevant Orders   Microalbumin, urine   Hepatic Function Panel   Lipid Panel   TSH   Vitamin D, 25-hydroxy   Vitamin B12     Other   Seizure (HCC)   Relevant Medications   levETIRAcetam (KEPPRA) 1000 MG tablet    Other Visit Diagnoses    Hospital discharge follow-up    -  Primary   Encounter to establish care       Intractable headache, unspecified chronicity pattern, unspecified headache type       Relevant Medications   ibuprofen (ADVIL) 800 MG tablet   levETIRAcetam (KEPPRA) 1000 MG tablet   Right upper quadrant pain       Relevant Orders   US Abdomen Complete   Right flank pain       Relevant Orders   US Abdomen Complete   Screening for diabetes mellitus       Relevant Orders   POCT glycosylated hemoglobin (Hb A1C) (Completed)   Follow up          Meds ordered this encounter  Medications   blood glucose meter kit and supplies    Sig: Dispense based on patient and insurance preference. Use up to four times daily as directed. (FOR ICD-10 E10.9, E11.9).    Dispense:  1 each    Refill:  0    Order Specific Question:   Number of strips    Answer:   100    Order Specific Question:   Number of lancets    Answer:   100   ibuprofen (ADVIL) 800 MG tablet    Sig: Take 1 tablet (800 mg total) by mouth every 8 (eight) hours as needed for mild pain or moderate pain.    Dispense:  30 tablet    Refill:  1   levETIRAcetam (KEPPRA) 1000 MG tablet    Sig: Take 1 tablet (1,000 mg total) by mouth 2 (two) times daily.    Dispense:  60 tablet    Refill:  1    Follow-up: No follow-ups on file.    Azzie Glatter, FNP

## 2019-05-14 LAB — LIPID PANEL
Chol/HDL Ratio: 3.1 ratio (ref 0.0–4.4)
Cholesterol, Total: 208 mg/dL — ABNORMAL HIGH (ref 100–199)
HDL: 67 mg/dL (ref >39)
LDL Calculated: 108 mg/dL — ABNORMAL HIGH (ref 0–99)
Triglycerides: 163 mg/dL — ABNORMAL HIGH (ref 0–149)
VLDL Cholesterol Cal: 33 mg/dL (ref 5–40)

## 2019-05-14 LAB — HEPATIC FUNCTION PANEL
ALT: 14 IU/L (ref 0–32)
AST: 13 IU/L (ref 0–40)
Albumin: 4.8 g/dL (ref 3.8–4.8)
Alkaline Phosphatase: 65 IU/L (ref 39–117)
Bilirubin Total: 0.4 mg/dL (ref 0.0–1.2)
Bilirubin, Direct: 0.12 mg/dL (ref 0.00–0.40)
Total Protein: 7.7 g/dL (ref 6.0–8.5)

## 2019-05-14 LAB — TSH: TSH: 1.1 u[IU]/mL (ref 0.450–4.500)

## 2019-05-14 LAB — VITAMIN D 25 HYDROXY (VIT D DEFICIENCY, FRACTURES): Vit D, 25-Hydroxy: 17.1 ng/mL — ABNORMAL LOW (ref 30.0–100.0)

## 2019-05-14 LAB — VITAMIN B12: Vitamin B-12: 487 pg/mL (ref 232–1245)

## 2019-05-14 LAB — MICROALBUMIN, URINE: Microalbumin, Urine: 5.6 ug/mL

## 2019-05-15 ENCOUNTER — Other Ambulatory Visit: Payer: Self-pay | Admitting: Family Medicine

## 2019-05-15 ENCOUNTER — Encounter: Payer: Self-pay | Admitting: Family Medicine

## 2019-05-15 DIAGNOSIS — E559 Vitamin D deficiency, unspecified: Secondary | ICD-10-CM

## 2019-05-15 MED ORDER — VITAMIN D (ERGOCALCIFEROL) 1.25 MG (50000 UNIT) PO CAPS: 50000.0000 [IU] | ORAL_CAPSULE | ORAL | 6 refills | Status: DC

## 2019-05-15 NOTE — Telephone Encounter (Signed)
-----   Message from Azzie Glatter, [place] sent at 05/13/2019 10:50 PM EDT ----- Regarding: "Ultrasound" Order placed for Abdominal US. Please get approval and inform patient. Thank you.

## 2019-05-15 NOTE — Telephone Encounter (Signed)
Patient has been scheduled for 05/19/2019 at 1015am. Patient has been notified

## 2019-05-19 ENCOUNTER — Ambulatory Visit (HOSPITAL_COMMUNITY): Payer: Medicaid Other | Attending: Family Medicine

## 2019-05-21 ENCOUNTER — Emergency Department (HOSPITAL_COMMUNITY)
Admission: EM | Admit: 2019-05-21 | Discharge: 2019-05-21 | Disposition: A | Discharge status: Home / Self Care | Payer: Medicaid Other | Attending: Emergency Medicine | Admitting: Emergency Medicine

## 2019-05-21 ENCOUNTER — Other Ambulatory Visit: Payer: Self-pay

## 2019-05-21 ENCOUNTER — Emergency Department (HOSPITAL_COMMUNITY): Payer: Medicaid Other

## 2019-05-21 DIAGNOSIS — R350 Frequency of micturition: Secondary | ICD-10-CM | POA: Insufficient documentation

## 2019-05-21 DIAGNOSIS — E11649 Type 2 diabetes mellitus with hypoglycemia without coma: Secondary | ICD-10-CM | POA: Diagnosis not present

## 2019-05-21 DIAGNOSIS — E119 Type 2 diabetes mellitus without complications: Secondary | ICD-10-CM | POA: Insufficient documentation

## 2019-05-21 DIAGNOSIS — G40909 Epilepsy, unspecified, not intractable, without status epilepticus: Secondary | ICD-10-CM | POA: Insufficient documentation

## 2019-05-21 DIAGNOSIS — R6883 Chills (without fever): Secondary | ICD-10-CM | POA: Diagnosis not present

## 2019-05-21 DIAGNOSIS — R1030 Lower abdominal pain, unspecified: Secondary | ICD-10-CM | POA: Diagnosis present

## 2019-05-21 DIAGNOSIS — R51 Headache: Secondary | ICD-10-CM | POA: Diagnosis not present

## 2019-05-21 DIAGNOSIS — R109 Unspecified abdominal pain: Secondary | ICD-10-CM

## 2019-05-21 LAB — URINALYSIS, ROUTINE W REFLEX MICROSCOPIC
Bacteria, UA: NONE SEEN
Bilirubin Urine: NEGATIVE
Glucose, UA: NEGATIVE mg/dL
Ketones, ur: NEGATIVE mg/dL
Leukocytes,Ua: NEGATIVE
Nitrite: NEGATIVE
Protein, ur: NEGATIVE mg/dL
Specific Gravity, Urine: 1.025 (ref 1.005–1.030)
pH: 5 (ref 5.0–8.0)

## 2019-05-21 LAB — CBC
HCT: 41.3 % (ref 36.0–46.0)
Hemoglobin: 13.4 g/dL (ref 12.0–15.0)
MCH: 31.3 pg (ref 26.0–34.0)
MCHC: 32.4 g/dL (ref 30.0–36.0)
MCV: 96.5 fL (ref 80.0–100.0)
Platelets: 194 10*3/uL (ref 150–400)
RBC: 4.28 MIL/uL (ref 3.87–5.11)
RDW: 12.9 % (ref 11.5–15.5)
WBC: 4.2 10*3/uL (ref 4.0–10.5)
nRBC: 0 % (ref 0.0–0.2)

## 2019-05-21 LAB — BASIC METABOLIC PANEL
Anion gap: 7 (ref 5–15)
BUN: 10 mg/dL (ref 6–20)
CO2: 23 mmol/L (ref 22–32)
Calcium: 9.3 mg/dL (ref 8.9–10.3)
Chloride: 108 mmol/L (ref 98–111)
Creatinine, Ser: 0.98 mg/dL (ref 0.44–1.00)
GFR calc Af Amer: >60 mL/min (ref >60)
GFR calc non Af Amer: >60 mL/min (ref >60)
Glucose, Bld: 85 mg/dL (ref 70–99)
Potassium: 3.7 mmol/L (ref 3.5–5.1)
Sodium: 138 mmol/L (ref 135–145)

## 2019-05-21 LAB — CBG MONITORING, ED
Glucose-Capillary: 50 mg/dL — ABNORMAL LOW (ref 70–99)
Glucose-Capillary: 67 mg/dL — ABNORMAL LOW (ref 70–99)
Glucose-Capillary: 52 mg/dL — ABNORMAL LOW (ref 70–99)
Glucose-Capillary: 96 mg/dL (ref 70–99)

## 2019-05-21 LAB — I-STAT BETA HCG BLOOD, ED (MC, WL, AP ONLY): I-stat hCG, quantitative: <5 m[IU]/mL (ref <5)

## 2019-05-21 MED ORDER — ACETAMINOPHEN 325 MG PO TABS
650.0000 mg | ORAL_TABLET | Freq: Once | ORAL | Status: AC
  Administered 2019-05-21: 650 mg via ORAL
  Filled 2019-05-21: qty 2

## 2019-05-21 MED ORDER — DEXTROSE 50 % IV SOLN
1.0000 | Freq: Once | INTRAVENOUS | Status: AC
  Administered 2019-05-21: 50 mL via INTRAVENOUS
  Filled 2019-05-21: qty 50

## 2019-05-21 MED ORDER — KETOROLAC TROMETHAMINE 30 MG/ML IJ SOLN
30.0000 mg | Freq: Once | INTRAMUSCULAR | Status: AC
  Administered 2019-05-21: 30 mg via INTRAMUSCULAR
  Filled 2019-05-21: qty 1

## 2019-05-21 MED ORDER — LIDOCAINE 5 % EX PTCH: 1.0000 | MEDICATED_PATCH | CUTANEOUS | 0 refills | Status: DC

## 2019-05-21 MED ORDER — NAPROXEN 500 MG PO TABS: 500.0000 mg | ORAL_TABLET | Freq: Two times a day (BID) | ORAL | 0 refills | Status: DC

## 2019-05-21 MED ORDER — CYCLOBENZAPRINE HCL 10 MG PO TABS: 10.0000 mg | ORAL_TABLET | Freq: Two times a day (BID) | ORAL | 0 refills | Status: DC | PRN

## 2019-05-21 MED ORDER — HYDROCODONE-ACETAMINOPHEN 5-325 MG PO TABS
1.0000 | ORAL_TABLET | Freq: Once | ORAL | Status: AC
  Administered 2019-05-21: 1 via ORAL
  Filled 2019-05-21: qty 1

## 2019-05-21 NOTE — ED Provider Notes (Signed)
[facility]ERGENCY DEPARTMENT Provider Note   CSN: [id number] Arrival date & time: 05/21/19  1438     History   Chief Complaint Chief Complaint  Patient presents with  . Flank Pain    HPI Jennifer Crawford is a 43 y.o. female with past medical history of seizure disorder on Keppra, type 2 diabetes, presenting to the emergency department with complaint of current left flank pain.  She states she has been having recurrence of the symptoms with associated UTI for multiple years.  She just established primary care who scheduled her an outpatient ultrasound for further evaluation, however she could not make the appointment.  She states she was recently treated for pyelonephritis and finished the Bactrim about 1 week ago.  She said she had some symptom relief for a couple days, however the flank pain returned with associated urinary urgency.  She has no dysuria or malodorous urine.  No nausea, vomiting, or fevers though does endorse some chills.  She states she has a little bit of a headache.  This usually happens when her blood sugar is low.  She was given a meal, graham crackers and orange juice in triage after she was noted to have low blood sugar.     The history is provided by the patient.    Past Medical History:  Diagnosis Date  . Anxiety and depression   . Diabetes mellitus without complication ([place])   . Right flank pain 05/2019  . Right upper quadrant pain 8/22020  . Seizures ([place]) 2015  . Vitamin D deficiency 05/2019    Patient Active Problem List   Diagnosis Date Noted  . Intractable headache 05/13/2019  . Right upper quadrant pain 05/13/2019  . Right flank pain 05/13/2019  . Diabetes mellitus type 2 in nonobese ([place]) 06/20/2015  . Elevated blood alcohol level 06/20/2015  . Anxiety and depression 06/20/2015  . Seizure ([place]) 06/19/2015    Past Surgical History:  Procedure Laterality Date  . TUBAL LIGATION       OB History   No obstetric history  on file.      Home Medications    Prior to Admission medications   Medication Sig Start Date End Date Taking? Authorizing Provider  blood glucose meter kit and supplies Dispense based on patient and insurance preference. Use up to four times daily as directed. (FOR ICD-10 E10.9, E11.9). 05/13/19  Yes Azzie Glatter, FNP  HYDROcodone-acetaminophen ([place]CO) 7.5-325 MG tablet Take 1 tablet by mouth every 6 (six) hours as needed for moderate pain. 05/12/19  Yes Raylene Everts, MD  ibuprofen (ADVIL) 800 MG tablet Take 1 tablet (800 mg total) by mouth every 8 (eight) hours as needed for mild pain or moderate pain. 05/13/19  Yes Azzie Glatter, FNP  levETIRAcetam (KEPPRA) 1000 MG tablet Take 1 tablet (1,000 mg total) by mouth 2 (two) times daily. 05/13/19  Yes Azzie Glatter, FNP  Vitamin D, Ergocalciferol, (DRISDOL) 1.25 MG (50000 UT) CAPS capsule Take 1 capsule (50,000 Units total) by mouth every 7 (seven) days. 05/15/19  Yes Azzie Glatter, FNP    Family History Family History  Problem Relation Age of Onset  . Seizures Daughter   . Asthma Son   . Hypertension Father   . Arrhythmia Father        Status post pacemaker insertion    Social History Social History   Tobacco Use  . Smoking status: Never Smoker  . Smokeless tobacco: Never Used  Substance Use  Topics  . Alcohol use: Yes    Alcohol/week: 0.0 standard drinks    Comment: Occasional social  . Drug use: No     Allergies   Tramadol and Acetaminophen   Review of Systems Review of Systems  Constitutional: Positive for chills. Negative for fever.  Gastrointestinal: Negative for abdominal pain, nausea and vomiting.  Genitourinary: Positive for flank pain and urgency. Negative for dysuria and frequency.  All other systems reviewed and are negative.    Physical Exam Updated Vital Signs BP 127/86   Pulse (!) 57   Temp 98.3 F (36.8 C) (Oral)   Resp 17   Ht 6' 1"  (1.854 m)   Wt 79.4 kg   SpO2 100%   BMI  23.09 kg/m   Physical Exam Vitals signs and nursing note reviewed.  Constitutional:      General: She is not in acute distress.    Appearance: She is well-developed. She is not ill-appearing.  HENT:     Head: Normocephalic and atraumatic.  Eyes:     Conjunctiva/sclera: Conjunctivae normal.  Cardiovascular:     Rate and Rhythm: Normal rate and regular rhythm.  Pulmonary:     Effort: Pulmonary effort is normal. No respiratory distress.     Breath sounds: Normal breath sounds.  Abdominal:     General: Bowel sounds are normal.     Palpations: Abdomen is soft.     Tenderness: There is abdominal tenderness in the suprapubic area and left lower quadrant. There is left CVA tenderness. There is no guarding or rebound.  Skin:    General: Skin is warm.  Neurological:     Mental Status: She is alert.  Psychiatric:        Mood and Affect: Mood normal.        Behavior: Behavior normal.      ED Treatments / Results  Labs (all labs ordered are listed, but only abnormal results are displayed) Labs Reviewed  URINALYSIS, ROUTINE W REFLEX MICROSCOPIC - Abnormal; Notable for the following components:      Result Value   APPearance CLOUDY (*)    Hgb urine dipstick MODERATE (*)    All other components within normal limits  CBG MONITORING, ED - Abnormal; Notable for the following components:   Glucose-Capillary 50 (*)    All other components within normal limits  CBG MONITORING, ED - Abnormal; Notable for the following components:   Glucose-Capillary 67 (*)    All other components within normal limits  CBG MONITORING, ED - Abnormal; Notable for the following components:   Glucose-Capillary 52 (*)    All other components within normal limits  URINE CULTURE  BASIC METABOLIC PANEL  CBC  I-STAT BETA HCG BLOOD, ED ([facility], [facility], [facility] ONLY)  CBG MONITORING, ED    EKG None  Radiology No results found.  Procedures Procedures (including critical care time)  Medications Ordered in ED  Medications  HYDROcodone-acetaminophen (NORCO/VICODIN) 5-325 MG per tablet 1 tablet (1 tablet Oral Given 05/21/19 1734)  ketorolac (TORADOL) 30 MG/ML injection 30 mg (30 mg Intramuscular Given 05/21/19 1827)  dextrose 50 % solution 50 mL (50 mLs Intravenous Given 05/21/19 1914)     Initial Impression / Assessment and Plan / ED Course  I have reviewed the triage vital signs and the nursing notes.  Pertinent labs & imaging results that were available during my care of the patient were reviewed by me and considered in my medical decision making (see chart for details).  Clinical Course as  of May 21 1955  Wed May 21, 2019  1835 Called in for seizure like activity. Pt witnessed having some very mild convulsions, though only lasting for a few seconds after I entered the room until she became conscious and responding. She states she is compliant with her keppra today. She states she did just have a seizure. I believe this is likely 2/t the hypogylcemia. She is given 1amp d50   [JR]  1928 Patient reevaluated once more, she states she feels still postictal.  She states she has multiple seizures per month and this is not abnormal for her.  She took her Keppra a little bit late in the afternoon today instead of the morning.  She took it at 2 PM.  She states this is happened in the past where she feels like she has a headache and her blood sugar is low and this causes her to have a seizure.  We will continue to monitor.   [JR]    Clinical Course User Index [JR] Sheyna Pettibone, [place] N, PA-C       Patient with history of recurrent left flank pain and UTI, presenting with the same.  Recently treated outpatient for pyelonephritis.  She had some symptom improvement after finishing Bactrim, however symptoms returned.  She has left flank pain with associated urinary urgency.  Labs are so far reassuring with the exception of hypoglycemia, treated with p.o. intake at first then 1 amp of D50. UA is negative for  infection though does have some blood.  Will proceed with CT stone study for further evaluation as she reports she has never had imaging of this recurrent symptoms.  Pending imaging, I was called into the room for seizure-like activity.  Patient appeared to be coming out of a seizure.  When she is more alert, she reports she took her Keppra late today, not until 2 PM.  She also states she was having some headache which is typical when she has low blood sugar.  This has caused her to have seizure similarly in the past.  This is not new for her.  Her CBG improved after D50.  Plan to recheck.  She is nearly out of her postictal on reevaluation prior to handoff at shift change to [place].  Plan to reevaluate, recheck CBG, and follow-up CT images.  Final Clinical Impressions(s) / ED Diagnoses   Final diagnoses:  None    ED Discharge Orders    None       Cordae Mccarey, [place] N, PA-C 05/21/19 2105    Veryl Speak, MD 05/24/19 [phone]

## 2019-05-21 NOTE — ED Triage Notes (Signed)
Pt here for evaluation of L flank pain x 2 days. Denies urinary symptoms, n/v. Endorses chills but no fever.

## 2019-05-21 NOTE — ED Notes (Signed)
Pt approached asking to have sugar checked.  CBG-was 50.  Pt provided happy meal with cheese, graham crackers, and OJ.  She is eating in triage currently.

## 2019-05-21 NOTE — Discharge Instructions (Signed)
Start taking naproxen twice daily with food.  You can take Flexeril as needed for muscle relaxation but do not drive, drink alcohol, operate heavy machinery because this medicine can cause drowsiness.  I usually recommend taking this medication only at night to help you get to sleep.  Apply lidocaine patches to areas of pain.  It is important to take your seizure medications as prescribed around the same time every day.  Drink plenty of fluids and get plenty of rest.  Eat your meals regularly.  2. Treatment: gentle stretching as discussed (see attached), alternate ice and heat (or stick with whichever feels best) 20 minutes on 20 minutes off. 3. Follow Up: Please followup with your primary doctor in 3-7 days for discussion of your diagnoses and further evaluation after today's visit; if you do not have a primary care doctor use the resource guide provided to find one;  Return to the ER for worsening back pain, difficulty walking, loss of bowel or bladder control or other concerning symptoms

## 2019-05-21 NOTE — ED Provider Notes (Signed)
[facility]  Refer to provider note for full history and physical examination.  Briefly, patient is a 43 year old female with history of seizures, type 2 diabetes presenting for evaluation of recurrent left flank pain.  Recently treated for pyelonephritis with some improvement in her symptoms which then recurred.  She has some urinary urgency but otherwise no other urinary symptoms.  UA is not suggestive of UTI but does have some hematuria so she is pending renal stone CT.  While in the ED she did have a breakthrough seizure, reports she has multiple seizures monthly.  She typically has them with a prodrome of a headache and some hypoglycemia and she was found to be hypoglycemic in the ED.  If CT scan is reassuring and she is tolerating p.o. with improved CBG she will likely be stable for discharge home.  Physical Exam  BP 127/86   Pulse (!) 57   Temp 98.3 F (36.8 C) (Oral)   Resp 17   Ht 6\' 1"  (1.854 m)   Wt 79.4 kg   SpO2 100%   BMI 23.09 kg/m   Physical Exam Vitals signs and nursing note reviewed.  Constitutional:      General: She is not in acute distress.    Appearance: She is well-developed.  HENT:     Head: Normocephalic and atraumatic.  Eyes:     General:        Right eye: No discharge.        Left eye: No discharge.     Conjunctiva/sclera: Conjunctivae normal.  Neck:     Vascular: No JVD.     Trachea: No tracheal deviation.  Cardiovascular:     Rate and Rhythm: Normal rate.  Pulmonary:     Effort: Pulmonary effort is normal.  Abdominal:     General: There is no distension.  Skin:    General: Skin is warm and dry.     Findings: No erythema.  Neurological:     Mental Status: She is alert.     Comments: Fluent speech, no facial droop, cranial appear grossly intact.  Moves all extremities spontaneously without difficulty.  Psychiatric:        Behavior: Behavior normal.     ED Course/Procedures   Clinical Course as of May 20 2038  Wed May 21, 2019  Nida [id number] Called in for seizure like activity. Pt witnessed having some very mild convulsions, though only lasting for a few seconds after I entered the room until she became conscious and responding. She states she is compliant with her keppra today. She states she did just have a seizure. I believe this is likely 2/t the hypogylcemia. She is given 1amp d50   [JR]  1928 Patient reevaluated once more, she states she feels still postictal.  She states she has multiple seizures per month and this is not abnormal for her.  She took her Keppra a little bit late in the afternoon today instead of the morning.  She took it at 2 PM.  She states this is happened in the past where she feels like she has a headache and her blood sugar is low and this causes her to have a seizure.  We will continue to monitor.   [JR]    Clinical Course User Index [JR] Robinson, [place] N, PA-C    Procedures  MDM  CT scan with no evidence of hydronephrosis, pyelonephritis, or obstructing uropathy.  On reevaluation patient resting comfortably no apparent distress.  She is tolerating p.o. without difficulty.  Her repeat CBG is improved.  She reports that she is feeling better though she does have some persistent flank pain.  Will discharge with naproxen and Flexeril, discussed appropriate use of medications for what could be musculoskeletal back pain.  Discussed conservative therapy and management with heat, ice, gentle stretching.  Recommend follow-up with PCP for reevaluation of symptoms.  Discussed strict ED return precautions. Pt verbalized understanding of and agreement with plan and is safe for discharge home at this time.       Renita Papa, PA-C 05/21/19 2236    Lennice Sites, DO 05/21/19 2355

## 2019-05-22 LAB — URINE CULTURE: Culture: <10000 — AB

## 2019-05-30 ENCOUNTER — Encounter: Payer: Self-pay | Admitting: Family Medicine

## 2019-05-30 ENCOUNTER — Ambulatory Visit (INDEPENDENT_AMBULATORY_CARE_PROVIDER_SITE_OTHER): Payer: Medicaid Other | Admitting: Family Medicine

## 2019-05-30 ENCOUNTER — Other Ambulatory Visit: Payer: Self-pay

## 2019-05-30 VITALS — BP 116/66 | HR 75 | Temp 98.6°F | Ht 73.0 in | Wt 174.8 lb

## 2019-05-30 DIAGNOSIS — Z09 Encounter for follow-up examination after completed treatment for conditions other than malignant neoplasm: Secondary | ICD-10-CM | POA: Diagnosis not present

## 2019-05-30 DIAGNOSIS — R109 Unspecified abdominal pain: Secondary | ICD-10-CM

## 2019-05-30 DIAGNOSIS — R10A2 Flank pain, left side: Secondary | ICD-10-CM

## 2019-05-30 DIAGNOSIS — M5440 Lumbago with sciatica, unspecified side: Secondary | ICD-10-CM

## 2019-05-30 DIAGNOSIS — R8290 Unspecified abnormal findings in urine: Secondary | ICD-10-CM | POA: Diagnosis not present

## 2019-05-30 LAB — POCT URINALYSIS DIPSTICK
Bilirubin, UA: NEGATIVE
Glucose, UA: NEGATIVE
Ketones, UA: NEGATIVE
Leukocytes, UA: NEGATIVE
Nitrite, UA: NEGATIVE
Protein, UA: NEGATIVE
Spec Grav, UA: >=1.03 — AB (ref 1.010–1.025)
Urobilinogen, UA: 1 E.U./dL
pH, UA: 5.5 (ref 5.0–8.0)

## 2019-05-30 MED ORDER — HYDROCODONE-ACETAMINOPHEN 7.5-325 MG PO TABS: 1.0000 | ORAL_TABLET | Freq: Four times a day (QID) | ORAL | 0 refills | Status: DC | PRN

## 2019-05-30 NOTE — Progress Notes (Signed)
Patient [facility] Follow Up  Subjective:  Patient ID: Jennifer Crawford, female    DOB: 12/31/1975  Age: 43 y.o. MRN: [id number]  CC:  Chief Complaint  Patient presents with  . Follow-up    having back pain , stomach on left side , pt when to ed on last week     HPI Jennifer Crawford is a 43 year old female who presents for Hospital Follow Up today.   Past Medical History:  Diagnosis Date  . Anxiety and depression   . Diabetes mellitus without complication ([place])   . Right flank pain 05/2019  . Right upper quadrant pain 8/22020  . Seizures ([place]) 2015  . Vitamin D deficiency 05/2019   Current Status: Since her last office visit, she has had an ED visit for Left Flank Pain on 05/21/2019. She completed CT ABD and Pelvis  that resulted negative. She continues to experience left upper quadrant pain and discomfort. No reports of GI problems such as nausea, vomiting, diarrhea, and constipation. She has no reports of blood in stools, dysuria and hematuria. She reports normal daily bowel movements. Her most recent normal range of preprandial blood glucose levels have been between 80-89. She has seen low range of 60 and high of 89 since his last office visit. She denies fatigue, frequent urination, blurred vision, excessive hunger, excessive thirst, weight gain, weight loss, and poor wound healing. She continues to check Her feet regularly. Her anxiety is high today. She denies suicidal ideations, homicidal ideations, or auditory hallucinations.  She denies fevers, chills, recent infections, weight loss, and night sweats. She has not had any headaches, visual changes, dizziness, and falls. No chest pain, heart palpitations, cough and shortness of breath reported. She denies pain today.   Past Surgical History:  Procedure Laterality Date  . TUBAL LIGATION      Family History  Problem Relation Age of Onset  . Seizures Daughter   . Asthma Son    . Hypertension Father   . Arrhythmia Father        Status post pacemaker insertion    Social History   Socioeconomic History  . Marital status: Single    Spouse name: Not on file  . Number of children: Not on file  . Years of education: Not on file  . Highest education level: Not on file  Occupational History  . Not on file  Social Needs  . Financial resource strain: Not on file  . Food insecurity    Worry: Not on file    Inability: Not on file  . Transportation needs    Medical: Not on file    Non-medical: Not on file  Tobacco Use  . Smoking status: Never Smoker  . Smokeless tobacco: Never Used  Substance and Sexual Activity  . Alcohol use: Yes    Alcohol/week: 0.0 standard drinks    Comment: Occasional social  . Drug use: No  . Sexual activity: Not on file  Lifestyle  . Physical activity    Days per week: Not on file    Minutes per session: Not on file  . Stress: Not on file  Relationships  . Social Herbalist on phone: Not on file    Gets together: Not on file    Attends religious service: Not on file    Active member of club or organization: Not on file    Attends meetings of clubs or organizations: Not on  file    Relationship status: Not on file  . Intimate partner violence    Fear of current or ex partner: Not on file    Emotionally abused: Not on file    Physically abused: Not on file    Forced sexual activity: Not on file  Other Topics Concern  . Not on file  Social History Narrative  . Not on file    Outpatient Medications Prior to Visit  Medication Sig Dispense Refill  . blood glucose meter kit and supplies Dispense based on patient and insurance preference. Use up to four times daily as directed. (FOR ICD-10 E10.9, E11.9). 1 each 0  . cyclobenzaprine (FLEXERIL) 10 MG tablet Take 1 tablet (10 mg total) by mouth 2 (two) times daily as needed. 10 tablet 0  . levETIRAcetam (KEPPRA) 1000 MG tablet Take 1 tablet (1,000 mg total) by mouth 2  (two) times daily. 60 tablet 1  . lidocaine (LIDODERM) 5 % Place 1 patch onto the skin daily. Remove & Discard patch within 12 hours or as directed by MD 30 patch 0  . naproxen (NAPROSYN) 500 MG tablet Take 1 tablet (500 mg total) by mouth 2 (two) times daily. 30 tablet 0  . Vitamin D, Ergocalciferol, (DRISDOL) 1.25 MG (50000 UT) CAPS capsule Take 1 capsule (50,000 Units total) by mouth every 7 (seven) days. 5 capsule 6  . HYDROcodone-acetaminophen (NORCO) 7.5-325 MG tablet Take 1 tablet by mouth every 6 (six) hours as needed for moderate pain. 15 tablet 0   No facility-administered medications prior to visit.     Allergies  Allergen Reactions  . Tramadol Itching    Vaginal itching, per patient's original chart  . Acetaminophen Rash    ROS Review of Systems  Constitutional: Negative.   HENT: Negative.   Eyes: Negative.   Respiratory: Negative.   Cardiovascular: Negative.   Gastrointestinal: Negative.   Endocrine: Negative.   Genitourinary: Negative.   Musculoskeletal: Negative.   Skin: Negative.   Allergic/Immunologic: Negative.   Neurological: Negative.   Hematological: Negative.   Psychiatric/Behavioral: Negative.       Objective:    Physical Exam  Constitutional: She is oriented to person, place, and time. She appears well-developed and well-nourished.  HENT:  Head: Normocephalic and atraumatic.  Eyes: Conjunctivae are normal.  Neck: Normal range of motion. Neck supple.  Cardiovascular: Normal rate, regular rhythm, normal heart sounds and intact distal pulses.  Pulmonary/Chest: Effort normal and breath sounds normal.  Abdominal: Soft. Bowel sounds are normal.  Musculoskeletal: Normal range of motion.  Neurological: She is alert and oriented to person, place, and time. She has normal reflexes.  Skin: Skin is warm and dry.  Psychiatric: She has a normal mood and affect. Her behavior is normal. Judgment and thought content normal.  Nursing note and vitals reviewed.    BP 116/66 (BP Location: Left Arm, Patient Position: Sitting, Cuff Size: Normal)   Pulse 75   Temp 98.6 F (37 C) (Oral)   Ht _0  (1.854 m)   Wt 174 lb 12.8 oz (79.3 kg)   BMI 23.06 kg/m  Wt Readings from Last 3 Encounters:  05/30/19 174 lb 12.8 oz (79.3 kg)  05/21/19 175 lb (79.4 kg)  05/13/19 172 lb (78 kg)     Health Maintenance Due  Topic Date Due  . FOOT EXAM  09/01/1985  . OPHTHALMOLOGY EXAM  09/01/1985  . HIV Screening  09/01/1990  . PAP SMEAR-Modifier  09/01/1996  . INFLUENZA VACCINE  05/03/2019  There are no preventive care reminders to display for this patient.  Lab Results  Component Value Date   TSH 1.100 05/13/2019   Lab Results  Component Value Date   WBC 4.2 05/21/2019   HGB 13.4 05/21/2019   HCT 41.3 05/21/2019   MCV 96.5 05/21/2019   PLT 194 05/21/2019   Lab Results  Component Value Date   NA 138 05/21/2019   K 3.7 05/21/2019   CO2 23 05/21/2019   GLUCOSE 85 05/21/2019   BUN 10 05/21/2019   CREATININE 0.98 05/21/2019   BILITOT 0.4 05/13/2019   ALKPHOS 65 05/13/2019   AST 13 05/13/2019   ALT 14 05/13/2019   PROT 7.7 05/13/2019   ALBUMIN 4.8 05/13/2019   CALCIUM 9.3 05/21/2019   ANIONGAP 7 05/21/2019   Lab Results  Component Value Date   CHOL 208 (H) 05/13/2019   Lab Results  Component Value Date   HDL 67 05/13/2019   Lab Results  Component Value Date   LDLCALC 108 (H) 05/13/2019   Lab Results  Component Value Date   TRIG 163 (H) 05/13/2019   Lab Results  Component Value Date   CHOLHDL 3.1 05/13/2019   Lab Results  Component Value Date   HGBA1C 5.6 05/13/2019      Assessment & Plan:   1. Hospital follow up  2. Low back pain with sciatica, sciatica laterality unspecified, unspecified back pain laterality, unspecified chronicity - POCT Urinalysis Dipstick  3. Left flank pain We will initiate one time refill on Hydrocortisone today.   4. Abnormal urinalysis Results are pending.  - Urine Culture  5. Follow  up Keep follow up appointment.   Meds ordered this encounter  Medications  . HYDROcodone-acetaminophen (NORCO) 7.5-325 MG tablet    Sig: Take 1 tablet by mouth every 6 (six) hours as needed for moderate pain.    Dispense:  30 tablet    Refill:  0    Order Specific Question:   Supervising Provider    Answer:   Tresa Garter [id number]    Orders Placed This Encounter  Procedures  . Urine Culture  . POCT Urinalysis Dipstick    Referral Orders  No referral(s) requested today    Kathe Becton,  MSN, FNP-BC [address], [place] [phone] fax  Problem List Items Addressed This Visit    None    Visit Diagnoses    Low back pain with sciatica, sciatica laterality unspecified, unspecified back pain laterality, unspecified chronicity    -  Primary   Relevant Medications   HYDROcodone-acetaminophen (NORCO) 7.5-325 MG tablet   Other Relevant Orders   POCT Urinalysis Dipstick (Completed)   Left flank pain       Abnormal urinalysis       Relevant Orders   Urine Culture   Follow up          Meds ordered this encounter  Medications  . HYDROcodone-acetaminophen (NORCO) 7.5-325 MG tablet    Sig: Take 1 tablet by mouth every 6 (six) hours as needed for moderate pain.    Dispense:  30 tablet    Refill:  0    Order Specific Question:   Supervising Provider    Answer:   Tresa Garter [id number]    Follow-up: No follow-ups on file.    Azzie Glatter, FNP

## 2019-05-31 DIAGNOSIS — R109 Unspecified abdominal pain: Secondary | ICD-10-CM | POA: Insufficient documentation

## 2019-05-31 DIAGNOSIS — R10A2 Flank pain, left side: Secondary | ICD-10-CM | POA: Insufficient documentation

## 2019-05-31 DIAGNOSIS — M5440 Lumbago with sciatica, unspecified side: Secondary | ICD-10-CM | POA: Insufficient documentation

## 2019-06-01 LAB — URINE CULTURE

## 2019-06-16 ENCOUNTER — Other Ambulatory Visit: Payer: Self-pay

## 2019-06-16 ENCOUNTER — Ambulatory Visit (INDEPENDENT_AMBULATORY_CARE_PROVIDER_SITE_OTHER): Payer: Medicaid Other | Admitting: Family Medicine

## 2019-06-16 VITALS — BP 120/74 | HR 69 | Temp 98.3°F | Ht 73.0 in | Wt 173.2 lb

## 2019-06-16 DIAGNOSIS — Z23 Encounter for immunization: Secondary | ICD-10-CM

## 2019-06-16 DIAGNOSIS — R109 Unspecified abdominal pain: Secondary | ICD-10-CM | POA: Diagnosis not present

## 2019-06-16 DIAGNOSIS — M5440 Lumbago with sciatica, unspecified side: Secondary | ICD-10-CM

## 2019-06-16 DIAGNOSIS — Z09 Encounter for follow-up examination after completed treatment for conditions other than malignant neoplasm: Secondary | ICD-10-CM

## 2019-06-16 DIAGNOSIS — R1010 Upper abdominal pain, unspecified: Secondary | ICD-10-CM

## 2019-06-16 DIAGNOSIS — R10A2 Flank pain, left side: Secondary | ICD-10-CM

## 2019-06-16 DIAGNOSIS — R8290 Unspecified abnormal findings in urine: Secondary | ICD-10-CM | POA: Diagnosis not present

## 2019-06-16 LAB — POCT URINALYSIS DIPSTICK
Bilirubin, UA: NEGATIVE
Glucose, UA: NEGATIVE
Ketones, UA: NEGATIVE
Leukocytes, UA: NEGATIVE
Nitrite, UA: NEGATIVE
Protein, UA: NEGATIVE
Spec Grav, UA: >=1.03 — AB (ref 1.010–1.025)
Urobilinogen, UA: 1 E.U./dL
pH, UA: 5.5 (ref 5.0–8.0)

## 2019-06-16 MED ORDER — IBUPROFEN 800 MG PO TABS: 800.0000 mg | ORAL_TABLET | Freq: Three times a day (TID) | ORAL | 3 refills | Status: DC | PRN

## 2019-06-16 NOTE — Progress Notes (Signed)
Patient [facility] Follow Up  Subjective:  Patient ID: Jennifer Crawford, female    DOB: 1976/07/28  Age: 43 y.o. MRN: [id number]  CC:  Chief Complaint  Patient presents with  . Follow-up    1 follow month, having back pain , lower back     HPI Jennifer Crawford is a 43 year old female who presents for Hospital Follow Up today.   Past Medical History:  Diagnosis Date  . Anxiety and depression   . Diabetes mellitus without complication ([place])   . Right flank pain 05/2019  . Right upper quadrant pain 8/22020  . Seizures ([place]) 2015  . Vitamin D deficiency 05/2019   Current Status: Since her last office visit, she has had an ED visit on 05/21/2019 for Left Flank Pheain. She is doing well with no complaints. He denies fatigue, frequent urination, blurred vision, excessive hunger, excessive thirst, weight gain, weight loss, and poor wound healing. She continues to check her feet regularly. Her anxiety is mild today. She denies suicidal ideations, homicidal ideations, or auditory hallucinations.  She denies fevers, chills, recent infections, weight loss, and night sweats. She has not had any headaches, dizziness, and falls. No chest pain, heart palpitations, cough and shortness of breath reported. No reports of GI problems such as nausea, vomiting, diarrhea, and constipation. She has no reports of blood in stools, dysuria and hematuria. She denies pain today.   Past Surgical History:  Procedure Laterality Date  . TUBAL LIGATION      Family History  Problem Relation Age of Onset  . Seizures Daughter   . Asthma Son   . Hypertension Father   . Arrhythmia Father        Status post pacemaker insertion    Social History   Socioeconomic History  . Marital status: Single    Spouse name: Not on file  . Number of children: Not on file  . Years of education: Not on file  . Highest education level: Not on file  Occupational History  .  Not on file  Social Needs  . Financial resource strain: Not on file  . Food insecurity    Worry: Not on file    Inability: Not on file  . Transportation needs    Medical: Not on file    Non-medical: Not on file  Tobacco Use  . Smoking status: Never Smoker  . Smokeless tobacco: Never Used  Substance and Sexual Activity  . Alcohol use: Yes    Alcohol/week: 0.0 standard drinks    Comment: Occasional social  . Drug use: No  . Sexual activity: Not on file  Lifestyle  . Physical activity    Days per week: Not on file    Minutes per session: Not on file  . Stress: Not on file  Relationships  . Social Herbalist on phone: Not on file    Gets together: Not on file    Attends religious service: Not on file    Active member of club or organization: Not on file    Attends meetings of clubs or organizations: Not on file    Relationship status: Not on file  . Intimate partner violence    Fear of current or ex partner: Not on file    Emotionally abused: Not on file    Physically abused: Not on file    Forced sexual activity: Not on file  Other Topics Concern  .  Not on file  Social History Narrative  . Not on file    Outpatient Medications Prior to Visit  Medication Sig Dispense Refill  . blood glucose meter kit and supplies Dispense based on patient and insurance preference. Use up to four times daily as directed. (FOR ICD-10 E10.9, E11.9). 1 each 0  . cyclobenzaprine (FLEXERIL) 10 MG tablet Take 1 tablet (10 mg total) by mouth 2 (two) times daily as needed. 10 tablet 0  . HYDROcodone-acetaminophen (NORCO) 7.5-325 MG tablet Take 1 tablet by mouth every 6 (six) hours as needed for moderate pain. 30 tablet 0  . levETIRAcetam (KEPPRA) 1000 MG tablet Take 1 tablet (1,000 mg total) by mouth 2 (two) times daily. 60 tablet 1  . naproxen (NAPROSYN) 500 MG tablet Take 1 tablet (500 mg total) by mouth 2 (two) times daily. 30 tablet 0  . Vitamin D, Ergocalciferol, (DRISDOL) 1.25 MG  (50000 UT) CAPS capsule Take 1 capsule (50,000 Units total) by mouth every 7 (seven) days. 5 capsule 6  . lidocaine (LIDODERM) 5 % Place 1 patch onto the skin daily. Remove & Discard patch within 12 hours or as directed by MD (Patient not taking: Reported on 06/16/2019) 30 patch 0   No facility-administered medications prior to visit.     Allergies  Allergen Reactions  . Tramadol Itching    Vaginal itching, per patient's original chart  . Acetaminophen Rash   ROS Review of Systems  Constitutional: Negative.   HENT: Negative.   Eyes: Negative.   Respiratory: Negative.   Cardiovascular: Negative.   Gastrointestinal: Negative.   Endocrine: Negative.   Genitourinary: Negative.   Musculoskeletal: Negative.   Allergic/Immunologic: Negative.   Neurological: Negative.   Hematological: Negative.   Psychiatric/Behavioral: Negative.       Objective:    Physical Exam  Constitutional: She is oriented to person, place, and time. She appears well-developed and well-nourished.  HENT:  Head: Normocephalic and atraumatic.  Eyes: Conjunctivae are normal.  Neck: Normal range of motion. Neck supple.  Cardiovascular: Normal rate, regular rhythm, normal heart sounds and intact distal pulses.  Pulmonary/Chest: Effort normal and breath sounds normal.  Abdominal: Soft.  Musculoskeletal: Normal range of motion.  Neurological: She is alert and oriented to person, place, and time. She has normal reflexes.  Skin: Skin is warm and dry.  Psychiatric: She has a normal mood and affect. Her behavior is normal. Judgment and thought content normal.  Nursing note and vitals reviewed.  BP 120/74 (BP Location: Left Arm, Patient Position: Sitting, Cuff Size: Normal)   Pulse 69   Temp 98.3 F (36.8 C) (Oral)   Ht 6' 1"  (1.854 m)   Wt 173 lb 3.2 oz (78.6 kg)   SpO2 100%   BMI 22.85 kg/m  Wt Readings from Last 3 Encounters:  06/16/19 173 lb 3.2 oz (78.6 kg)  05/30/19 174 lb 12.8 oz (79.3 kg)  05/21/19  175 lb (79.4 kg)    Health Maintenance Due  Topic Date Due  . FOOT EXAM  09/01/1985  . OPHTHALMOLOGY EXAM  09/01/1985  . HIV Screening  09/01/1990  . PAP SMEAR-Modifier  09/01/1996    There are no preventive care reminders to display for this patient.  Lab Results  Component Value Date   TSH 1.100 05/13/2019   Lab Results  Component Value Date   WBC 4.2 05/21/2019   HGB 13.4 05/21/2019   HCT 41.3 05/21/2019   MCV 96.5 05/21/2019   PLT 194 05/21/2019   Lab Results  Component Value Date   NA 138 05/21/2019   K 3.7 05/21/2019   CO2 23 05/21/2019   GLUCOSE 85 05/21/2019   BUN 10 05/21/2019   CREATININE 0.98 05/21/2019   BILITOT 0.4 05/13/2019   ALKPHOS 65 05/13/2019   AST 13 05/13/2019   ALT 14 05/13/2019   PROT 7.7 05/13/2019   ALBUMIN 4.8 05/13/2019   CALCIUM 9.3 05/21/2019   ANIONGAP 7 05/21/2019   Lab Results  Component Value Date   CHOL 208 (H) 05/13/2019   Lab Results  Component Value Date   HDL 67 05/13/2019   Lab Results  Component Value Date   LDLCALC 108 (H) 05/13/2019   Lab Results  Component Value Date   TRIG 163 (H) 05/13/2019   Lab Results  Component Value Date   CHOLHDL 3.1 05/13/2019   Lab Results  Component Value Date   HGBA1C 5.6 05/13/2019    Assessment & Plan:   1. Low back pain with sciatica, sciatica laterality unspecified, unspecified back pain laterality, unspecified chronicity - POCT Urinalysis Dipstick  2. Need for influenza vaccination - Flu Vaccine QUAD 6+ mos PF IM (Fluarix Quad PF)  3. Abnormal urinalysis Results are pending.  - Urine Culture  4. Left flank pain We will initiate Motrin today.  - ibuprofen (ADVIL) 800 MG tablet; Take 1 tablet (800 mg total) by mouth every 8 (eight) hours as needed.  Dispense: 30 tablet; Refill: 3 - US Abdomen Complete; Future  5. Pain of upper abdomen Patient previously scheduled for US Abdomen on 05/19/2019 to further evaluate abdominal pain, but she 'no-showed.' We will  reschedule Ultrasound today. - US Abdomen Complete; Future  6. Follow up Patient left office before making follow up in 3 months.   Meds ordered this encounter  Medications  . ibuprofen (ADVIL) 800 MG tablet    Sig: Take 1 tablet (800 mg total) by mouth every 8 (eight) hours as needed.    Dispense:  30 tablet    Refill:  3    Orders Placed This Encounter  Procedures  . Urine Culture  . US Abdomen Complete  . Flu Vaccine QUAD 6+ mos PF IM (Fluarix Quad PF)  . POCT Urinalysis Dipstick    Referral Orders  No referral(s) requested today    Kathe Becton,  MSN, FNP-BC [address], [place] [address] [phone] fax  Problem List Items Addressed This Visit      Nervous and Auditory   Low back pain with sciatica - Primary   Relevant Medications   ibuprofen (ADVIL) 800 MG tablet   Other Relevant Orders   POCT Urinalysis Dipstick (Completed)     Other   Left flank pain   Relevant Medications   ibuprofen (ADVIL) 800 MG tablet   Other Relevant Orders   US Abdomen Complete    Other Visit Diagnoses    Need for influenza vaccination       Relevant Orders   Flu Vaccine QUAD 6+ mos PF IM (Fluarix Quad PF) (Completed)   Abnormal urinalysis       Relevant Orders   Urine Culture   Pain of upper abdomen       Relevant Orders   US Abdomen Complete   Follow up          Meds ordered this encounter  Medications  . ibuprofen (ADVIL) 800 MG tablet    Sig: Take 1 tablet (800 mg  total) by mouth every 8 (eight) hours as needed.    Dispense:  30 tablet    Refill:  3    Follow-up: Return in about 3 months (around 09/15/2019).    Azzie Glatter, FNP

## 2019-06-16 NOTE — Patient Instructions (Signed)
Flank Pain, Adult Flank pain is pain in your side. The flank is the area of your side between your upper belly (abdomen) and your back. The pain may occur over a short time (acute), or it may be long-term or come back often (chronic). It may be mild or very bad. Pain in this area can be caused by many different things. Follow these instructions at home:   Drink enough fluid to keep your pee (urine) clear or pale yellow.  Rest as told by your doctor.  Take over-the-counter and prescription medicines only as told by your doctor.  Keep a journal to keep track of: ? What has caused your flank pain. ? What has made it feel better.  Keep all follow-up visits as told by your doctor. This is important. Contact a doctor if:  Medicine does not help your pain.  You have new symptoms.  Your pain gets worse.  You have a fever.  Your symptoms last longer than 2-3 days.  You have trouble peeing.  You are peeing more often than normal. Get help right away if:  You have trouble breathing.  You are short of breath.  Your belly hurts, or it is swollen or red.  You feel sick to your stomach (nauseous).  You throw up (vomit).  You feel like you will pass out, or you do pass out (faint).  You have blood in your pee. Summary  Flank pain is pain in your side. The flank is the area of your side between your upper belly (abdomen) and your back.  Flank pain may occur over a short time (acute), or it may be long-term or come back often (chronic). It may be mild or very bad.  Pain in this area can be caused by many different things.  Contact your doctor if your symptoms get worse or they last longer than 2-3 days. This information is not intended to replace advice given to you by your health care provider. Make sure you discuss any questions you have with your health care provider. Document Released: 06/27/2008 Document Revised: 08/31/2017 Document Reviewed: 01/08/2017 Elsevier Patient  Education  2020 Else[employer]. Ibuprofen tablets and capsules What is this medicine? IBUPROFEN (eye BYOO proe fen) is a non-steroidal anti-inflammatory drug (NSAID). It is used for dental pain, fever, headaches or migraines, osteoarthritis, rheumatoid arthritis, or painful monthly periods. It can also relieve minor aches and pains caused by a cold, flu, or sore throat. This medicine may be used for other purposes; ask your health care provider or pharmacist if you have questions. COMMON BRAND NAME(S): Advil, Advil Junior Strength, Advil Migraine, Genpril, Ibren, IBU, Midol, Midol Cramps and Body Aches, Motrin, Motrin IB, Motrin Junior Strength, Motrin Migraine Pain, Samson-8, Toxicology Saliva Collection What should I tell my health care provider before I take this medicine? They need to know if you have any of these conditions:  cigarette smoker  coronary artery bypass graft (CABG) surgery within the past 2 weeks  drink more than 3 alcohol-containing drinks a day  heart disease  high blood pressure  history of stomach bleeding  kidney disease  liver disease  lung or breathing disease, like asthma  an unusual or allergic reaction to ibuprofen, aspirin, other NSAIDs, other medicines, foods, dyes, or preservatives  pregnant or trying to get pregnant  breast-feeding How should I use this medicine? Take this medicine by mouth with a glass of water. Follow the directions on the prescription label. Take this medicine with food if your  stomach gets upset. Try to not lie down for at least 10 minutes after you take the medicine. Take your medicine at regular intervals. Do not take your medicine more often than directed. A special MedGuide will be given to you by the pharmacist with each prescription and refill. Be sure to read this information carefully each time. Talk to your pediatrician regarding the use of this medicine in children. Special care may be needed. Overdosage: If you think  you have taken too much of this medicine contact a poison control center or emergency room at once. NOTE: This medicine is only for you. Do not share this medicine with others. What if I miss a dose? If you miss a dose, take it as soon as you can. If it is almost time for your next dose, take only that dose. Do not take double or extra doses. What may interact with this medicine? Do not take this medicine with any of the following medications:  cidofovir  ketorolac  methotrexate  pemetrexed This medicine may also interact with the following medications:  alcohol  aspirin  diuretics  lithium  other drugs for inflammation like prednisone  warfarin This list may not describe all possible interactions. Give your health care provider a list of all the medicines, herbs, non-prescription drugs, or dietary supplements you use. Also tell them if you smoke, drink alcohol, or use illegal drugs. Some items may interact with your medicine. What should I watch for while using this medicine? Tell your doctor or healthcare provider if your symptoms do not start to get better or if they get worse. This medicine may cause serious skin reactions. They can happen weeks to months after starting the medicine. Contact your healthcare provider right away if you notice fevers or flu-like symptoms with a rash. The rash may be red or purple and then turn into blisters or peeling of the skin. Or, you might notice a red rash with swelling of the face, lips or lymph nodes in your neck or under your arms. This medicine does not prevent heart attack or stroke. In fact, this medicine may increase the chance of a heart attack or stroke. The chance may increase with longer use of this medicine and in people who have heart disease. If you take aspirin to prevent heart attack or stroke, talk with your doctor or healthcare provider. Do not take other medicines that contain aspirin, ibuprofen, or naproxen with this  medicine. Side effects such as stomach upset, nausea, or ulcers may be more likely to occur. Many medicines available without a prescription should not be taken with this medicine. This medicine can cause ulcers and bleeding in the stomach and intestines at any time during treatment. Ulcers and bleeding can happen without warning symptoms and can cause death. To reduce your risk, do not smoke cigarettes or drink alcohol while you are taking this medicine. You may get drowsy or dizzy. Do not drive, use machinery, or do anything that needs mental alertness until you know how this medicine affects you. Do not stand or sit up quickly, especially if you are an older patient. This reduces the risk of dizzy or fainting spells. This medicine can cause you to bleed more easily. Try to avoid damage to your teeth and gums when you brush or floss your teeth. This medicine may be used to treat migraines. If you take migraine medicines for 10 or more days a month, your migraines may get worse. Keep a diary of headache days  and medicine use. Contact your healthcare provider if your migraine attacks occur more frequently. What side effects may I notice from receiving this medicine? Side effects that you should report to your doctor or health care professional as soon as possible:  allergic reactions like skin rash, itching or hives, swelling of the face, lips, or tongue  redness, blistering, peeling or loosening of the skin, including inside the mouth  severe stomach pain  signs and symptoms of bleeding such as bloody or black, tarry stools; red or dark-brown urine; spitting up blood or brown material that looks like coffee grounds; red spots on the skin; unusual bruising or bleeding from the eye, gums, or nose  signs and symptoms of a blood clot such as changes in vision; chest pain; severe, sudden headache; trouble speaking; sudden numbness or weakness of the face, arm, or leg  unexplained weight gain or  swelling  unusually weak or tired  yellowing of eyes or skin Side effects that usually do not require medical attention (report to your doctor or health care professional if they continue or are bothersome):  bruising  diarrhea  dizziness, drowsiness  headache  nausea, vomiting This list may not describe all possible side effects. Call your doctor for medical advice about side effects. You may report side effects to FDA at 1-800-FDA-1088. Where should I keep my medicine? Keep out of the reach of children. Store at room temperature between 15 and 30 degrees C (59 and 86 degrees F). Keep container tightly closed. Throw away any unused medicine after the expiration date. NOTE: This sheet is a summary. It may not cover all possible information. If you have questions about this medicine, talk to your doctor, pharmacist, or health care provider.  2020 Elsevier/Gold Standard (2018-12-04 14:11:00)

## 2019-06-18 LAB — URINE CULTURE: Organism ID, Bacteria: NO GROWTH

## 2019-06-26 ENCOUNTER — Observation Stay (HOSPITAL_COMMUNITY)
Admission: EM | Admit: 2019-06-26 | Discharge: 2019-06-28 | Disposition: A | Discharge status: Home / Self Care | Payer: Medicaid Other | Attending: Internal Medicine | Admitting: Internal Medicine

## 2019-06-26 ENCOUNTER — Other Ambulatory Visit: Payer: Self-pay

## 2019-06-26 ENCOUNTER — Encounter (HOSPITAL_COMMUNITY): Payer: Self-pay | Admitting: Emergency Medicine

## 2019-06-26 DIAGNOSIS — Z79899 Other long term (current) drug therapy: Secondary | ICD-10-CM | POA: Insufficient documentation

## 2019-06-26 DIAGNOSIS — F419 Anxiety disorder, unspecified: Secondary | ICD-10-CM | POA: Diagnosis not present

## 2019-06-26 DIAGNOSIS — F329 Major depressive disorder, single episode, unspecified: Secondary | ICD-10-CM | POA: Diagnosis not present

## 2019-06-26 DIAGNOSIS — E162 Hypoglycemia, unspecified: Secondary | ICD-10-CM | POA: Diagnosis present

## 2019-06-26 DIAGNOSIS — Z0184 Encounter for antibody response examination: Secondary | ICD-10-CM | POA: Diagnosis not present

## 2019-06-26 DIAGNOSIS — E11649 Type 2 diabetes mellitus with hypoglycemia without coma: Principal | ICD-10-CM | POA: Insufficient documentation

## 2019-06-26 DIAGNOSIS — Z791 Long term (current) use of non-steroidal anti-inflammatories (NSAID): Secondary | ICD-10-CM | POA: Insufficient documentation

## 2019-06-26 DIAGNOSIS — G40909 Epilepsy, unspecified, not intractable, without status epilepticus: Secondary | ICD-10-CM | POA: Insufficient documentation

## 2019-06-26 DIAGNOSIS — Z886 Allergy status to analgesic agent status: Secondary | ICD-10-CM | POA: Diagnosis not present

## 2019-06-26 DIAGNOSIS — Z20828 Contact with and (suspected) exposure to other viral communicable diseases: Secondary | ICD-10-CM | POA: Diagnosis not present

## 2019-06-26 DIAGNOSIS — F32A Depression, unspecified: Secondary | ICD-10-CM | POA: Diagnosis present

## 2019-06-26 DIAGNOSIS — Z820 Family history of epilepsy and other diseases of the nervous system: Secondary | ICD-10-CM | POA: Diagnosis not present

## 2019-06-26 DIAGNOSIS — Z7984 Long term (current) use of oral hypoglycemic drugs: Secondary | ICD-10-CM | POA: Diagnosis not present

## 2019-06-26 DIAGNOSIS — R109 Unspecified abdominal pain: Secondary | ICD-10-CM | POA: Diagnosis not present

## 2019-06-26 DIAGNOSIS — Z885 Allergy status to narcotic agent status: Secondary | ICD-10-CM | POA: Diagnosis not present

## 2019-06-26 DIAGNOSIS — R310 Gross hematuria: Secondary | ICD-10-CM | POA: Insufficient documentation

## 2019-06-26 DIAGNOSIS — E119 Type 2 diabetes mellitus without complications: Secondary | ICD-10-CM

## 2019-06-26 DIAGNOSIS — R319 Hematuria, unspecified: Secondary | ICD-10-CM | POA: Diagnosis present

## 2019-06-26 LAB — CBG MONITORING, ED: Glucose-Capillary: 53 mg/dL — ABNORMAL LOW (ref 70–99)

## 2019-06-26 NOTE — ED Triage Notes (Signed)
Pt reports generalized weakness since yesterday, also endorses urinary frequency with some blood noted. Denies n/v/d, does endorse chills.

## 2019-06-26 NOTE — ED Notes (Signed)
cbg 53 in triage, pt given orange juice and graham crackers.

## 2019-06-27 ENCOUNTER — Encounter (HOSPITAL_COMMUNITY): Payer: Self-pay | Admitting: Family Medicine

## 2019-06-27 DIAGNOSIS — E162 Hypoglycemia, unspecified: Secondary | ICD-10-CM | POA: Diagnosis present

## 2019-06-27 DIAGNOSIS — G40909 Epilepsy, unspecified, not intractable, without status epilepticus: Secondary | ICD-10-CM | POA: Diagnosis not present

## 2019-06-27 DIAGNOSIS — F419 Anxiety disorder, unspecified: Secondary | ICD-10-CM

## 2019-06-27 DIAGNOSIS — R319 Hematuria, unspecified: Secondary | ICD-10-CM | POA: Diagnosis present

## 2019-06-27 DIAGNOSIS — F329 Major depressive disorder, single episode, unspecified: Secondary | ICD-10-CM

## 2019-06-27 DIAGNOSIS — E119 Type 2 diabetes mellitus without complications: Secondary | ICD-10-CM | POA: Diagnosis not present

## 2019-06-27 LAB — BASIC METABOLIC PANEL
Anion gap: 11 (ref 5–15)
BUN: 15 mg/dL (ref 6–20)
CO2: 22 mmol/L (ref 22–32)
Calcium: 9.6 mg/dL (ref 8.9–10.3)
Chloride: 103 mmol/L (ref 98–111)
Creatinine, Ser: 0.86 mg/dL (ref 0.44–1.00)
GFR calc Af Amer: >60 mL/min (ref >60)
GFR calc non Af Amer: >60 mL/min (ref >60)
Glucose, Bld: 87 mg/dL (ref 70–99)
Potassium: 3.7 mmol/L (ref 3.5–5.1)
Sodium: 136 mmol/L (ref 135–145)

## 2019-06-27 LAB — URINALYSIS, ROUTINE W REFLEX MICROSCOPIC
Bacteria, UA: NONE SEEN
Glucose, UA: NEGATIVE mg/dL
Ketones, ur: NEGATIVE mg/dL
Leukocytes,Ua: NEGATIVE
Nitrite: NEGATIVE
Protein, ur: 30 mg/dL — AB
Specific Gravity, Urine: 1.035 — ABNORMAL HIGH (ref 1.005–1.030)
pH: 5 (ref 5.0–8.0)

## 2019-06-27 LAB — GLUCOSE, CAPILLARY
Glucose-Capillary: 70 mg/dL (ref 70–99)
Glucose-Capillary: 100 mg/dL — ABNORMAL HIGH (ref 70–99)
Glucose-Capillary: 85 mg/dL (ref 70–99)
Glucose-Capillary: 102 mg/dL — ABNORMAL HIGH (ref 70–99)

## 2019-06-27 LAB — I-STAT BETA HCG BLOOD, ED (MC, WL, AP ONLY): I-stat hCG, quantitative: <5 m[IU]/mL (ref <5)

## 2019-06-27 LAB — CBC
HCT: 39.5 % (ref 36.0–46.0)
Hemoglobin: 13.3 g/dL (ref 12.0–15.0)
MCH: 32.2 pg (ref 26.0–34.0)
MCHC: 33.7 g/dL (ref 30.0–36.0)
MCV: 95.6 fL (ref 80.0–100.0)
Platelets: 209 10*3/uL (ref 150–400)
RBC: 4.13 MIL/uL (ref 3.87–5.11)
RDW: 12.9 % (ref 11.5–15.5)
WBC: 5.9 10*3/uL (ref 4.0–10.5)
nRBC: 0 % (ref 0.0–0.2)

## 2019-06-27 LAB — HEPATIC FUNCTION PANEL
ALT: 19 U/L (ref 0–44)
AST: 24 U/L (ref 15–41)
Albumin: 4.1 g/dL (ref 3.5–5.0)
Alkaline Phosphatase: 54 U/L (ref 38–126)
Bilirubin, Direct: <0.1 mg/dL (ref 0.0–0.2)
Total Bilirubin: 0.7 mg/dL (ref 0.3–1.2)
Total Protein: 7.6 g/dL (ref 6.5–8.1)

## 2019-06-27 LAB — URINE CULTURE: Culture: <10000 — AB

## 2019-06-27 LAB — CBG MONITORING, ED
Glucose-Capillary: 113 mg/dL — ABNORMAL HIGH (ref 70–99)
Glucose-Capillary: 132 mg/dL — ABNORMAL HIGH (ref 70–99)
Glucose-Capillary: 55 mg/dL — ABNORMAL LOW (ref 70–99)
Glucose-Capillary: 59 mg/dL — ABNORMAL LOW (ref 70–99)
Glucose-Capillary: 63 mg/dL — ABNORMAL LOW (ref 70–99)
Glucose-Capillary: 65 mg/dL — ABNORMAL LOW (ref 70–99)
Glucose-Capillary: 78 mg/dL (ref 70–99)
Glucose-Capillary: 81 mg/dL (ref 70–99)
Glucose-Capillary: 83 mg/dL (ref 70–99)

## 2019-06-27 LAB — CORTISOL: Cortisol, Plasma: 1.6 ug/dL

## 2019-06-27 LAB — HEMOGLOBIN A1C
Hgb A1c MFr Bld: 5.6 % (ref 4.8–5.6)
Mean Plasma Glucose: 114.02 mg/dL

## 2019-06-27 LAB — BETA-HYDROXYBUTYRIC ACID: Beta-Hydroxybutyric Acid: 0.13 mmol/L (ref 0.05–0.27)

## 2019-06-27 LAB — ETHANOL: Alcohol, Ethyl (B): <10 mg/dL (ref <10)

## 2019-06-27 LAB — RAPID URINE DRUG SCREEN, HOSP PERFORMED
Amphetamines: NOT DETECTED
Barbiturates: NOT DETECTED
Benzodiazepines: NOT DETECTED
Cocaine: NOT DETECTED
Opiates: POSITIVE — AB
Tetrahydrocannabinol: NOT DETECTED

## 2019-06-27 LAB — SARS CORONAVIRUS 2 (TAT 6-24 HRS): SARS Coronavirus 2: NEGATIVE

## 2019-06-27 MED ORDER — LEVETIRACETAM 500 MG PO TABS
1000.0000 mg | ORAL_TABLET | Freq: Two times a day (BID) | ORAL | Status: DC
  Administered 2019-06-27 – 2019-06-28 (×3): 1000 mg via ORAL
  Filled 2019-06-27 (×3): qty 2

## 2019-06-27 MED ORDER — ONDANSETRON HCL 4 MG/2ML IJ SOLN: 4.0000 mg | Freq: Four times a day (QID) | INTRAMUSCULAR | Status: DC | PRN

## 2019-06-27 MED ORDER — DEXTROSE-NACL 5-0.45 % IV SOLN
INTRAVENOUS | Status: DC
  Administered 2019-06-27: 08:00:00 via INTRAVENOUS

## 2019-06-27 MED ORDER — DICLOFENAC SODIUM 1 % TD GEL
2.0000 g | Freq: Four times a day (QID) | TRANSDERMAL | Status: DC
  Administered 2019-06-27 – 2019-06-28 (×3): 2 g via TOPICAL
  Filled 2019-06-27: qty 100

## 2019-06-27 MED ORDER — SODIUM CHLORIDE 0.9% FLUSH
3.0000 mL | Freq: Two times a day (BID) | INTRAVENOUS | Status: DC
  Administered 2019-06-27 – 2019-06-28 (×2): 3 mL via INTRAVENOUS

## 2019-06-27 MED ORDER — DEXTROSE-NACL 5-0.9 % IV SOLN
Freq: Once | INTRAVENOUS | Status: AC
  Administered 2019-06-27: 05:00:00 via INTRAVENOUS

## 2019-06-27 MED ORDER — DEXTROSE 50 % IV SOLN
25.0000 mL | Freq: Once | INTRAVENOUS | Status: AC
  Administered 2019-06-27: 02:00:00 25 mL via INTRAVENOUS

## 2019-06-27 MED ORDER — DEXTROSE 50 % IV SOLN
INTRAVENOUS | Status: AC
  Administered 2019-06-27: 25 mL via INTRAVENOUS
  Filled 2019-06-27: qty 50

## 2019-06-27 MED ORDER — KETOROLAC TROMETHAMINE 30 MG/ML IJ SOLN
30.0000 mg | Freq: Once | INTRAMUSCULAR | Status: AC
  Administered 2019-06-27: 30 mg via INTRAVENOUS
  Filled 2019-06-27: qty 1

## 2019-06-27 MED ORDER — ONDANSETRON HCL 4 MG/2ML IJ SOLN
4.0000 mg | INTRAMUSCULAR | Status: AC
  Administered 2019-06-27 – 2019-06-28 (×4): 4 mg via INTRAVENOUS
  Filled 2019-06-27 (×4): qty 2

## 2019-06-27 MED ORDER — METHOCARBAMOL 1000 MG/10ML IJ SOLN
500.0000 mg | Freq: Three times a day (TID) | INTRAVENOUS | Status: DC | PRN
  Administered 2019-06-27: 500 mg via INTRAVENOUS
  Filled 2019-06-27 (×4): qty 5

## 2019-06-27 MED ORDER — ENOXAPARIN SODIUM 40 MG/0.4ML ~~LOC~~ SOLN
40.0000 mg | SUBCUTANEOUS | Status: DC
  Administered 2019-06-27: 40 mg via SUBCUTANEOUS
  Filled 2019-06-27 (×2): qty 0.4

## 2019-06-27 MED ORDER — ONDANSETRON HCL 4 MG PO TABS: 4.0000 mg | ORAL_TABLET | Freq: Four times a day (QID) | ORAL | Status: DC | PRN

## 2019-06-27 MED ORDER — SODIUM CHLORIDE 0.9 % IV BOLUS
1000.0000 mL | Freq: Once | INTRAVENOUS | Status: AC
  Administered 2019-06-27: 1000 mL via INTRAVENOUS

## 2019-06-27 MED ORDER — ALBUTEROL SULFATE (2.5 MG/3ML) 0.083% IN NEBU: 2.5000 mg | INHALATION_SOLUTION | Freq: Four times a day (QID) | RESPIRATORY_TRACT | Status: DC | PRN

## 2019-06-27 MED ORDER — HYDROCODONE-ACETAMINOPHEN 5-325 MG PO TABS
1.0000 | ORAL_TABLET | Freq: Four times a day (QID) | ORAL | Status: DC | PRN
  Filled 2019-06-27: qty 1

## 2019-06-27 MED ORDER — HYDROCODONE-ACETAMINOPHEN 5-325 MG PO TABS
1.0000 | ORAL_TABLET | Freq: Once | ORAL | Status: AC
  Administered 2019-06-27: 1 via ORAL
  Filled 2019-06-27: qty 1

## 2019-06-27 MED ORDER — DEXTROSE 50 % IV SOLN
50.0000 mL | Freq: Once | INTRAVENOUS | Status: AC
  Administered 2019-06-27: 50 mL via INTRAVENOUS
  Filled 2019-06-27: qty 50

## 2019-06-27 NOTE — Progress Notes (Addendum)
Patient placed in OBS after midnight. Care began in ED before midnight.   43 yo hx depression/anxiety, seizure disorder, DM, presented to ED with generalized weakness, hypoglycemia and hematuria. Reports recently taken off of insulin and BS running low in setting of decreased oral intake nausea. She is refusing to eat and requiring D5 IV fluids to maintain serum glucose  A/P  1. Hypoglycemia  Presents with gen weakness and fatigue, found to have CBG 53 with recurrent drops back to 50's after treatment with dextrose in ED.  Etiology is unclear, she reports taking metformin at home and states that she had been on insulin until ~4 months ago but highest A1c in the past 5 yrs was 5.9% in 2016 and she has many low readings in EMR going back years.  cortisol level 1.6, beta hydroxybutyric acid 0.13. -follow sulfonylurea panel, insulin level, c-peptide, proinsulin - Continue infusion of 5% dextrose in 1/2 NS -CBG evry 2 hours -scheduled zofran -encourage food    2. Seizure disorder  Patient reports last seizure was ~1 month ago but reports having multiple seizures most months despite adherence with [place] - patient encouraged to establish with neurologist   3. Depression, anxiety  stable at baseline    4. Hematuria  Patient reports at least 2 months of left flank pain and intermittent gross hematuria.  She was treated for pyelonephritis 2 months ago, no evidence for infection now . CT renal stone study in August 2020 negative for stones or other urinary tract abnormality. Urinalysis with rare Hg - Outpatient urology follow-up recommended    Dyanne Carrel, NP    ? Cause of hypoglycemia-- only on metformin currently.  Previously on insulin-- ? If still taking.  AM cortisol low-- denies recent prednisone- stim test in AM.  BP also currently on the lower end of normal but was high when patient presented [facility]

## 2019-06-27 NOTE — Progress Notes (Signed)
Patient arrives to room 5w12 at this time from ED via wheelchair.  Settled into bed with siderails up x2 and callbell in reach

## 2019-06-27 NOTE — ED Notes (Signed)
Jennifer Crawford(SR-Lunch Tray Ordered)@ 1039.  

## 2019-06-27 NOTE — H&P (Signed)
History and Physical    Jennifer Crawford UDJ:[id number] DOB: 23-Jun-1976 DOA: 06/26/2019  PCP: Azzie Glatter, FNP   Patient coming from: Home   Chief Complaint: Generalized weakness, hematuria   HPI: Jennifer Crawford is a 43 y.o. female with medical history significant for depression with anxiety, seizure disorder, and reported history of type 2 diabetes mellitus, presented to the emergency department for evaluation of generalized weakness and hematuria.  Patient reports a longstanding history of type 2 diabetes mellitus and reports that she has been treated with insulin until approximately 4 months ago, now taking metformin since then.  She checks her blood sugar at home and reports that it is usually in the 60s to 80s, but sometimes "high" by which she means the 100s.  She had some generalized weakness yesterday, noted her blood sugars to be in the 60s, had ongoing generalized weakness today, reports that CBG was in the 50s at home, she developed some anxiety, and came in for evaluation.  She denies any fevers, chest pain, cough, or shortness of breath.  She denies any abdominal pain, vomiting, or diarrhea.  She reports some nausea and is refusing to eat or drink in the emergency department due to this.  She denies any  Patient states she has multiple seizures each month despite continued adherence with Keppra.  She reports recently moving from [place] and has not yet established with a neurologist here.    She has had left flank pain for a couple months, has been treated for possible pyelonephritis 2 months ago, continues to report left flank pain and gross hematuria, had CT stone study recently with no stones or appreciable abnormality involving the kidneys, ureters, or bladder.   Looking back at her blood work from the past 5 years, there does not appear to be any significantly elevated glucose readings, but she is frequently in the 50s and 60s.  Additionally, the highest A1c in our EMR in the  past 5 years was 5.9% in 2016.   ED Course: Upon arrival to the ED, patient is found to be afebrile, saturating well on room air, slightly bradycardic, and with stable blood pressure.  CBG was in the 50s in triage.  Chemistry panel was unremarkable, CBC also unremarkable.  Urinalysis notable for small hemoglobin, 30 protein, and elevated specific gravity.  Patient was given a liter of normal saline, half ampule of 50% dextrose, became hypoglycemic again, was given a full ampule of 50% dextrose, continues to have ongoing hypoglycemia, and is now started on dextrose infusion.  Review of Systems:  All other systems reviewed and apart from HPI, are negative.  Past Medical History:  Diagnosis Date  . Anxiety and depression   . Diabetes mellitus without complication ([place])   . Right flank pain 05/2019  . Right upper quadrant pain 8/22020  . Seizures ([place]) 2015  . Vitamin D deficiency 05/2019    Past Surgical History:  Procedure Laterality Date  . TUBAL LIGATION       reports that she has never smoked. She has never used smokeless tobacco. She reports current alcohol use. She reports that she does not use drugs.  Allergies  Allergen Reactions  . Tramadol Itching    Vaginal itching, per patient's original chart  . Acetaminophen Rash    Family History  Problem Relation Age of Onset  . Seizures Daughter   . Asthma Son   . Hypertension Father   . Arrhythmia Father        Status post  pacemaker insertion     Prior to Admission medications   Medication Sig Start Date End Date Taking? Authorizing Provider  blood glucose meter kit and supplies Dispense based on patient and insurance preference. Use up to four times daily as directed. (FOR ICD-10 E10.9, E11.9). 05/13/19   Azzie Glatter, FNP  cyclobenzaprine (FLEXERIL) 10 MG tablet Take 1 tablet (10 mg total) by mouth 2 (two) times daily as needed. 05/21/19   Fawze, Mina A, PA-C  HYDROcodone-acetaminophen (NORCO) 7.5-325 MG tablet Take 1  tablet by mouth every 6 (six) hours as needed for moderate pain. 05/30/19   Azzie Glatter, FNP  ibuprofen (ADVIL) 800 MG tablet Take 1 tablet (800 mg total) by mouth every 8 (eight) hours as needed. 06/16/19   Azzie Glatter, FNP  levETIRAcetam (KEPPRA) 1000 MG tablet Take 1 tablet (1,000 mg total) by mouth 2 (two) times daily. 05/13/19   Azzie Glatter, FNP  lidocaine (LIDODERM) 5 % Place 1 patch onto the skin daily. Remove & Discard patch within 12 hours or as directed by MD Patient not taking: Reported on 06/16/2019 05/21/19   Rodell Perna A, PA-C  naproxen (NAPROSYN) 500 MG tablet Take 1 tablet (500 mg total) by mouth 2 (two) times daily. 05/21/19   Fawze, Mina A, PA-C  Vitamin D, Ergocalciferol, (DRISDOL) 1.25 MG (50000 UT) CAPS capsule Take 1 capsule (50,000 Units total) by mouth every 7 (seven) days. 05/15/19   Azzie Glatter, FNP    Physical Exam: Vitals:   06/27/19 0300 06/27/19 0345 06/27/19 0430 06/27/19 0515  BP: (!) 130/94 113/81 112/84 123/74  Pulse: (!) 54 (!) 57 (!) 57 (!) 59  Resp: 13 14 14 16   Temp:      SpO2: 100% 100% 99% 100%    Constitutional: NAD, calm  Eyes: PERTLA, lids and conjunctivae normal ENMT: Mucous membranes are moist. Posterior pharynx clear of any exudate or lesions.   Neck: normal, supple, no masses, no thyromegaly Respiratory: no wheezing, no crackles. Normal respiratory effort. No accessory muscle use.  Cardiovascular: S1 & S2 heard, regular rate and rhythm. No extremity edema.   Abdomen: No distension, no tenderness, soft. Bowel sounds normal.  Musculoskeletal: no clubbing / cyanosis. No joint deformity upper and lower extremities.    Skin: no significant rashes, lesions, ulcers. Warm, dry, well-perfused. Neurologic: no facial asymmetry. Sensation intact. Moving all extremities.  Psychiatric: Alert and oriented to person, place, and situation. Calm, cooperative.    Labs on Admission: I have personally reviewed following labs and imaging  studies  CBC: Recent Labs  Lab 06/27/19 0002  WBC 5.9  HGB 13.3  HCT 39.5  MCV 95.6  PLT 562   Basic Metabolic Panel: Recent Labs  Lab 06/27/19 0002  NA 136  K 3.7  CL 103  CO2 22  GLUCOSE 87  BUN 15  CREATININE 0.86  CALCIUM 9.6   GFR: Estimated Creatinine Clearance: 100.4 mL/min (by C-G formula based on SCr of 0.86 mg/dL). Liver Function Tests: Recent Labs  Lab 06/27/19 0202  AST 24  ALT 19  ALKPHOS 54  BILITOT 0.7  PROT 7.6  ALBUMIN 4.1   No results for input(s): LIPASE, AMYLASE in the last 168 hours. No results for input(s): AMMONIA in the last 168 hours. Coagulation Profile: No results for input(s): INR, PROTIME in the last 168 hours. Cardiac Enzymes: No results for input(s): CKTOTAL, CKMB, CKMBINDEX, TROPONINI in the last 168 hours. BNP (last 3 results) No results for input(s): PROBNP in  the last 8760 hours. HbA1C: No results for input(s): HGBA1C in the last 72 hours. CBG: Recent Labs  Lab 06/27/19 0023 06/27/19 0118 06/27/19 0140 06/27/19 0229 06/27/19 0350  GLUCAP 55* 59* 63* 78 65*   Lipid Profile: No results for input(s): CHOL, HDL, LDLCALC, TRIG, CHOLHDL, LDLDIRECT in the last 72 hours. Thyroid Function Tests: No results for input(s): TSH, T4TOTAL, FREET4, T3FREE, THYROIDAB in the last 72 hours. Anemia Panel: No results for input(s): VITAMINB12, FOLATE, FERRITIN, TIBC, IRON, RETICCTPCT in the last 72 hours. Urine analysis:    Component Value Date/Time   COLORURINE AMBER (A) 06/27/2019 0022   APPEARANCEUR HAZY (A) 06/27/2019 0022   LABSPEC 1.035 (H) 06/27/2019 0022   PHURINE 5.0 06/27/2019 0022   GLUCOSEU NEGATIVE 06/27/2019 0022   HGBUR SMALL (A) 06/27/2019 0022   BILIRUBINUR SMALL (A) 06/27/2019 0022   BILIRUBINUR neg 06/16/2019 1402   KETONESUR NEGATIVE 06/27/2019 0022   PROTEINUR 30 (A) 06/27/2019 0022   UROBILINOGEN 1.0 06/16/2019 1402   UROBILINOGEN 1.0 05/12/2019 1505   NITRITE NEGATIVE 06/27/2019 0022   LEUKOCYTESUR  NEGATIVE 06/27/2019 0022   Sepsis Labs: @LABRCNTIP (procalcitonin:4,lacticidven:4) )No results found for this or any previous visit (from the past 240 hour(s)).   Radiological Exams on Admission: No results found.  EKG: Not performed.   Assessment/Plan   1. Hypoglycemia  - Presents with gen weakness and fatigue, found to have CBG 53 with recurrent drops back to 50's after treatment with dextrose in ED  - Etiology is unclear, she reports taking metformin at home and states that she had been on insulin until ~4 months ago but highest A1c in the past 5 yrs was 5.9% in 2016 and she has many low readings in EMR going back years  - She is not vomiting, denies abdominal pain, and had denied nausea initially but now refusing to try drinking or eating anything d/t nausea  - Check sulfonylurea panel, insulin level, c-peptide, proinsulin, BHOB, and cortisol levels - Continue infusion of 5% dextrose in 1/2 NS with frequent CBG's; start Zofran and continue to encourage oral intake    2. Seizure disorder  - Patient reports last seizure was ~1 month ago but reports having multiple seizures most months despite adherence with Keppra  - Continue Keppra; patient encouraged to establish with neurologist   3. Depression, anxiety  - Plan to continue home medications pending pharmacy med-rec   4. Hematuria  - Patient reports at least 2 months of left flank pain and intermittent gross hematuria  - She was treated for pyelonephritis 2 months ago, no evidence for infection now  - CT renal stone study in August 2020 negative for stones or other urinary tract abnormality  - Outpatient urology follow-up recommended    PPE: Mask, face shield  DVT prophylaxis: Lovenox  Code Status: Full  Family Communication: Discussed with patient  Consults called: None  Admission status: observation     Vianne Bulls, MD [facility] Pager [phone]  If 7PM-7AM, please contact night-coverage [email or web address]  Password [facility]  06/27/2019, 5:36 AM

## 2019-06-27 NOTE — ED Notes (Signed)
ED TO INPATIENT HANDOFF REPORT  ED Nurse Name and Phone #: Magnus Ivan RN [phone]  S Name/Age/Gender Jennifer Crawford 43 y.o. female Room/Bed: 014C/014C  Code Status   Code Status: Full Code  Home/SNF/Other Home Patient oriented to: self, place, time and situation Is this baseline? Yes   Triage Complete: Triage complete  Chief Complaint Back pain  Triage Note Pt reports generalized weakness since yesterday, also endorses urinary frequency with some blood noted. Denies n/v/d, does endorse chills.    Allergies Allergies  Allergen Reactions  . Tramadol Itching    Vaginal itching, per patient's original chart  . Acetaminophen Rash    Level of Care/Admitting Diagnosis ED Disposition    ED Disposition Condition Comment   Admit  Hospital Area: [facility] [[id number]]  Level of Care: Telemetry Medical [104]  I expect the patient will be discharged within 24 hours: Yes  LOW acuity---Tx typically complete <24 hrs---ACUTE conditions typically can be evaluated <24 hours---LABS likely to return to acceptable levels <24 hours---IS near functional baseline---EXPECTED to return to current living arrangement---NOT newly hypoxic: Does not meet criteria for 5C-Observation unit  Covid Evaluation: Asymptomatic Screening Protocol (No Symptoms)  Diagnosis: Hypoglycemia [454098]  Admitting Physician: Briscoe Deutscher [[id number]]  Attending Physician: Briscoe Deutscher [[id number]]  PT Class (Do Not Modify): Observation [104]  PT Acc Code (Do Not Modify): Observation [10022]       B Medical/Surgery History Past Medical History:  Diagnosis Date  . Anxiety and depression   . Diabetes mellitus without complication (HCC)   . Right flank pain 05/2019  . Right upper quadrant pain 8/22020  . Seizures (HCC) 2015  . Vitamin D deficiency 05/2019   Past Surgical History:  Procedure Laterality Date  . TUBAL LIGATION       A IV Location/Drains/Wounds Patient Lines/Drains/Airways  Status   Active Line/Drains/Airways    Name:   Placement date:   Placement time:   Site:   Days:   Peripheral IV 06/27/19 Right Antecubital   06/27/19    0150    Antecubital   less than 1          Intake/Output Last 24 hours  Intake/Output Summary (Last 24 hours) at 06/27/2019 1518 Last data filed at 06/27/2019 1338 Gross per 24 hour  Intake 338.84 ml  Output -  Net 338.84 ml    Labs/Imaging Results for orders placed or performed during the hospital encounter of 06/26/19 (from the past 48 hour(s))  CBG monitoring, ED     Status: Abnormal   Collection Time: 06/26/19 11:53 PM  Result Value Ref Range   Glucose-Capillary 53 (L) 70 - 99 mg/dL  Basic metabolic panel     Status: None   Collection Time: 06/27/19 12:02 AM  Result Value Ref Range   Sodium 136 135 - 145 mmol/L   Potassium 3.7 3.5 - 5.1 mmol/L   Chloride 103 98 - 111 mmol/L   CO2 22 22 - 32 mmol/L   Glucose, Bld 87 70 - 99 mg/dL   BUN 15 6 - 20 mg/dL   Creatinine, Ser 0.86 0.44 - 1.00 mg/dL   Calcium 9.6 8.9 - 57.8 mg/dL   GFR calc non Af Amer >60 >60 mL/min   GFR calc Af Amer >60 >60 mL/min   Anion gap 11 5 - 15    Comment: Performed at [facility], [address], [place], [place] [address]  CBC     Status: None   Collection Time:  06/27/19 12:02 AM  Result Value Ref Range   WBC 5.9 4.0 - 10.5 K/uL   RBC 4.13 3.87 - 5.11 MIL/uL   Hemoglobin 13.3 12.0 - 15.0 g/dL   HCT 40.9 81.1 - 91.4 %   MCV 95.6 80.0 - 100.0 fL   MCH 32.2 26.0 - 34.0 pg   MCHC 33.7 30.0 - 36.0 g/dL   RDW 78.2 95.6 - 21.3 %   Platelets 209 150 - 400 K/uL   nRBC 0.0 0.0 - 0.2 %    Comment: Performed at [facility], [address] [place], [place] [address]  I-Stat beta hCG blood, ED     Status: None   Collection Time: 06/27/19 12:07 AM  Result Value Ref Range   I-stat hCG, quantitative <5.0 <5 mIU/mL   Comment 3            Comment:   GEST. AGE      CONC.  (mIU/mL)   <=1 WEEK        5 - 50     2 WEEKS       50 - 500      3 WEEKS       100 - 10,000     4 WEEKS     1,000 - 30,000        FEMALE AND NON-PREGNANT FEMALE:     LESS THAN 5 mIU/mL   Urinalysis, Routine w reflex microscopic- may I&O cath if menses     Status: Abnormal   Collection Time: 06/27/19 12:22 AM  Result Value Ref Range   Color, Urine AMBER (A) YELLOW    Comment: BIOCHEMICALS MAY BE AFFECTED BY COLOR   APPearance HAZY (A) CLEAR   Specific Gravity, Urine 1.035 (H) 1.005 - 1.030   pH 5.0 5.0 - 8.0   Glucose, UA NEGATIVE NEGATIVE mg/dL   Hgb urine dipstick SMALL (A) NEGATIVE   Bilirubin Urine SMALL (A) NEGATIVE   Ketones, ur NEGATIVE NEGATIVE mg/dL   Protein, ur 30 (A) NEGATIVE mg/dL   Nitrite NEGATIVE NEGATIVE   Leukocytes,Ua NEGATIVE NEGATIVE   RBC / HPF 21-50 0 - 5 RBC/hpf   WBC, UA 0-5 0 - 5 WBC/hpf   Bacteria, UA NONE SEEN NONE SEEN   Squamous Epithelial / LPF 11-20 0 - 5   Mucus PRESENT     Comment: Performed at [facility], [address], [place], [place] [address]  CBG monitoring, ED     Status: Abnormal   Collection Time: 06/27/19 12:23 AM  Result Value Ref Range   Glucose-Capillary 55 (L) 70 - 99 mg/dL  CBG monitoring, ED     Status: Abnormal   Collection Time: 06/27/19  1:18 AM  Result Value Ref Range   Glucose-Capillary 59 (L) 70 - 99 mg/dL  CBG monitoring, ED     Status: Abnormal   Collection Time: 06/27/19  1:40 AM  Result Value Ref Range   Glucose-Capillary 63 (L) 70 - 99 mg/dL  Hepatic function panel     Status: None   Collection Time: 06/27/19  2:02 AM  Result Value Ref Range   Total Protein 7.6 6.5 - 8.1 g/dL   Albumin 4.1 3.5 - 5.0 g/dL   AST 24 15 - 41 U/L   ALT 19 0 - 44 U/L   Alkaline Phosphatase 54 38 - 126 U/L   Total Bilirubin 0.7 0.3 - 1.2 mg/dL   Bilirubin, Direct <2.9 0.0 - 0.2 mg/dL   Indirect Bilirubin  NOT CALCULATED 0.3 - 0.9 mg/dL    Comment: Performed at [facility], [address] [place], [place] [address]  CBG monitoring, ED     Status: None   Collection Time: 06/27/19   2:29 AM  Result Value Ref Range   Glucose-Capillary 78 70 - 99 mg/dL  CBG monitoring, ED     Status: Abnormal   Collection Time: 06/27/19  3:50 AM  Result Value Ref Range   Glucose-Capillary 65 (L) 70 - 99 mg/dL  SARS CORONAVIRUS 2 (TAT 6-24 HRS) Nasopharyngeal Nasopharyngeal Swab     Status: None   Collection Time: 06/27/19  5:26 AM   Specimen: Nasopharyngeal Swab  Result Value Ref Range   SARS Coronavirus 2 NEGATIVE NEGATIVE    Comment: (NOTE) SARS-CoV-2 target nucleic acids are NOT DETECTED. The SARS-CoV-2 RNA is generally detectable in upper and lower respiratory specimens during the acute phase of infection. Negative results do not preclude SARS-CoV-2 infection, do not rule out co-infections with other pathogens, and should not be used as the sole basis for treatment or other patient management decisions. Negative results must be combined with clinical observations, patient history, and epidemiological information. The expected result is Negative. Fact Sheet for Patients: HairSlick.nohttps://www.fda.gov/media/138098/download Fact Sheet for Healthcare Providers: quierodirigir.comhttps://www.fda.gov/media/138095/download This test is not yet approved or cleared by the [place] FDA and  has been authorized for detection and/or diagnosis of SARS-CoV-2 by FDA under an Emergency Use Authorization (EUA). This EUA will remain  in effect (meaning this test can be used) for the duration of the COVID-19 declaration under Section 56 4(b)(1) of the Act, 21 U.S.C. section 360bbb-3(b)(1), unless the authorization is terminated or revoked sooner. Performed at [facility], [address] [place], [place] [address]   Beta-hydroxybutyric acid     Status: None   Collection Time: 06/27/19  5:35 AM  Result Value Ref Range   Beta-Hydroxybutyric Acid 0.13 0.05 - 0.27 mmol/L    Comment: Performed at [facility], [address] [place], [place] [address]  Cortisol     Status: None   Collection Time:  06/27/19  5:35 AM  Result Value Ref Range   Cortisol, Plasma 1.6 ug/dL    Comment: (NOTE) AM    6.7 - 22.6 ug/dL PM   <21.3<10.0       ug/dL Performed at [facility], [address] [place], [place] [address]   Hemoglobin A1c     Status: None   Collection Time: 06/27/19  5:35 AM  Result Value Ref Range   Hgb A1c MFr Bld 5.6 4.8 - 5.6 %    Comment: (NOTE) Pre diabetes:          5.7%-6.4% Diabetes:              >6.4% Glycemic control for   <7.0% adults with diabetes    Mean Plasma Glucose 114.02 mg/dL    Comment: Performed at [facility], [address] [place], [place] [address]  CBG monitoring, ED     Status: Abnormal   Collection Time: 06/27/19  6:35 AM  Result Value Ref Range   Glucose-Capillary 132 (H) 70 - 99 mg/dL  CBG monitoring, ED     Status: Abnormal   Collection Time: 06/27/19  7:25 AM  Result Value Ref Range   Glucose-Capillary 113 (H) 70 - 99 mg/dL  CBG monitoring, ED     Status: None   Collection Time: 06/27/19 10:17 AM  Result Value  Ref Range   Glucose-Capillary 81 70 - 99 mg/dL  CBG monitoring, ED     Status: None   Collection Time: 06/27/19 12:19 PM  Result Value Ref Range   Glucose-Capillary 83 70 - 99 mg/dL  Urine rapid drug screen (hosp performed)     Status: Abnormal   Collection Time: 06/27/19  1:20 PM  Result Value Ref Range   Opiates POSITIVE (A) NONE DETECTED   Cocaine NONE DETECTED NONE DETECTED   Benzodiazepines NONE DETECTED NONE DETECTED   Amphetamines NONE DETECTED NONE DETECTED   Tetrahydrocannabinol NONE DETECTED NONE DETECTED   Barbiturates NONE DETECTED NONE DETECTED    Comment: (NOTE) DRUG SCREEN FOR MEDICAL PURPOSES ONLY.  IF CONFIRMATION IS NEEDED FOR ANY PURPOSE, NOTIFY LAB WITHIN 5 DAYS. LOWEST DETECTABLE LIMITS FOR URINE DRUG SCREEN Drug Class                     Cutoff (ng/mL) Amphetamine and metabolites    1000 Barbiturate and metabolites    200 Benzodiazepine                 200 Tricyclics and metabolites      300 Opiates and metabolites        300 Cocaine and metabolites        300 THC                            50 Performed at [facility], [address], [place], [place] [address]    No results found.  Pending Labs Unresulted Labs (From admission, onward)    Start     Ordered   07/04/19 0500  Creatinine, serum  (enoxaparin (LOVENOX)    CrCl >/= 30 ml/min)  Weekly,   R    Comments: while on enoxaparin therapy    06/27/19 0729   06/28/19 0500  HIV Antibody  (Routine Testing)  Tomorrow morning,   R     06/27/19 0729   06/28/19 0500  Basic metabolic panel  Tomorrow morning,   R     06/27/19 0729   06/28/19 0500  CBC  Tomorrow morning,   R     06/27/19 0729   06/28/19 0500  ACTH stimulation, 3 time points  Tomorrow morning,   R     06/27/19 1134   06/27/19 0558  Ethanol  Add-on,   AD     06/27/19 0558   06/27/19 0521  Insulin, random  Once,   STAT     06/27/19 0522   06/27/19 0521  Proinsulin/insulin ratio  Once,   STAT     06/27/19 0522   06/27/19 0521  C-peptide  Once,   STAT     06/27/19 0522   06/27/19 0521  Sulfonylurea Hypoglycemics Panel, blood  Once,   STAT     06/27/19 0522   06/26/19 2355  Urine C&S  Once,   STAT     06/26/19 2355          Vitals/Pain Today's Vitals   06/27/19 0948 06/27/19 1000 06/27/19 1100 06/27/19 1200  BP: 111/70 106/75 102/68 99/64  Pulse: 65 63 62 (!) 56  Resp: Temp:      SpO2: 100% 100% 100% 100%  PainSc:        Isolation Precautions No active isolations  Medications Medications  levETIRAcetam (KEPPRA) tablet 1,000 mg (1,000 mg Oral Given 06/27/19 1029)  enoxaparin (  LOVENOX) injection 40 mg (has no administration in time range)  sodium chloride flush (NS) 0.9 % injection 3 mL (3 mLs Intravenous Not Given 06/27/19 1003)  ondansetron (ZOFRAN) tablet 4 mg (has no administration in time range)    Or  ondansetron (ZOFRAN) injection 4 mg (has no administration in time range)  methocarbamol (ROBAXIN) 500 mg in  dextrose 5 % 50 mL IVPB (0 mg Intravenous Stopped 06/27/19 1338)  ondansetron (ZOFRAN) injection 4 mg (4 mg Intravenous Given 06/27/19 1329)  sodium chloride 0.9 % bolus 1,000 mL (0 mLs Intravenous Stopped 06/27/19 0256)  dextrose 50 % solution 25 mL (25 mLs Intravenous Given 06/27/19 0153)  HYDROcodone-acetaminophen (NORCO/VICODIN) 5-325 MG per tablet 1 tablet (1 tablet Oral Given 06/27/19 0318)  dextrose 50 % solution 50 mL (50 mLs Intravenous Given 06/27/19 0512)  dextrose 5 %-0.9 % sodium chloride infusion ( Intravenous Stopped 06/27/19 0741)    Mobility walks with person assist Low fall risk   Focused Assessments Musculoskeletal    R Recommendations: See Admitting Provider Note  Report given to:   Additional Notes:  Hx chronic back pain,

## 2019-06-27 NOTE — ED Notes (Signed)
Pt CBG 83

## 2019-06-27 NOTE — ED Notes (Signed)
Attending at Bedside.  

## 2019-06-27 NOTE — ED Provider Notes (Signed)
[facility] EMERGENCY DEPARTMENT Provider Note   CSN: [id number] Arrival date & time: 06/26/19  2345    History   Chief Complaint Chief Complaint  Patient presents with  . Urinary Frequency  . Hypoglycemia  . Fatigue    HPI Jennifer Crawford is a 43 y.o. female.   The history is provided by the patient.  Urinary Frequency  Hypoglycemia She has history of diabetes, seizures and comes in because she is feeling generally weak and very thirsty for the last several days.  She denies fever or chills.  She denies rhinorrhea, sore throat, cough.  She denies loss of sense of smell or taste.  There has been some aching in the left lateral abdomen.  She denies nausea, vomiting, diarrhea.  Blood sugars at home have been low.  She takes metformin for her diabetes.  She denies any exposure to COVID-19.  Past Medical History:  Diagnosis Date  . Anxiety and depression   . Diabetes mellitus without complication ([place])   . Right flank pain 05/2019  . Right upper quadrant pain 8/22020  . Seizures ([place]) 2015  . Vitamin D deficiency 05/2019    Patient Active Problem List   Diagnosis Date Noted  . Low back pain with sciatica 05/31/2019  . Left flank pain 05/31/2019  . Intractable headache 05/13/2019  . Right upper quadrant pain 05/13/2019  . Right flank pain 05/13/2019  . Diabetes mellitus type 2 in nonobese ([place]) 06/20/2015  . Elevated blood alcohol level 06/20/2015  . Anxiety and depression 06/20/2015  . Seizure ([place]) 06/19/2015    Past Surgical History:  Procedure Laterality Date  . TUBAL LIGATION       OB History   No obstetric history on file.      Home Medications    Prior to Admission medications   Medication Sig Start Date End Date Taking? Authorizing Provider  blood glucose meter kit and supplies Dispense based on patient and insurance preference. Use up to four times daily as directed. (FOR ICD-10 E10.9, E11.9). 05/13/19   Azzie Glatter, FNP   cyclobenzaprine (FLEXERIL) 10 MG tablet Take 1 tablet (10 mg total) by mouth 2 (two) times daily as needed. 05/21/19   Fawze, Mina A, PA-C  HYDROcodone-acetaminophen (NORCO) 7.5-325 MG tablet Take 1 tablet by mouth every 6 (six) hours as needed for moderate pain. 05/30/19   Azzie Glatter, FNP  ibuprofen (ADVIL) 800 MG tablet Take 1 tablet (800 mg total) by mouth every 8 (eight) hours as needed. 06/16/19   Azzie Glatter, FNP  levETIRAcetam (KEPPRA) 1000 MG tablet Take 1 tablet (1,000 mg total) by mouth 2 (two) times daily. 05/13/19   Azzie Glatter, FNP  lidocaine (LIDODERM) 5 % Place 1 patch onto the skin daily. Remove & Discard patch within 12 hours or as directed by MD Patient not taking: Reported on 06/16/2019 05/21/19   Rodell Perna A, PA-C  naproxen (NAPROSYN) 500 MG tablet Take 1 tablet (500 mg total) by mouth 2 (two) times daily. 05/21/19   Fawze, Mina A, PA-C  Vitamin D, Ergocalciferol, (DRISDOL) 1.25 MG (50000 UT) CAPS capsule Take 1 capsule (50,000 Units total) by mouth every 7 (seven) days. 05/15/19   Azzie Glatter, FNP    Family History Family History  Problem Relation Age of Onset  . Seizures Daughter   . Asthma Son   . Hypertension Father   . Arrhythmia Father        Status post pacemaker insertion  Social History Social History   Tobacco Use  . Smoking status: Never Smoker  . Smokeless tobacco: Never Used  Substance Use Topics  . Alcohol use: Yes    Alcohol/week: 0.0 standard drinks    Comment: Occasional social  . Drug use: No     Allergies   Tramadol and Acetaminophen   Review of Systems Review of Systems  Genitourinary: Positive for frequency.  All other systems reviewed and are negative.    Physical Exam Updated Vital Signs BP (!) 147/83   Pulse 68   Temp 98.1 F (36.7 C)   Resp 17   SpO2 100%   Physical Exam Vitals signs and nursing note reviewed.    43 year old female, resting comfortably and in no acute distress. Vital signs are  significant for elevated blood pressure. Oxygen saturation is 100%, which is normal. Head is normocephalic and atraumatic. PERRLA, EOMI. Oropharynx is clear. Neck is nontender and supple without adenopathy or JVD. Back is nontender and there is no CVA tenderness. Lungs are clear without rales, wheezes, or rhonchi. Chest is nontender. Heart has regular rate and rhythm without murmur. Abdomen is soft, flat, with mild left mid abdominal tenderness.  There is no rebound or guarding.  There are no masses or hepatosplenomegaly and peristalsis is normoactive. Extremities have no cyanosis or edema, full range of motion is present. Skin is warm and dry without rash. Neurologic: Mental status is normal, cranial nerves are intact, there are no motor or sensory deficits.  ED Treatments / Results  Labs (all labs ordered are listed, but only abnormal results are displayed) Labs Reviewed  URINALYSIS, ROUTINE W REFLEX MICROSCOPIC - Abnormal; Notable for the following components:      Result Value   Color, Urine AMBER (*)    APPearance HAZY (*)    Specific Gravity, Urine 1.035 (*)    Hgb urine dipstick SMALL (*)    Bilirubin Urine SMALL (*)    Protein, ur 30 (*)    All other components within normal limits  CBG MONITORING, ED - Abnormal; Notable for the following components:   Glucose-Capillary 53 (*)    All other components within normal limits  CBG MONITORING, ED - Abnormal; Notable for the following components:   Glucose-Capillary 55 (*)    All other components within normal limits  CBG MONITORING, ED - Abnormal; Notable for the following components:   Glucose-Capillary 59 (*)    All other components within normal limits  CBG MONITORING, ED - Abnormal; Notable for the following components:   Glucose-Capillary 63 (*)    All other components within normal limits  CBG MONITORING, ED - Abnormal; Notable for the following components:   Glucose-Capillary 65 (*)    All other components within  normal limits  URINE CULTURE  SARS CORONAVIRUS 2 (TAT 6-24 HRS)  BASIC METABOLIC PANEL  CBC  HEPATIC FUNCTION PANEL  INSULIN, RANDOM  PROINSULIN/INSULIN RATIO  C-PEPTIDE  BETA-HYDROXYBUTYRIC ACID  SULFONYLUREA HYPOGLYCEMICS PANEL, SERUM  CORTISOL  HEMOGLOBIN A1C  I-STAT BETA HCG BLOOD, ED ([facility], [facility], [facility] ONLY)  CBG MONITORING, ED   Procedures Procedures  CRITICAL CARE Performed by: Delora Fuel Total critical care time: 45 minutes Critical care time was exclusive of separately billable procedures and treating other patients. Critical care was necessary to treat or prevent imminent or life-threatening deterioration. Critical care was time spent personally by me on the following activities: development of treatment plan with patient and/or surrogate as well as nursing, discussions with consultants, evaluation  of patient's response to treatment, examination of patient, obtaining history from patient or surrogate, ordering and performing treatments and interventions, ordering and review of laboratory studies, ordering and review of radiographic studies, pulse oximetry and re-evaluation of patient's condition.  Medications Ordered in ED Medications  sodium chloride 0.9 % bolus 1,000 mL (0 mLs Intravenous Stopped 06/27/19 0256)  dextrose 50 % solution 25 mL (25 mLs Intravenous Given 06/27/19 0153)  HYDROcodone-acetaminophen (NORCO/VICODIN) 5-325 MG per tablet 1 tablet (1 tablet Oral Given 06/27/19 0318)  dextrose 50 % solution 50 mL (50 mLs Intravenous Given 06/27/19 0512)  dextrose 5 %-0.9 % sodium chloride infusion ( Intravenous New Bag/Given 06/27/19 0517)     Initial Impression / Assessment and Plan / ED Course  I have reviewed the triage vital signs and the nursing notes.  Pertinent labs & imaging results that were available during my care of the patient were reviewed by me and considered in my medical decision making (see chart for details).  Malaise of uncertain cause, possible viral  syndrome.  Labs do show borderline hypoglycemia, urine with elevated specific gravity and a contaminated specimen but no evidence of UTI.  CBC and basic metabolic panel are normal.  Will check hepatic functions and give IV fluids.  She is refusing to eat, so she is given intravenous glucose.  Hepatic function panel was normal.  Glucose initially went up, but then started coming back down.  Since she is not taking food orally, she is not felt to be safe for discharge.  She is given additional glucose and started on a glucose infusion.  Case is discussed with Dr. Kelli Hope of [facility] hospitalist, who agrees to admit the patient.  Cause for her hypoglycemia is not clear.  Classically, metformin does not cause hypoglycemia.  However, I do not see any documentation of elevated glucose and it would probably be prudent to discontinue metformin.  Jennifer Crawford was evaluated in Emergency Department on 06/27/2019 for the symptoms described in the history of present illness. She was evaluated in the context of the global COVID-19 pandemic, which necessitated consideration that the patient might be at risk for infection with the SARS-CoV-2 virus that causes COVID-19. Institutional protocols and algorithms that pertain to the evaluation of patients at risk for COVID-19 are in a state of rapid change based on information released by regulatory bodies including the CDC and federal and state organizations. These policies and algorithms were followed during the patient's care in the ED.  Final Clinical Impressions(s) / ED Diagnoses   Final diagnoses:  Hypoglycemia    ED Discharge Orders    None       Delora Fuel, MD 51/10/21 [phone]

## 2019-06-28 DIAGNOSIS — E162 Hypoglycemia, unspecified: Secondary | ICD-10-CM | POA: Diagnosis not present

## 2019-06-28 LAB — CBC
HCT: 34.8 % — ABNORMAL LOW (ref 36.0–46.0)
Hemoglobin: 11.7 g/dL — ABNORMAL LOW (ref 12.0–15.0)
MCH: 32.1 pg (ref 26.0–34.0)
MCHC: 33.6 g/dL (ref 30.0–36.0)
MCV: 95.3 fL (ref 80.0–100.0)
Platelets: 176 10*3/uL (ref 150–400)
RBC: 3.65 MIL/uL — ABNORMAL LOW (ref 3.87–5.11)
RDW: 13.1 % (ref 11.5–15.5)
WBC: 5.1 10*3/uL (ref 4.0–10.5)
nRBC: 0 % (ref 0.0–0.2)

## 2019-06-28 LAB — BASIC METABOLIC PANEL
Anion gap: 6 (ref 5–15)
BUN: 11 mg/dL (ref 6–20)
CO2: 22 mmol/L (ref 22–32)
Calcium: 8.8 mg/dL — ABNORMAL LOW (ref 8.9–10.3)
Chloride: 110 mmol/L (ref 98–111)
Creatinine, Ser: 1.05 mg/dL — ABNORMAL HIGH (ref 0.44–1.00)
GFR calc Af Amer: >60 mL/min (ref >60)
GFR calc non Af Amer: >60 mL/min (ref >60)
Glucose, Bld: 110 mg/dL — ABNORMAL HIGH (ref 70–99)
Potassium: 4 mmol/L (ref 3.5–5.1)
Sodium: 138 mmol/L (ref 135–145)

## 2019-06-28 LAB — HIV ANTIBODY (ROUTINE TESTING W REFLEX): HIV Screen 4th Generation wRfx: NONREACTIVE

## 2019-06-28 LAB — ACTH STIMULATION, 3 TIME POINTS
Cortisol, 30 Min: 7.8 ug/dL
Cortisol, 60 Min: 7.2 ug/dL
Cortisol, Base: 6.5 ug/dL

## 2019-06-28 LAB — C-PEPTIDE: C-Peptide: 4.3 ng/mL (ref 1.1–4.4)

## 2019-06-28 LAB — GLUCOSE, CAPILLARY
Glucose-Capillary: 114 mg/dL — ABNORMAL HIGH (ref 70–99)
Glucose-Capillary: 89 mg/dL (ref 70–99)
Glucose-Capillary: 92 mg/dL (ref 70–99)
Glucose-Capillary: 92 mg/dL (ref 70–99)
Glucose-Capillary: 99 mg/dL (ref 70–99)

## 2019-06-28 LAB — INSULIN, RANDOM: Insulin: 36.3 u[IU]/mL — ABNORMAL HIGH (ref 2.6–24.9)

## 2019-06-28 MED ORDER — POTASSIUM CHLORIDE CRYS ER 20 MEQ PO TBCR
40.0000 meq | EXTENDED_RELEASE_TABLET | Freq: Once | ORAL | Status: AC
  Administered 2019-06-28: 40 meq via ORAL
  Filled 2019-06-28: qty 2

## 2019-06-28 NOTE — Progress Notes (Signed)
Burna Cash to be D/C'd Home per MD order.  Discussed with the patient and all questions fully answered.  VSS, Skin clean, dry and intact without evidence of skin break down, no evidence of skin tears noted. IV catheter discontinued intact. Site without signs and symptoms of complications. Dressing and pressure applied.  An After Visit Summary was printed and given to the patient. Patient received prescription.  D/c education completed with patient/family including follow up instructions, medication list, d/c activities limitations if indicated, with other d/c instructions as indicated by MD - patient able to verbalize understanding, all questions fully answered.   Patient instructed to return to ED, call 911, or call MD for any changes in condition.   Patient escorted via [place], and D/C home via private auto.  Luci Bank 06/28/2019 2:21 PM

## 2019-06-28 NOTE — Discharge Summary (Signed)
Physician Discharge Summary  Jennifer Crawford BRA:[id number] DOB: Aug 08, 1976 DOA: 06/26/2019  PCP: Azzie Glatter, FNP  Admit date: 06/26/2019 Discharge date: 06/28/2019  Admitted From: home Discharge disposition: home   Recommendations for Outpatient Follow-Up:   1. Stop metformin Keep log of blood sugars- if still having hypoglycemia then referral to endocrine If patient has hematuria- outpatient referral to urology HIV and sulfonylurea panel pending at discharge  Discharge Diagnosis:   Principal Problem:   Hypoglycemia Active Problems:   Seizure disorder ([place])   Anxiety and depression   Hematuria    Discharge Condition: Improved.  Diet recommendation:  Carbohydrate-modified.  Wound care: None.  Code status: Full.   History of Present Illness:   Jennifer Crawford is a 43 y.o. female with medical history significant for depression with anxiety, seizure disorder, and reported history of type 2 diabetes mellitus, presented to the emergency department for evaluation of generalized weakness and hematuria.  Patient reports a longstanding history of type 2 diabetes mellitus and reports that she has been treated with insulin until approximately 4 months ago, now taking metformin since then.  She checks her blood sugar at home and reports that it is usually in the 60s to 80s, but sometimes "high" by which she means the 100s.  She had some generalized weakness yesterday, noted her blood sugars to be in the 60s, had ongoing generalized weakness today, reports that CBG was in the 50s at home, she developed some anxiety, and came in for evaluation.  She denies any fevers, chest pain, cough, or shortness of breath.  She denies any abdominal pain, vomiting, or diarrhea.  She reports some nausea and is refusing to eat or drink in the emergency department due to this.  She denies any  Patient states she has multiple seizures each month despite continued adherence with Keppra.  She  reports recently moving from [place] and has not yet established with a neurologist here.    She has had left flank pain for a couple months, has been treated for possible pyelonephritis 2 months ago, continues to report left flank pain and gross hematuria, had CT stone study recently with no stones or appreciable abnormality involving the kidneys, ureters, or bladder.   Looking back at her blood work from the past 5 years, there does not appear to be any significantly elevated glucose readings, but she is frequently in the 50s and 60s.  Additionally, the highest A1c in our EMR in the past 5 years was 5.9% in 2016.    Hospital Course by Problem:   1.HypoglycemiaPresents with gen weakness and fatigue, found to have CBG 53 with recurrent drops back to 50's after treatment with dextrose in ED. Etiology is unclear, she reports taking metformin at home and states that she had been on insulin until ~4 months ago but highest A1c in the past 5 yrs was 5.9% in 2016 and she has many low readings in EMR going back years.  -follow sulfonylurea panel -other markers normal -attempted a stim test but unfortunately the cosytropin was never given but her AM cortisol was normal  2.Seizure disorderPatient reports last seizure was ~1 month ago but reports having multiple seizures most months despite adherence with Keppra -Continue Keppra - patient encouraged to establish with neurologist  3.Depression, anxiety stable at baseline  4.HematuriaPatient reports at least 2 months of left flank pain and intermittent gross hematuria. She was treated for pyelonephritis 2 months ago, no evidence for infection now.CT renal stone study  in August 2020 negative for stones or other urinary tract abnormality. Urinalysis with rare Hg -Outpatient urology follow-up recommended -culture with <10,000   Medical Consultants:      Discharge Exam:   Vitals:   06/28/19 0900 06/28/19 1311  BP: 100/60  107/66  Pulse: 63 63  Resp:  16  Temp: 98.2 F (36.8 C) 98.2 F (36.8 C)  SpO2: 100% 100%   Vitals:   06/27/19 2125 06/28/19 0514 06/28/19 0900 06/28/19 1311  BP: 107/61 108/65 100/60 107/66  Pulse: 66 61 63 63  Resp:    16  Temp: 98.6 F (37 C) 98.5 F (36.9 C) 98.2 F (36.8 C) 98.2 F (36.8 C)  TempSrc: Oral Oral Oral   SpO2: 100% 98% 100% 100%  Weight:      Height:        General exam: Appears calm and comfortable.    The results of significant diagnostics from this hospitalization (including imaging, microbiology, ancillary and laboratory) are listed below for reference.     Procedures and Diagnostic Studies:   No results found.   Labs:   Basic Metabolic Panel: Recent Labs  Lab 06/27/19 0002 06/28/19 0659  NA 136 138  K 3.7 4.0  CL 103 110  CO2 22 22  GLUCOSE 87 110*  BUN 15 11  CREATININE 0.86 1.05*  CALCIUM 9.6 8.8*   GFR Estimated Creatinine Clearance: 82.2 mL/min (A) (by C-G formula based on SCr of 1.05 mg/dL (H)). Liver Function Tests: Recent Labs  Lab 06/27/19 0202  AST 24  ALT 19  ALKPHOS 54  BILITOT 0.7  PROT 7.6  ALBUMIN 4.1   No results for input(s): LIPASE, AMYLASE in the last 168 hours. No results for input(s): AMMONIA in the last 168 hours. Coagulation profile No results for input(s): INR, PROTIME in the last 168 hours.  CBC: Recent Labs  Lab 06/27/19 0002 06/28/19 0659  WBC 5.9 5.1  HGB 13.3 11.7*  HCT 39.5 34.8*  MCV 95.6 95.3  PLT 209 176   Cardiac Enzymes: No results for input(s): CKTOTAL, CKMB, CKMBINDEX, TROPONINI in the last 168 hours. BNP: Invalid input(s): POCBNP CBG: Recent Labs  Lab 06/28/19 0035 06/28/19 0235 06/28/19 0424 06/28/19 0826 06/28/19 1313  GLUCAP 92 99 89 92 114*   D-Dimer No results for input(s): DDIMER in the last 72 hours. Hgb A1c Recent Labs    06/27/19 0535  HGBA1C 5.6   Lipid Profile No results for input(s): CHOL, HDL, LDLCALC, TRIG, CHOLHDL, LDLDIRECT in the last 72  hours. Thyroid function studies No results for input(s): TSH, T4TOTAL, T3FREE, THYROIDAB in the last 72 hours.  Invalid input(s): FREET3 Anemia work up No results for input(s): VITAMINB12, FOLATE, FERRITIN, TIBC, IRON, RETICCTPCT in the last 72 hours. Microbiology Recent Results (from the past 240 hour(s))  Urine C&S     Status: Abnormal   Collection Time: 06/27/19 12:12 AM   Specimen: Urine, Clean Catch  Result Value Ref Range Status   Specimen Description URINE, CLEAN CATCH  Final   Special Requests NONE  Final   Culture (A)  Final    <10,000 COLONIES/mL INSIGNIFICANT GROWTH Performed at [facility], [address], [place], [place] [address]    Report Status 06/27/2019 FINAL  Final  SARS CORONAVIRUS 2 (TAT 6-24 HRS) Nasopharyngeal Nasopharyngeal Swab     Status: None   Collection Time: 06/27/19  5:26 AM   Specimen: Nasopharyngeal Swab  Result Value Ref Range Status   SARS Coronavirus 2 NEGATIVE  NEGATIVE Final    Comment: (NOTE) SARS-CoV-2 target nucleic acids are NOT DETECTED. The SARS-CoV-2 RNA is generally detectable in upper and lower respiratory specimens during the acute phase of infection. Negative results do not preclude SARS-CoV-2 infection, do not rule out co-infections with other pathogens, and should not be used as the sole basis for treatment or other patient management decisions. Negative results must be combined with clinical observations, patient history, and epidemiological information. The expected result is Negative. Fact Sheet for Patients: SugarRoll.be Fact Sheet for Healthcare Providers: https://www.woods-mathews.com/ This test is not yet approved or cleared by the [place] FDA and  has been authorized for detection and/or diagnosis of SARS-CoV-2 by FDA under an Emergency Use Authorization (EUA). This EUA will remain  in effect (meaning this test can be used) for the duration of the COVID-19  declaration under Section 56 4(b)(1) of the Act, 21 U.S.C. section 360bbb-3(b)(1), unless the authorization is terminated or revoked sooner. Performed at [facility], [place] [address], [place], [place] [address]      Discharge Instructions:   Discharge Instructions    Diet Carb Modified   Complete by: As directed    Discharge instructions   Complete by: As directed    Stop the metformin Monitor blood sugars once daily-- before you eat in the AM Eat more lean meats (chicken and fish) as well as non-starchy vegetables   Increase activity slowly   Complete by: As directed      Allergies as of 06/28/2019      Reactions   Tramadol Itching   Vaginal itching, per patient's original chart   Acetaminophen Rash      Medication List    STOP taking these medications   cyclobenzaprine 10 MG tablet Commonly known as: FLEXERIL   HYDROcodone-acetaminophen 7.5-325 MG tablet Commonly known as: Norco   lidocaine 5 % Commonly known as: Lidoderm   naproxen 500 MG tablet Commonly known as: NAPROSYN     TAKE these medications   albuterol (2.5 MG/3ML) 0.083% nebulizer solution Commonly known as: PROVENTIL Take 2.5 mg by nebulization every 6 (six) hours as needed for wheezing or shortness of breath.   albuterol 108 (90 Base) MCG/ACT inhaler Commonly known as: VENTOLIN HFA Inhale 2 puffs into the lungs every 6 (six) hours as needed for wheezing or shortness of breath.   blood glucose meter kit and supplies Dispense based on patient and insurance preference. Use up to four times daily as directed. (FOR ICD-10 E10.9, E11.9).   ibuprofen 800 MG tablet Commonly known as: ADVIL Take 1 tablet (800 mg total) by mouth every 8 (eight) hours as needed. What changed: reasons to take this   levETIRAcetam 1000 MG tablet Commonly known as: Keppra Take 1 tablet (1,000 mg total) by mouth 2 (two) times daily.   Vitamin D (Ergocalciferol) 1.25 MG (50000 UT) Caps capsule Commonly known  as: DRISDOL Take 1 capsule (50,000 Units total) by mouth every 7 (seven) days. What changed: when to take this      Follow-up Information    Azzie Glatter, FNP Follow up in 1 week(s).   Specialty: Family Medicine Why: patient to bring in her blood sugar log Contact information: [place] [address] [phone]            Time coordinating discharge: 25 min  Signed:  Geradine Girt DO  [facility] 06/28/2019, 3:08 PM

## 2019-07-04 LAB — PROINSULIN/INSULIN RATIO
Insulin: 27 u[IU]/mL — ABNORMAL HIGH
Proinsulin/Insulin Ratio: 15 %
Proinsulin: 27 pmol/L

## 2019-07-06 ENCOUNTER — Emergency Department (HOSPITAL_COMMUNITY)
Admission: EM | Admit: 2019-07-06 | Discharge: 2019-07-06 | Disposition: A | Discharge status: Home / Self Care | Payer: No Typology Code available for payment source | Attending: Emergency Medicine | Admitting: Emergency Medicine

## 2019-07-06 ENCOUNTER — Encounter (HOSPITAL_COMMUNITY): Payer: Self-pay | Admitting: Emergency Medicine

## 2019-07-06 ENCOUNTER — Other Ambulatory Visit: Payer: Self-pay

## 2019-07-06 DIAGNOSIS — F10120 Alcohol abuse with intoxication, uncomplicated: Secondary | ICD-10-CM | POA: Diagnosis not present

## 2019-07-06 DIAGNOSIS — E119 Type 2 diabetes mellitus without complications: Secondary | ICD-10-CM | POA: Insufficient documentation

## 2019-07-06 DIAGNOSIS — F445 Conversion disorder with seizures or convulsions: Secondary | ICD-10-CM | POA: Diagnosis not present

## 2019-07-06 DIAGNOSIS — Y906 Blood alcohol level of 120-199 mg/100 ml: Secondary | ICD-10-CM | POA: Diagnosis not present

## 2019-07-06 DIAGNOSIS — R251 Tremor, unspecified: Secondary | ICD-10-CM | POA: Diagnosis present

## 2019-07-06 DIAGNOSIS — Z79899 Other long term (current) drug therapy: Secondary | ICD-10-CM | POA: Insufficient documentation

## 2019-07-06 LAB — CBC WITH DIFFERENTIAL/PLATELET
Abs Immature Granulocytes: 0.01 10*3/uL (ref 0.00–0.07)
Basophils Absolute: 0 10*3/uL (ref 0.0–0.1)
Basophils Relative: 1 %
Eosinophils Absolute: 0.1 10*3/uL (ref 0.0–0.5)
Eosinophils Relative: 2 %
HCT: 41.6 % (ref 36.0–46.0)
Hemoglobin: 13.1 g/dL (ref 12.0–15.0)
Immature Granulocytes: 0 %
Lymphocytes Relative: 55 %
Lymphs Abs: 3 10*3/uL (ref 0.7–4.0)
MCH: 31.3 pg (ref 26.0–34.0)
MCHC: 31.5 g/dL (ref 30.0–36.0)
MCV: 99.3 fL (ref 80.0–100.0)
Monocytes Absolute: 0.3 10*3/uL (ref 0.1–1.0)
Monocytes Relative: 6 %
Neutro Abs: 1.9 10*3/uL (ref 1.7–7.7)
Neutrophils Relative %: 36 %
Platelets: 139 10*3/uL — ABNORMAL LOW (ref 150–400)
RBC: 4.19 MIL/uL (ref 3.87–5.11)
RDW: 13.2 % (ref 11.5–15.5)
WBC: 5.4 10*3/uL (ref 4.0–10.5)
nRBC: 0 % (ref 0.0–0.2)

## 2019-07-06 LAB — COMPREHENSIVE METABOLIC PANEL
ALT: 27 U/L (ref 0–44)
AST: 29 U/L (ref 15–41)
Albumin: 4.5 g/dL (ref 3.5–5.0)
Alkaline Phosphatase: 69 U/L (ref 38–126)
Anion gap: 11 (ref 5–15)
BUN: 10 mg/dL (ref 6–20)
CO2: 19 mmol/L — ABNORMAL LOW (ref 22–32)
Calcium: 8.8 mg/dL — ABNORMAL LOW (ref 8.9–10.3)
Chloride: 109 mmol/L (ref 98–111)
Creatinine, Ser: 0.87 mg/dL (ref 0.44–1.00)
GFR calc Af Amer: >60 mL/min (ref >60)
GFR calc non Af Amer: >60 mL/min (ref >60)
Glucose, Bld: 72 mg/dL (ref 70–99)
Potassium: 4.3 mmol/L (ref 3.5–5.1)
Sodium: 139 mmol/L (ref 135–145)
Total Bilirubin: 0.7 mg/dL (ref 0.3–1.2)
Total Protein: 8.2 g/dL — ABNORMAL HIGH (ref 6.5–8.1)

## 2019-07-06 LAB — ETHANOL: Alcohol, Ethyl (B): 167 mg/dL — ABNORMAL HIGH (ref <10)

## 2019-07-06 LAB — CBG MONITORING, ED: Glucose-Capillary: 81 mg/dL (ref 70–99)

## 2019-07-06 MED ORDER — SODIUM CHLORIDE 0.9 % IV BOLUS
1000.0000 mL | Freq: Once | INTRAVENOUS | Status: AC
  Administered 2019-07-06: 1000 mL via INTRAVENOUS

## 2019-07-06 MED ORDER — METOCLOPRAMIDE HCL 5 MG/ML IJ SOLN
10.0000 mg | Freq: Once | INTRAMUSCULAR | Status: AC
  Administered 2019-07-06: 14:00:00 10 mg via INTRAVENOUS
  Filled 2019-07-06: qty 2

## 2019-07-06 MED ORDER — LORAZEPAM 2 MG/ML IJ SOLN
INTRAMUSCULAR | Status: AC
  Filled 2019-07-06: qty 1

## 2019-07-06 MED ORDER — GLUCOSE 40 % PO GEL
ORAL | Status: AC
  Filled 2019-07-06: qty 1

## 2019-07-06 MED ORDER — MECLIZINE HCL 25 MG PO TABS: 25.0000 mg | ORAL_TABLET | Freq: Once | ORAL | Status: DC

## 2019-07-06 NOTE — ED Notes (Signed)
Pt alert and answering questions appropriately when asked by the EDP.

## 2019-07-06 NOTE — ED Notes (Signed)
Ambulatory to bathroom.

## 2019-07-06 NOTE — ED Notes (Signed)
Pt alert and oriented. Discussed pending discharge. Med admin and ambulation.

## 2019-07-06 NOTE — ED Notes (Signed)
Patient brought from bathroom lobby and placed on stretcher, started with body shaking. Patient will not drop hand on face or head. When advised we would need to do invasive procedures due to her seizure she opened eyes and making sounds. No tongue biting, no incontinence

## 2019-07-06 NOTE — ED Provider Notes (Signed)
[facility] EMERGENCY DEPARTMENT Provider Note   CSN: [id number] Arrival date & time: 07/06/19  0818     History   Chief Complaint Chief Complaint  Patient presents with  . Hypoglycemia    HPI Jennifer Crawford is a 43 y.o. female who was found shaking on the bathroom floor and brought emergently to the trauma bay for evaluation.  The patient states that she is "having a seizure."  After significant prompting to speak to me even though she remained shaking the patient patient states that she is a "sugar diabetic."  She states that she went out drinking with her friends last night.  She had "a lot" to drink.  She states that she became nauseated and started vomiting and she thinks her sugar might be low.  She states it is very quiet and difficulty her voice "I just do not feel good."  She feels nauseous.  She has a headache.  She has been vomiting.  She denies abdominal pain.     HPI  Past Medical History:  Diagnosis Date  . Anxiety and depression   . Diabetes mellitus without complication ([place])   . Right flank pain 05/2019  . Right upper quadrant pain 8/22020  . Seizures ([place]) 2015  . Vitamin D deficiency 05/2019    Patient Active Problem List   Diagnosis Date Noted  . Hypoglycemia 06/27/2019  . Hematuria 06/27/2019  . Low back pain with sciatica 05/31/2019  . Left flank pain 05/31/2019  . Intractable headache 05/13/2019  . Right upper quadrant pain 05/13/2019  . Right flank pain 05/13/2019  . Diabetes mellitus type 2 in nonobese ([place]) 06/20/2015  . Elevated blood alcohol level 06/20/2015  . Anxiety and depression 06/20/2015  . Seizure disorder ([place]) 06/19/2015    Past Surgical History:  Procedure Laterality Date  . TUBAL LIGATION       OB History   No obstetric history on file.      Home Medications    Prior to Admission medications   Medication Sig Start Date End Date Taking? Authorizing Provider  albuterol (PROVENTIL) (2.5 MG/3ML)  0.083% nebulizer solution Take 2.5 mg by nebulization every 6 (six) hours as needed for wheezing or shortness of breath.    [provider]  albuterol (VENTOLIN HFA) 108 (90 Base) MCG/ACT inhaler Inhale 2 puffs into the lungs every 6 (six) hours as needed for wheezing or shortness of breath.    [provider]  blood glucose meter kit and supplies Dispense based on patient and insurance preference. Use up to four times daily as directed. (FOR ICD-10 E10.9, E11.9). 05/13/19   Azzie Glatter, FNP  ibuprofen (ADVIL) 800 MG tablet Take 1 tablet (800 mg total) by mouth every 8 (eight) hours as needed. Patient taking differently: Take 800 mg by mouth every 8 (eight) hours as needed for mild pain.  06/16/19   Azzie Glatter, FNP  levETIRAcetam (KEPPRA) 1000 MG tablet Take 1 tablet (1,000 mg total) by mouth 2 (two) times daily. 05/13/19   Azzie Glatter, FNP  Vitamin D, Ergocalciferol, (DRISDOL) 1.25 MG (50000 UT) CAPS capsule Take 1 capsule (50,000 Units total) by mouth every 7 (seven) days. Patient taking differently: Take 50,000 Units by mouth every Monday.  05/15/19   Azzie Glatter, FNP    Family History Family History  Problem Relation Age of Onset  . Seizures Daughter   . Asthma Son   . Hypertension Father   . Arrhythmia Father  Status post pacemaker insertion    Social History Social History   Tobacco Use  . Smoking status: Never Smoker  . Smokeless tobacco: Never Used  Substance Use Topics  . Alcohol use: Yes    Alcohol/week: 0.0 standard drinks    Comment: Occasional social  . Drug use: No     Allergies   Tramadol and Acetaminophen   Review of Systems Review of Systems  Ten systems reviewed and are negative for acute change, except as noted in the HPI.   Physical Exam Updated Vital Signs BP (!) 149/105   Pulse 86   Temp (!) 96.9 F (36.1 C)   Resp 11   Ht 6' 1"  (1.854 m)   Wt 73 kg   SpO2 100%   BMI 21.24 kg/m   Physical Exam  Vitals signs and nursing note reviewed.  Constitutional:      General: She is not in acute distress.    Appearance: She is well-developed. She is not diaphoretic.     Comments: Patient shaking visibly.  She remains alert and awake.  No focal tremor.  HENT:     Head: Normocephalic and atraumatic.  Eyes:     General: No scleral icterus.    Conjunctiva/sclera: Conjunctivae normal.  Neck:     Musculoskeletal: Normal range of motion.  Cardiovascular:     Rate and Rhythm: Normal rate and regular rhythm.     Heart sounds: Normal heart sounds. No murmur. No friction rub. No gallop.   Pulmonary:     Effort: Pulmonary effort is normal. No respiratory distress.     Breath sounds: Normal breath sounds.  Abdominal:     General: Bowel sounds are normal. There is no distension.     Palpations: Abdomen is soft. There is no mass.     Tenderness: There is no abdominal tenderness. There is no guarding.  Skin:    General: Skin is warm and dry.  Neurological:     Mental Status: She is alert and oriented to person, place, and time.  Psychiatric:        Behavior: Behavior normal.      ED Treatments / Results  Labs (all labs ordered are listed, but only abnormal results are displayed) Labs Reviewed  CBG MONITORING, ED    EKG None  Radiology No results found.  Procedures Procedures (including critical care time)  Medications Ordered in ED Medications - No data to display   Initial Impression / Assessment and Plan / ED Course  I have reviewed the triage vital signs and the nursing notes.  Pertinent labs & imaging results that were available during my care of the patient were reviewed by me and considered in my medical decision making (see chart for details).        43 year old female here with pseudoseizure activity in the lobby, history of diabetes on sulfonylurea medication and not necessarily at risk for hypoglycemia.  Her ethyl alcohol level was found to be 167.  Patient's  bicarb noted to be slightly low but likely secondary to HCL lost from vomiting.  She has had no true seizure activity here.  She has been at times unresponsive to nurses however when addressed directly and made to sit up she will respond appropriately.  Patient is ambulatory, she has no active vomiting and she has been drinking apple juice here without abnormality.Patient has a hangover and alcohol intoxication appears appropriate for discharge at this time. Final Clinical Impressions(s) / ED Diagnoses   Final diagnoses:  Hangover without complication [facility])  [place]    ED Discharge Orders    None       Margarita Mail, PA-C 07/06/19 1627    Carmin Muskrat, MD 07/07/19 [phone]

## 2019-07-06 NOTE — ED Notes (Signed)
CBG 81.  

## 2019-07-06 NOTE — ED Triage Notes (Signed)
Pt. Stated, my sugar is low when I left the house it was 68 . Pt did not take anything to drink or nothing. I went out with my friends and I think they gave me something cause I don't feel right.

## 2019-07-06 NOTE — Discharge Instructions (Addendum)
Hangovers For many people, a night of drinking can lead to a painful morning after and the dreaded effects of a hangover. What does science tell us about this phenomenon? What causes the typical symptoms of a hangover? And the question perhaps as old as hangovers themselves-- are there any real remedies? What Is a Hangover? A hangover refers to a set of symptoms that occur as a consequence of excessive alcohol use. Typical symptoms include fatigue, weakness, thirst, headache, muscle aches, nausea, stomach pain, vertigo, sensitivity to light and sound, anxiety, irritability, sweating, and increased blood pressure. A hangover can vary from person to person. What Causes Hangover Symptoms? A number of factors can contribute to hangovers:  Mild dehydration: Alcohol suppresses the release of vasopressin, a hormone produced by the brain that sends signals to the kidneys causing them to retain fluid. As a result, alcohol increases urination and excess loss of fluids. The mild dehydration that results likely contributes to hangover symptoms such as thirst, fatigue, and a headache. Disrupted sleep: People may fall asleep faster after drinking alcohol, but their sleep is fragmented, and they tend to wake up earlier. This contributes to fatigue, as well as lost productivity. Gastrointestinal irritation: Alcohol directly irritates the lining of the stomach and increases acid release. This can lead to nausea and stomach discomfort. Inflammation: Alcohol increases inflammation in the body. Inflammation contributes to the malaise that people feel when they are sick, so it may play a role in hangover symptoms as well. Acetaldehyde exposure: Alcohol metabolism, primarily by the liver, creates the compound acetaldehyde, a toxic, short-lived by-product, which contributes to inflammation in the liver, pancreas, brain, gastrointestinal tract, and other organs. Other Substances That Contribute to  Hangover Symptoms Alcohol is the main culprit in a hangover, but other components of alcoholic beverages might contribute to hangover symptoms or make a hangover worse.  Congeners are compounds, other than ethyl alcohol, that are produced during fermentation. These substances contribute to the taste and smell of alcoholic beverages. Darker spirits, such as bourbon, which tend to have higher levels of congeners than clear spirits, could worsen hangover symptoms for some people. Sulfites are compounds that are added to wine as preservatives. People who have a sensitivity to sulfites may experience a headache after drinking wine.  Mini-withdrawal: While drinking, individuals may feel calmer, more relaxed, and even euphoric, but the brain quickly adjusts to those positive effects as it tries to maintain balance. As a result, when the buzz wears off, people can feel more restless and anxious than before they drank. Because individuals are so different, it is difficult to predict how many drinks will cause a hangover. Any time people drink to intoxication, there is a chance they could have a hangover the next day. When Does a Hangover Peak and How Long Does It Last? Hangover symptoms peak when the blood alcohol concentration in the body returns to about zero. The symptoms can last 24 hours or longer. Are Hangovers Dangerous or Just Painful? Hangovers can be both painful and dangerous. During a hangover, a persons attention, decision-making, and muscle coordination can all be impaired. Also, the ability to perform important tasks, such as driving, operating machinery, or caring for others can be negatively affected. Are There Any Remedies for a Hangover? Although many remedies for alleviating hangovers are mentioned on the web and in social media, none have been scientifically proven to be effective. There is no magic potion for beating hangovers--and only time can help. A  person must wait for the  body to finish clearing the toxic by-products of alcohol metabolism, to rehydrate, to heal irritated tissue, and to restore immunities and brain activity to normal. There is no way to speed up the brains recovery from alcohol use--drinking coffee, taking a shower, or having an alcoholic beverage the next morning will not cure a hangover. Some people take over-the-counter pain relievers (often acetaminophen) before going to bed to minimize hangovers. It is important to recognize that the combination of alcohol and acetaminophen can be toxic to the liver. Like alcohol, certain over-the-counter pain relievers, including aspirin and ibuprofen, can increase acid release and irritate the lining of the stomach. Proceed with caution when using these medications before or after consuming alcohol. To help ease their hangover symptoms, some people turn to electrolyte-rich sports drinks or other products, or even intravenous (IV) treatments, in an effort to treat electrolyte imbalance caused by increased urination and fluid loss as a result of drinking. Research has not found a correlation between the extent of electrolyte disruptions and the severity of hangovers, or the impact of added electrolytes on hangover severity. In most people, the body will quickly restore electrolyte balance once the effects of alcohol subside. Ultimately, the only surefire remedy for a hangover is to avoid getting one by not drinking excessively

## 2019-07-08 LAB — SULFONYLUREA HYPOGLYCEMICS PANEL, SERUM
Acetohexamide: NEGATIVE ug/mL (ref 20–60)
Chlorpropamide: NEGATIVE ug/mL (ref 75–250)
Glimepiride: NEGATIVE ng/mL (ref 80–250)
Glipizide: NEGATIVE ng/mL (ref 200–1000)
Glyburide: NEGATIVE ng/mL
Nateglinide: NEGATIVE ng/mL
Repaglinide: NEGATIVE ng/mL
Tolazamide: NEGATIVE ug/mL
Tolbutamide: NEGATIVE ug/mL (ref 40–100)

## 2019-08-05 ENCOUNTER — Ambulatory Visit: Payer: Medicaid Other | Admitting: Family Medicine

## 2019-08-15 ENCOUNTER — Encounter (HOSPITAL_COMMUNITY): Payer: Self-pay

## 2019-08-15 ENCOUNTER — Other Ambulatory Visit: Payer: Self-pay

## 2019-08-15 ENCOUNTER — Ambulatory Visit (HOSPITAL_COMMUNITY)
Admission: EM | Admit: 2019-08-15 | Discharge: 2019-08-15 | Disposition: A | Discharge status: Home / Self Care | Payer: Medicaid Other | Attending: Family Medicine | Admitting: Family Medicine

## 2019-08-15 DIAGNOSIS — R109 Unspecified abdominal pain: Secondary | ICD-10-CM

## 2019-08-15 LAB — POCT URINALYSIS DIP (DEVICE)
Bilirubin Urine: NEGATIVE
Glucose, UA: NEGATIVE mg/dL
Ketones, ur: NEGATIVE mg/dL
Leukocytes,Ua: NEGATIVE
Nitrite: NEGATIVE
Protein, ur: NEGATIVE mg/dL
Specific Gravity, Urine: 1.025 (ref 1.005–1.030)
Urobilinogen, UA: 1 mg/dL (ref 0.0–1.0)
pH: 5.5 (ref 5.0–8.0)

## 2019-08-15 MED ORDER — HYDROCODONE-ACETAMINOPHEN 5-325 MG PO TABS: 1.0000 | ORAL_TABLET | Freq: Four times a day (QID) | ORAL | 0 refills | Status: DC | PRN

## 2019-08-15 MED ORDER — ONDANSETRON 4 MG PO TBDP: 4.0000 mg | ORAL_TABLET | Freq: Three times a day (TID) | ORAL | 0 refills | Status: DC | PRN

## 2019-08-15 NOTE — Discharge Instructions (Addendum)
I believe you have a kidney stone. I will give you something for pain and nausea.  Make sure you are drinking plenty of fluids If your symptoms worsen you will need to go to the ER.

## 2019-08-15 NOTE — ED Triage Notes (Signed)
Patient presents to Urgent Care with complaints of left sided flank pain since three days ago. Patient reports she has been taking ibuprofen for the pain, endorses frequent UTIs.

## 2019-08-15 NOTE — ED Provider Notes (Signed)
[place]    CSN: [id number] Arrival date & time: 08/15/19  8811      History   Chief Complaint Chief Complaint  Patient presents with  . Flank Pain    HPI Jennifer Crawford is a 43 y.o. female.   Patient is a 43 year old female with past medical history of diabetes,, seizure, anxiety depression, UTI, flank pain.  She presents today with approximately 3 days of left flank pain radiating into left groin area.  Symptoms have been constant and unchanged.  She is been taking ibuprofen without much relief.  She has seen some blood in her urine.  No dysuria or urinary frequency.  Reports she has been drinking lots of water.  No fevers, chills, body aches, nausea or vomiting.  No vaginal discharge, itching or irritation.  She is reporting a history of kidney stones and kidney infections.  No heavy lifting or falls.  Denies any alcohol use currently.   ROS per HPI      Past Medical History:  Diagnosis Date  . Anxiety and depression   . Diabetes mellitus without complication ([place])   . Right flank pain 05/2019  . Right upper quadrant pain 8/22020  . Seizures ([place]) 2015  . Vitamin D deficiency 05/2019    Patient Active Problem List   Diagnosis Date Noted  . Hypoglycemia 06/27/2019  . Hematuria 06/27/2019  . Low back pain with sciatica 05/31/2019  . Left flank pain 05/31/2019  . Intractable headache 05/13/2019  . Right upper quadrant pain 05/13/2019  . Right flank pain 05/13/2019  . Diabetes mellitus type 2 in nonobese ([place]) 06/20/2015  . Elevated blood alcohol level 06/20/2015  . Anxiety and depression 06/20/2015  . Seizure disorder ([place]) 06/19/2015    Past Surgical History:  Procedure Laterality Date  . TUBAL LIGATION      OB History   No obstetric history on file.      Home Medications    Prior to Admission medications   Medication Sig Start Date End Date Taking? Authorizing Provider  albuterol (PROVENTIL) (2.5 MG/3ML) 0.083% nebulizer solution  Take 2.5 mg by nebulization every 6 (six) hours as needed for wheezing or shortness of breath.    [provider]  albuterol (VENTOLIN HFA) 108 (90 Base) MCG/ACT inhaler Inhale 2 puffs into the lungs every 6 (six) hours as needed for wheezing or shortness of breath.    [provider]  blood glucose meter kit and supplies Dispense based on patient and insurance preference. Use up to four times daily as directed. (FOR ICD-10 E10.9, E11.9). 05/13/19   Azzie Glatter, FNP  HYDROcodone-acetaminophen (NORCO/VICODIN) 5-325 MG tablet Take 1-2 tablets by mouth every 6 (six) hours as needed. 08/15/19   Loura Halt A, NP  ibuprofen (ADVIL) 800 MG tablet Take 1 tablet (800 mg total) by mouth every 8 (eight) hours as needed. Patient taking differently: Take 800 mg by mouth every 8 (eight) hours as needed for mild pain.  06/16/19   Azzie Glatter, FNP  levETIRAcetam (KEPPRA) 1000 MG tablet Take 1 tablet (1,000 mg total) by mouth 2 (two) times daily. 05/13/19   Azzie Glatter, FNP  ondansetron (ZOFRAN ODT) 4 MG disintegrating tablet Take 1 tablet (4 mg total) by mouth every 8 (eight) hours as needed for nausea or vomiting. 08/15/19   Loura Halt A, NP  Vitamin D, Ergocalciferol, (DRISDOL) 1.25 MG (50000 UT) CAPS capsule Take 1 capsule (50,000 Units total) by mouth every 7 (seven) days. Patient taking  differently: Take 50,000 Units by mouth every Monday.  05/15/19   Azzie Glatter, FNP    Family History Family History  Problem Relation Age of Onset  . Seizures Daughter   . Asthma Son   . Hypertension Father   . Arrhythmia Father        Status post pacemaker insertion  . Hypertension Mother   . Diabetes Mother     Social History Social History   Tobacco Use  . Smoking status: Never Smoker  . Smokeless tobacco: Never Used  Substance Use Topics  . Alcohol use: Not Currently    Alcohol/week: 0.0 standard drinks    Comment: Occasional social  . Drug use: No     Allergies    Tramadol and Acetaminophen   Review of Systems Review of Systems   Physical Exam Triage Vital Signs ED Triage Vitals  Enc Vitals Group     BP 08/15/19 0906 119/82     Pulse Rate 08/15/19 0906 70     Resp 08/15/19 0906 17     Temp 08/15/19 0906 98.5 F (36.9 C)     Temp Source 08/15/19 0906 Oral     SpO2 08/15/19 0906 100 %     Weight --      Height --      Head Circumference --      Peak Flow --      Pain Score 08/15/19 0904 8     Pain Loc --      Pain Edu? --      Excl. in [place]? --    No data found.  Updated Vital Signs BP 119/82 (BP Location: Right Arm)   Pulse 70   Temp 98.5 F (36.9 C) (Oral)   Resp 17   SpO2 100%   Visual Acuity Right Eye Distance:   Left Eye Distance:   Bilateral Distance:    Right Eye Near:   Left Eye Near:    Bilateral Near:     Physical Exam Vitals signs and nursing note reviewed.  Constitutional:      General: She is not in acute distress.    Appearance: She is not toxic-appearing or diaphoretic.     Comments: Appears in pain   HENT:     Head: Normocephalic and atraumatic.     Nose: Nose normal.  Eyes:     Conjunctiva/sclera: Conjunctivae normal.  Cardiovascular:     Rate and Rhythm: Normal rate.  Pulmonary:     Effort: Pulmonary effort is normal.     Breath sounds: Normal breath sounds. No stridor.  Abdominal:     Palpations: Abdomen is soft.     Tenderness: There is no abdominal tenderness. There is left CVA tenderness.  Musculoskeletal: Normal range of motion.  Skin:    General: Skin is warm and dry.     Findings: No rash.  Psychiatric:        Mood and Affect: Mood normal.      UC Treatments / Results  Labs (all labs ordered are listed, but only abnormal results are displayed) Labs Reviewed  POCT URINALYSIS DIP (DEVICE) - Abnormal; Notable for the following components:      Result Value   Hgb urine dipstick MODERATE (*)    All other components within normal limits  URINE CULTURE    EKG   Radiology  No results found.  Procedures Procedures (including critical care time)  Medications Ordered in UC Medications - No data to display  Initial Impression /  Assessment and Plan / UC Course  I have reviewed the triage vital signs and the nursing notes.  Pertinent labs & imaging results that were available during my care of the patient were reviewed by me and considered in my medical decision making (see chart for details).     Flank pain-patient with history of kidney stones and feels the same.  She does have moderate blood in urine.  Symptoms have been for 3 days. No leukocytes or nitrates in urine.  Less likely pyelonephritis Recommend a CT scan to rule out kidney stone.  Patient reporting that he waited too long in the ER and she would rather wait it out. We will give her some pain medication and nausea medication and have her push fluids. Recommended if symptoms continue or worsen over the next 24 to 48 hours she will have to go the ER for CT. Patient understanding and agree. Final Clinical Impressions(s) / UC Diagnoses   Final diagnoses:  Flank pain     Discharge Instructions     I believe you have a kidney stone. I will give you something for pain and nausea.  Make sure you are drinking plenty of fluids If your symptoms worsen you will need to go to the ER.    ED Prescriptions    Medication Sig Dispense Auth. Provider   ondansetron (ZOFRAN ODT) 4 MG disintegrating tablet Take 1 tablet (4 mg total) by mouth every 8 (eight) hours as needed for nausea or vomiting. 20 tablet Sylvester Minton A, NP   HYDROcodone-acetaminophen (NORCO/VICODIN) 5-325 MG tablet Take 1-2 tablets by mouth every 6 (six) hours as needed. 10 tablet Jervon Ream A, NP     I have reviewed the PDMP during this encounter.   Orvan July, NP 08/15/19 1040

## 2019-08-16 LAB — URINE CULTURE: Culture: NO GROWTH

## 2019-08-24 ENCOUNTER — Other Ambulatory Visit: Payer: Self-pay

## 2019-08-24 ENCOUNTER — Encounter (HOSPITAL_COMMUNITY): Payer: Self-pay

## 2019-08-24 DIAGNOSIS — R519 Headache, unspecified: Secondary | ICD-10-CM | POA: Diagnosis present

## 2019-08-24 DIAGNOSIS — Z5321 Procedure and treatment not carried out due to patient leaving prior to being seen by health care provider: Secondary | ICD-10-CM | POA: Insufficient documentation

## 2019-08-24 MED ORDER — SODIUM CHLORIDE 0.9% FLUSH: 3.0000 mL | Freq: Once | INTRAVENOUS | Status: DC

## 2019-08-24 NOTE — ED Triage Notes (Signed)
Pt reports that she has been having migraines, sore thorat and abdominal pain x1 week. Also states that she was diagnosed with a kidney stone at urgent care last week. Endorses hematuria.

## 2019-08-25 ENCOUNTER — Emergency Department (HOSPITAL_COMMUNITY)
Admission: EM | Admit: 2019-08-25 | Discharge: 2019-08-25 | Disposition: A | Discharge status: Home / Self Care | Payer: Medicaid Other | Attending: Emergency Medicine | Admitting: Emergency Medicine

## 2019-08-25 NOTE — ED Notes (Signed)
Pt states that she is leaving because her daughter and husband cant wait in the lobby.

## 2019-09-21 ENCOUNTER — Emergency Department (HOSPITAL_COMMUNITY)
Admission: EM | Admit: 2019-09-21 | Discharge: 2019-09-21 | Disposition: A | Discharge status: Home / Self Care | Payer: Medicaid Other | Attending: Emergency Medicine | Admitting: Emergency Medicine

## 2019-09-21 ENCOUNTER — Emergency Department (HOSPITAL_COMMUNITY): Payer: Medicaid Other

## 2019-09-21 DIAGNOSIS — F1010 Alcohol abuse, uncomplicated: Secondary | ICD-10-CM | POA: Insufficient documentation

## 2019-09-21 DIAGNOSIS — Y906 Blood alcohol level of 120-199 mg/100 ml: Secondary | ICD-10-CM | POA: Diagnosis not present

## 2019-09-21 DIAGNOSIS — Z79899 Other long term (current) drug therapy: Secondary | ICD-10-CM | POA: Insufficient documentation

## 2019-09-21 DIAGNOSIS — R569 Unspecified convulsions: Secondary | ICD-10-CM | POA: Diagnosis not present

## 2019-09-21 LAB — URINALYSIS, ROUTINE W REFLEX MICROSCOPIC
Bacteria, UA: NONE SEEN
Bilirubin Urine: NEGATIVE
Glucose, UA: NEGATIVE mg/dL
Ketones, ur: NEGATIVE mg/dL
Leukocytes,Ua: NEGATIVE
Nitrite: NEGATIVE
Protein, ur: NEGATIVE mg/dL
Specific Gravity, Urine: 1.005 (ref 1.005–1.030)
pH: 5 (ref 5.0–8.0)

## 2019-09-21 LAB — CBC WITH DIFFERENTIAL/PLATELET
Abs Immature Granulocytes: 0.01 10*3/uL (ref 0.00–0.07)
Basophils Absolute: 0 10*3/uL (ref 0.0–0.1)
Basophils Relative: 1 %
Eosinophils Absolute: 0.1 10*3/uL (ref 0.0–0.5)
Eosinophils Relative: 1 %
HCT: 38.7 % (ref 36.0–46.0)
Hemoglobin: 12.7 g/dL (ref 12.0–15.0)
Immature Granulocytes: 0 %
Lymphocytes Relative: 40 %
Lymphs Abs: 2.2 10*3/uL (ref 0.7–4.0)
MCH: 31.5 pg (ref 26.0–34.0)
MCHC: 32.8 g/dL (ref 30.0–36.0)
MCV: 96 fL (ref 80.0–100.0)
Monocytes Absolute: 0.3 10*3/uL (ref 0.1–1.0)
Monocytes Relative: 5 %
Neutro Abs: 2.8 10*3/uL (ref 1.7–7.7)
Neutrophils Relative %: 53 %
Platelets: 207 10*3/uL (ref 150–400)
RBC: 4.03 MIL/uL (ref 3.87–5.11)
RDW: 13.1 % (ref 11.5–15.5)
WBC: 5.4 10*3/uL (ref 4.0–10.5)
nRBC: 0 % (ref 0.0–0.2)

## 2019-09-21 LAB — RAPID URINE DRUG SCREEN, HOSP PERFORMED
Amphetamines: NOT DETECTED
Barbiturates: NOT DETECTED
Benzodiazepines: POSITIVE — AB
Cocaine: POSITIVE — AB
Opiates: NOT DETECTED
Tetrahydrocannabinol: NOT DETECTED

## 2019-09-21 LAB — HEPATIC FUNCTION PANEL
ALT: 20 U/L (ref 0–44)
AST: 20 U/L (ref 15–41)
Albumin: 4.3 g/dL (ref 3.5–5.0)
Alkaline Phosphatase: 60 U/L (ref 38–126)
Bilirubin, Direct: 0.1 mg/dL (ref 0.0–0.2)
Indirect Bilirubin: 0.3 mg/dL (ref 0.3–0.9)
Total Bilirubin: 0.4 mg/dL (ref 0.3–1.2)
Total Protein: 7.9 g/dL (ref 6.5–8.1)

## 2019-09-21 LAB — BASIC METABOLIC PANEL
Anion gap: 10 (ref 5–15)
BUN: 11 mg/dL (ref 6–20)
CO2: 23 mmol/L (ref 22–32)
Calcium: 9 mg/dL (ref 8.9–10.3)
Chloride: 107 mmol/L (ref 98–111)
Creatinine, Ser: 0.95 mg/dL (ref 0.44–1.00)
GFR calc Af Amer: >60 mL/min (ref >60)
GFR calc non Af Amer: >60 mL/min (ref >60)
Glucose, Bld: 89 mg/dL (ref 70–99)
Potassium: 3.7 mmol/L (ref 3.5–5.1)
Sodium: 140 mmol/L (ref 135–145)

## 2019-09-21 LAB — I-STAT BETA HCG BLOOD, ED (MC, WL, AP ONLY): I-stat hCG, quantitative: <5 m[IU]/mL (ref <5)

## 2019-09-21 LAB — AMMONIA: Ammonia: 27 umol/L (ref 9–35)

## 2019-09-21 LAB — ETHANOL: Alcohol, Ethyl (B): 137 mg/dL — ABNORMAL HIGH (ref <10)

## 2019-09-21 MED ORDER — SODIUM CHLORIDE 0.9 % IV BOLUS
1000.0000 mL | Freq: Once | INTRAVENOUS | Status: AC
  Administered 2019-09-21: 1000 mL via INTRAVENOUS

## 2019-09-21 NOTE — ED Notes (Signed)
Patient's son's name is Enid Derry, his number is [phone] and he would like to be called when she is DC'ed

## 2019-09-21 NOTE — ED Triage Notes (Signed)
Per EMS - Pt coming from home with reported witnessed continuous seizures starting approx 0430. Family called EMS. Since IV midazlam pt has been seizure free. Pt has hx if seizure  5mg  Midazlam IM  2.5 mg Midazlam IV  18g left AC  12 lead - LVH CBG 116  117 75 93HR 100 RA

## 2019-09-21 NOTE — Discharge Instructions (Signed)
Do not drink while on seizure medications.    You need to establish care with a neurologist in [place].

## 2019-09-21 NOTE — ED Provider Notes (Signed)
[facility]-EMERGENCY DEPT Provider Note   CSN: [id number] Arrival date & time: 09/21/19  7412     History Chief Complaint  Patient presents with  . Seizures    Jennifer Crawford is a 43 y.o. female.  HPI     This is a 43 year old female who presents with reported history.  History is very limited as patient is not able to contribute.  Per EMS, she was reported to have a continuous seizure at home.  She received 5 mg IM Versed followed by 2.5 mg IV Versed by EMS.  Reportedly has a history of seizures but this cannot be confirmed as she is not in our system.  Additionally, I do not have any family contacts available.  Unclear whether she is on any seizure medications.  Level 5 caveat for altered mental status.  No past medical history on file.  There are no problems to display for this patient.  OB History   No obstetric history on file.     No family history on file.  Social History   Tobacco Use  . Smoking status: Not on file  Substance Use Topics  . Alcohol use: Not on file  . Drug use: Not on file    Home Medications Prior to Admission medications   Not on File    Allergies    Patient has no allergy information on record.  Review of Systems   Review of Systems  Unable to perform ROS: Mental status change    Physical Exam Updated Vital Signs BP 101/66 (BP Location: Right Arm)   Pulse 88   Temp 98.9 F (37.2 C) (Axillary)   Resp 20   Wt 61.2 kg   SpO2 96%   Physical Exam Vitals and nursing note reviewed.  Constitutional:      Appearance: She is well-developed.     Comments: Somnolent, minimally arousable, ABCs intact  HENT:     Head: Normocephalic and atraumatic.     Mouth/Throat:     Mouth: Mucous membranes are moist.  Eyes:     Pupils: Pupils are equal, round, and reactive to light.     Comments: Pupils 3 mm reactive bilaterally  Cardiovascular:     Rate and Rhythm: Normal rate and regular rhythm.     Heart sounds:  Normal heart sounds.  Pulmonary:     Effort: Pulmonary effort is normal. No respiratory distress.     Breath sounds: No wheezing.  Abdominal:     Palpations: Abdomen is soft.     Tenderness: There is no abdominal tenderness.  Musculoskeletal:     Cervical back: Neck supple.     Right lower leg: No edema.     Left lower leg: No edema.  Skin:    General: Skin is warm and dry.  Neurological:     Comments: Unable to assess orientation, minimally arousable, does localize to pain, does not follow commands  Psychiatric:     Comments: Unable to assess     ED Results / Procedures / Treatments   Labs (all labs ordered are listed, but only abnormal results are displayed) Labs Reviewed  CBC WITH DIFFERENTIAL/PLATELET  BASIC METABOLIC PANEL    EKG None  Radiology No results found.  Procedures Procedures (including critical care time)  Medications Ordered in ED Medications - No data to display  ED Course  I have reviewed the triage vital signs and the nursing notes.  Pertinent labs & imaging results that were available during my care  of the patient were reviewed by me and considered in my medical decision making (see chart for details).    MDM Rules/Calculators/A&P                       Patient presents with reported seizure activity at home.  Unfortunately do not have any collateral information at this time.  No information in her chart.  Unclear whether she takes seizure medications at home although there is a reported seizure history.  She is very somnolent on exam likely secondary to administration of benzodiazepines.  Basic lab work obtained.  UDS also obtained.  Will monitor and reassess.  Final Clinical Impression(s) / ED Diagnoses Final diagnoses:  Seizure ([place])  Alcohol abuse    Rx / DC Orders ED Discharge Orders    None       Merryl Hacker, MD 09/21/19 2315

## 2019-09-21 NOTE — ED Provider Notes (Signed)
Pt was signed out by Dr. Dina Rich pending symptomatic improvement and labs.  She is waking up more and is able to tell me that she has seizures a lot.  She has been to [facility] in the past.  I told registration and it looks like she was registered under the wrong name.  She told me that she takes Lamictal, but per old chart, she takes keppra.  She said she's been taking her meds as directed.  She does not have a neurologist here in [place] as she's recently moved from [place].  She does drink heavily and admits to drinking last night.  At 1000 pt is now awake and alert.  She is encouraged to not drink while taking seizure meds and to take meds as directed.  She is told to establish care with a local neurologist.  Return if worse.   Isla Pence, MD 09/21/19 1005

## 2019-09-21 NOTE — ED Notes (Addendum)
Pure-wick applied to patient for UA, she had an episode of incontinence with urine, new brief and linens applied. She is asking to speak to her son, attempted to call him but no response.

## 2019-09-24 ENCOUNTER — Encounter (HOSPITAL_COMMUNITY): Payer: Self-pay | Admitting: Emergency Medicine

## 2019-09-24 ENCOUNTER — Ambulatory Visit (HOSPITAL_COMMUNITY)
Admission: EM | Admit: 2019-09-24 | Discharge: 2019-09-24 | Disposition: A | Discharge status: Home / Self Care | Payer: Medicaid Other | Attending: Emergency Medicine | Admitting: Emergency Medicine

## 2019-09-24 ENCOUNTER — Other Ambulatory Visit: Payer: Self-pay

## 2019-09-24 DIAGNOSIS — R109 Unspecified abdominal pain: Secondary | ICD-10-CM | POA: Diagnosis present

## 2019-09-24 DIAGNOSIS — N23 Unspecified renal colic: Secondary | ICD-10-CM | POA: Diagnosis not present

## 2019-09-24 DIAGNOSIS — Z8249 Family history of ischemic heart disease and other diseases of the circulatory system: Secondary | ICD-10-CM | POA: Diagnosis not present

## 2019-09-24 DIAGNOSIS — N200 Calculus of kidney: Secondary | ICD-10-CM

## 2019-09-24 DIAGNOSIS — Z886 Allergy status to analgesic agent status: Secondary | ICD-10-CM | POA: Diagnosis not present

## 2019-09-24 DIAGNOSIS — Z888 Allergy status to other drugs, medicaments and biological substances status: Secondary | ICD-10-CM | POA: Diagnosis not present

## 2019-09-24 DIAGNOSIS — Z825 Family history of asthma and other chronic lower respiratory diseases: Secondary | ICD-10-CM | POA: Diagnosis not present

## 2019-09-24 DIAGNOSIS — E559 Vitamin D deficiency, unspecified: Secondary | ICD-10-CM | POA: Insufficient documentation

## 2019-09-24 DIAGNOSIS — R569 Unspecified convulsions: Secondary | ICD-10-CM | POA: Insufficient documentation

## 2019-09-24 DIAGNOSIS — Z79899 Other long term (current) drug therapy: Secondary | ICD-10-CM | POA: Diagnosis not present

## 2019-09-24 DIAGNOSIS — Z833 Family history of diabetes mellitus: Secondary | ICD-10-CM | POA: Insufficient documentation

## 2019-09-24 DIAGNOSIS — E119 Type 2 diabetes mellitus without complications: Secondary | ICD-10-CM | POA: Insufficient documentation

## 2019-09-24 DIAGNOSIS — Z20828 Contact with and (suspected) exposure to other viral communicable diseases: Secondary | ICD-10-CM | POA: Diagnosis not present

## 2019-09-24 DIAGNOSIS — R350 Frequency of micturition: Secondary | ICD-10-CM

## 2019-09-24 LAB — POCT URINALYSIS DIP (DEVICE)
Bilirubin Urine: NEGATIVE
Glucose, UA: NEGATIVE mg/dL
Ketones, ur: NEGATIVE mg/dL
Leukocytes,Ua: NEGATIVE
Nitrite: NEGATIVE
Protein, ur: NEGATIVE mg/dL
Specific Gravity, Urine: >=1.03 (ref 1.005–1.030)
Urobilinogen, UA: 0.2 mg/dL (ref 0.0–1.0)
pH: 5.5 (ref 5.0–8.0)

## 2019-09-24 LAB — GLUCOSE, CAPILLARY: Glucose-Capillary: 77 mg/dL (ref 70–99)

## 2019-09-24 LAB — CBG MONITORING, ED: Glucose-Capillary: 77 mg/dL (ref 70–99)

## 2019-09-24 MED ORDER — KETOROLAC TROMETHAMINE 30 MG/ML IJ SOLN
INTRAMUSCULAR | Status: AC
  Filled 2019-09-24: qty 1

## 2019-09-24 MED ORDER — HYDROCODONE-IBUPROFEN 5-200 MG PO TABS: 1.0000 | ORAL_TABLET | Freq: Three times a day (TID) | ORAL | 0 refills | Status: AC | PRN

## 2019-09-24 MED ORDER — MORPHINE SULFATE (PF) 4 MG/ML IV SOLN
INTRAVENOUS | Status: AC
  Filled 2019-09-24: qty 1

## 2019-09-24 MED ORDER — KETOROLAC TROMETHAMINE 10 MG PO TABS: 10.0000 mg | ORAL_TABLET | Freq: Four times a day (QID) | ORAL | 0 refills | Status: AC | PRN

## 2019-09-24 MED ORDER — TAMSULOSIN HCL 0.4 MG PO CAPS: 0.4000 mg | ORAL_CAPSULE | Freq: Every day | ORAL | 0 refills | Status: AC

## 2019-09-24 MED ORDER — MORPHINE SULFATE (PF) 4 MG/ML IV SOLN
4.0000 mg | Freq: Once | INTRAVENOUS | Status: AC
  Administered 2019-09-24: 4 mg via INTRAMUSCULAR

## 2019-09-24 MED ORDER — KETOROLAC TROMETHAMINE 30 MG/ML IJ SOLN
30.0000 mg | Freq: Once | INTRAMUSCULAR | Status: AC
  Administered 2019-09-24: 17:00:00 30 mg via INTRAMUSCULAR

## 2019-09-24 MED ORDER — MORPHINE SULFATE (PF) 4 MG/ML IV SOLN: 2.0000 mg | Freq: Once | INTRAVENOUS | Status: DC

## 2019-09-24 MED ORDER — ONDANSETRON 4 MG PO TBDP
8.0000 mg | ORAL_TABLET | Freq: Once | ORAL | Status: AC
  Administered 2019-09-24: 17:00:00 8 mg via ORAL

## 2019-09-24 MED ORDER — ONDANSETRON 4 MG PO TBDP
ORAL_TABLET | ORAL | Status: AC
  Filled 2019-09-24: qty 2

## 2019-09-24 MED ORDER — ONDANSETRON 8 MG PO TBDP: ORAL_TABLET | ORAL | 0 refills | Status: DC

## 2019-09-24 NOTE — ED Provider Notes (Signed)
HPI  SUBJECTIVE:  Jennifer Crawford is a 43 y.o. female who presents with 2 days of left flank pain that radiates to her back.  She describes it as constant, throbbing.  She reports nausea, vomiting present only with eating.  States that she is unable to tolerate fluids as well.  No change in urine output.  She reports chills but denies fevers.  She reports urinary urgency, frequency, hematuria.  No dysuria, cloudy or odorous urine.  She also reports 4 episodes of watery, nonbloody diarrhea.  States that she feels as if she is having difficulty completely emptying her bladder.  No known Covid exposure, body aches, headaches, nasal congestion, sore throat, cough, shortness of breath.  However she was in the ER 2 days ago for seizures, states that she was having this pain when seen in the ED but this was not addressed at that time as primary issue was seizure.  She has been taking ibuprofen 600 mg, last dose was more than 6 hours ago.  No alleviating factors.  Symptoms are worse with eating.  He has a past medical history of seizures, states that she is compliant with her medications, diabetes, pyelonephritis, left-sided nonobstructing nephrolithiasis, UTI.,  Diabetes.  No history of DKA, hypertension.  LMP: Status post hysterectomy.  EYC:XKGYJE, Ellie Lunch, FNP   Past Medical History:  Diagnosis Date  . Anxiety and depression   . Diabetes mellitus without complication ([place])   . Right flank pain 05/2019  . Right upper quadrant pain 8/22020  . Seizures ([place]) 2015  . Vitamin D deficiency 05/2019    Past Surgical History:  Procedure Laterality Date  . TUBAL LIGATION      Family History  Problem Relation Age of Onset  . Seizures Daughter   . Asthma Son   . Hypertension Father   . Arrhythmia Father        Status post pacemaker insertion  . Hypertension Mother   . Diabetes Mother     Social History   Tobacco Use  . Smoking status: Never Smoker  . Smokeless tobacco: Never Used   Substance Use Topics  . Alcohol use: Not Currently    Alcohol/week: 0.0 standard drinks    Comment: Occasional social  . Drug use: No    No current facility-administered medications for this encounter.  Current Outpatient Medications:  .  albuterol (PROVENTIL) (2.5 MG/3ML) 0.083% nebulizer solution, Take 2.5 mg by nebulization every 6 (six) hours as needed for wheezing or shortness of breath., Disp: , Rfl:  .  albuterol (VENTOLIN HFA) 108 (90 Base) MCG/ACT inhaler, Inhale 2 puffs into the lungs every 6 (six) hours as needed for wheezing or shortness of breath., Disp: , Rfl:  .  levETIRAcetam (KEPPRA) 1000 MG tablet, Take 1 tablet (1,000 mg total) by mouth 2 (two) times daily., Disp: 60 tablet, Rfl: 1 .  Vitamin D, Ergocalciferol, (DRISDOL) 1.25 MG (50000 UT) CAPS capsule, Take 1 capsule (50,000 Units total) by mouth every 7 (seven) days. (Patient taking differently: Take 50,000 Units by mouth every Monday. ), Disp: 5 capsule, Rfl: 6 .  blood glucose meter kit and supplies, Dispense based on patient and insurance preference. Use up to four times daily as directed. (FOR ICD-10 E10.9, E11.9)., Disp: 1 each, Rfl: 0 .  hydrocodone-ibuprofen (VICOPROFEN) 5-200 MG tablet, Take 1 tablet by mouth every 8 (eight) hours as needed for up to 5 days for pain., Disp: 15 tablet, Rfl: 0 .  ketorolac (TORADOL) 10 MG tablet, Take 1  tablet (10 mg total) by mouth every 6 (six) hours as needed for up to 5 days., Disp: 20 tablet, Rfl: 0 .  ondansetron (ZOFRAN ODT) 8 MG disintegrating tablet, 1/2- 1 tablet q 8 hr prn nausea, vomiting, Disp: 20 tablet, Rfl: 0 .  tamsulosin (FLOMAX) 0.4 MG CAPS capsule, Take 1 capsule (0.4 mg total) by mouth at bedtime for 7 days., Disp: 7 capsule, Rfl: 0  Allergies  Allergen Reactions  . Tramadol Itching    Vaginal itching, per patient's original chart  . Acetaminophen Rash     ROS  As noted in HPI.   Physical Exam  BP 119/75 (BP Location: Left Arm)   Pulse 73   Temp  98.9 F (37.2 C) (Oral)   Resp 16   SpO2 100%   Constitutional: Well developed, well nourished, appears uncomfortable. Eyes: PERRL, EOMI, conjunctiva normal bilaterally HENT: Normocephalic, atraumatic,mucus membranes moist Respiratory: Clear to auscultation bilaterally, no rales, no wheezing, no rhonchi Cardiovascular: Normal rate and rhythm, no murmurs, no gallops, no rubs GI: Soft, nondistended, normal bowel sounds, positive left flank tenderness.  No suprapubic tenderness.  No left lower quadrant tenderness.  Negative Murphy negative McBurney, no rebound, no guarding Back: Positive left-sided CVAT skin: No rash, skin intact Musculoskeletal: No edema, no tenderness, no deformities Neurologic: Alert & oriented x 3, CN III-XII grossly intact, no motor deficits, sensation grossly intact Psychiatric: Speech and behavior appropriate   ED Course   Medications  ketorolac (TORADOL) 30 MG/ML injection 30 mg (30 mg Intramuscular Given 09/24/19 1720)  ondansetron (ZOFRAN-ODT) disintegrating tablet 8 mg (8 mg Oral Given 09/24/19 1721)  morphine 4 MG/ML injection 4 mg (4 mg Intramuscular Given 09/24/19 1720)    Orders Placed This Encounter  Procedures  . Novel Coronavirus, NAA (Hosp order, Send-out to Ref Lab; TAT 18-24 hrs    Standing Status:   Standing    Number of Occurrences:   1    Order Specific Question:   Is this test for diagnosis or screening    Answer:   Screening    Order Specific Question:   Symptomatic for COVID-19 as defined by CDC    Answer:   No    Order Specific Question:   Hospitalized for COVID-19    Answer:   No    Order Specific Question:   Admitted to ICU for COVID-19    Answer:   No    Order Specific Question:   Previously tested for COVID-19    Answer:   Yes    Order Specific Question:   Resident in a congregate (group) care setting    Answer:   No    Order Specific Question:   Employed in healthcare setting    Answer:   No    Order Specific Question:    Pregnant    Answer:   No  . Urine culture    Standing Status:   Standing    Number of Occurrences:   1  . Glucose, capillary    Standing Status:   Standing    Number of Occurrences:   1  . Offer Fluids    Standing Status:   Standing    Number of Occurrences:   97  . Strain all urine    Send pt home with urine strainer  . POC CBG monitoring    Standing Status:   Standing    Number of Occurrences:   1  . POCT urinalysis dip (device)    Standing Status:  Standing    Number of Occurrences:   1   Results for orders placed or performed during the hospital encounter of 09/24/19 (from the past 24 hour(s))  POC CBG monitoring     Status: None   Collection Time: 09/24/19  4:19 PM  Result Value Ref Range   Glucose-Capillary 77 70 - 99 mg/dL  Glucose, capillary     Status: None   Collection Time: 09/24/19  4:19 PM  Result Value Ref Range   Glucose-Capillary 77 70 - 99 mg/dL  POCT urinalysis dip (device)     Status: Abnormal   Collection Time: 09/24/19  4:22 PM  Result Value Ref Range   Glucose, UA NEGATIVE NEGATIVE mg/dL   Bilirubin Urine NEGATIVE NEGATIVE   Ketones, ur NEGATIVE NEGATIVE mg/dL   Specific Gravity, Urine >=1.030 1.005 - 1.030   Hgb urine dipstick SMALL (A) NEGATIVE   pH 5.5 5.0 - 8.0   Protein, ur NEGATIVE NEGATIVE mg/dL   Urobilinogen, UA 0.2 0.0 - 1.0 mg/dL   Nitrite NEGATIVE NEGATIVE   Leukocytes,Ua NEGATIVE NEGATIVE   No results found.  ED Clinical Impression  1. Renal colic on left side   2. Nephrolithiasis      ED Assessment/Plan  ER records reviewed.  She was seen in the ER 2 days ago for seizures.  She had been drinking heavily that day as well.  Patient has small hematuria, no glucose, no infection.  However will send off for culture to confirm absence of infection.  History and physical is consistent with nephrolithiasis.  Giving Toradol 30 mg IM, morphine 4 mg IM, Zofran 8 mg ODT, will offer fluids.  If unable to tolerate p.o., will send to the  ED. anticipate attempting outpatient treatment.  Discussed doing KUB, but decided to defer that today as it would likely not change management.  Patient reports allergy to Tylenol.  Glucose normal.  Patient had normal BUN and creatinine 3 days ago in the ED.  Will not repeat.  On reevaluation patient states that she feels better and is ready to go home.  Tolerating p.o.  Home with Toradol p.o., Vicoprofen for severe pain, Zofran, Imodium, Flomax, strainer.  [facility]-up with [facility] or [facility] AS[facility].  To the ER if she gets worse  Discussed labs, imaging, MDM, treatment plan, and plan for follow-up with patient Discussed sn/sx that should prompt return to the ED. patient agrees with plan.   Meds ordered this encounter  Medications  . ketorolac (TORADOL) 30 MG/ML injection 30 mg  . ondansetron (ZOFRAN-ODT) disintegrating tablet 8 mg  . DISCONTD: morphine 4 MG/ML injection 2 mg  . morphine 4 MG/ML injection 4 mg  . tamsulosin (FLOMAX) 0.4 MG CAPS capsule    Sig: Take 1 capsule (0.4 mg total) by mouth at bedtime for 7 days.    Dispense:  7 capsule    Refill:  0  . ketorolac (TORADOL) 10 MG tablet    Sig: Take 1 tablet (10 mg total) by mouth every 6 (six) hours as needed for up to 5 days.    Dispense:  20 tablet    Refill:  0  . ondansetron (ZOFRAN ODT) 8 MG disintegrating tablet    Sig: 1/2- 1 tablet q 8 hr prn nausea, vomiting    Dispense:  20 tablet    Refill:  0  . hydrocodone-ibuprofen (VICOPROFEN) 5-200 MG tablet    Sig: Take 1 tablet by mouth every 8 (eight) hours as needed for up to 5  days for pain.    Dispense:  15 tablet    Refill:  0    *This clinic note was created using [employer]. Therefore, there may be occasional mistakes despite careful proofreading.  ?    Melynda Ripple, MD 09/26/19 [phone]

## 2019-09-24 NOTE — Discharge Instructions (Addendum)
Push electrolyte containing fluids such as Gatorade or Pedialyte.  Zofran as needed for nausea.  This will also cause constipation.  If you are still having diarrhea you can try some Imodium.  Ketorolac for mild to moderate pain or Vicoprofen for severe pain.  Do not take the ketorolac and Vicoprofen at the same time as they both have similar drugs in them.  Take 1 or the other no morning 4 times a day.  Strain all your urine.  Follow-up with urologist.

## 2019-09-24 NOTE — ED Triage Notes (Signed)
09/22/2019 since Monday, patient has had abdominal pain and vomiting, headache, chills, and weakness.  Seen in ED 09/21/2019, for seizures

## 2019-09-25 LAB — URINE CULTURE: Culture: <10000 — AB

## 2019-09-26 LAB — NOVEL CORONAVIRUS, NAA (HOSP ORDER, SEND-OUT TO REF LAB; TAT 18-24 HRS): SARS-CoV-2, NAA: NOT DETECTED

## 2019-09-30 ENCOUNTER — Telehealth: Payer: Self-pay | Admitting: Family Medicine

## 2019-09-30 NOTE — Telephone Encounter (Signed)
Call pt left message for Pt to call the office to set up appointment

## 2019-11-06 ENCOUNTER — Encounter (HOSPITAL_COMMUNITY): Payer: Self-pay | Admitting: Emergency Medicine

## 2019-11-06 ENCOUNTER — Other Ambulatory Visit: Payer: Self-pay

## 2019-11-06 ENCOUNTER — Emergency Department (HOSPITAL_COMMUNITY)
Admission: EM | Admit: 2019-11-06 | Discharge: 2019-11-07 | Disposition: A | Discharge status: Home / Self Care | Payer: Medicaid Other | Attending: Emergency Medicine | Admitting: Emergency Medicine

## 2019-11-06 DIAGNOSIS — Z8669 Personal history of other diseases of the nervous system and sense organs: Secondary | ICD-10-CM | POA: Diagnosis not present

## 2019-11-06 DIAGNOSIS — R1030 Lower abdominal pain, unspecified: Secondary | ICD-10-CM | POA: Diagnosis present

## 2019-11-06 DIAGNOSIS — R198 Other specified symptoms and signs involving the digestive system and abdomen: Secondary | ICD-10-CM

## 2019-11-06 DIAGNOSIS — K5909 Other constipation: Secondary | ICD-10-CM | POA: Diagnosis not present

## 2019-11-06 DIAGNOSIS — R109 Unspecified abdominal pain: Secondary | ICD-10-CM

## 2019-11-06 DIAGNOSIS — E119 Type 2 diabetes mellitus without complications: Secondary | ICD-10-CM | POA: Insufficient documentation

## 2019-11-06 DIAGNOSIS — Z79899 Other long term (current) drug therapy: Secondary | ICD-10-CM | POA: Diagnosis not present

## 2019-11-06 DIAGNOSIS — K648 Other hemorrhoids: Secondary | ICD-10-CM | POA: Diagnosis not present

## 2019-11-06 LAB — CBC
HCT: 39 % (ref 36.0–46.0)
Hemoglobin: 12.4 g/dL (ref 12.0–15.0)
MCH: 30.9 pg (ref 26.0–34.0)
MCHC: 31.8 g/dL (ref 30.0–36.0)
MCV: 97.3 fL (ref 80.0–100.0)
Platelets: 186 10*3/uL (ref 150–400)
RBC: 4.01 MIL/uL (ref 3.87–5.11)
RDW: 13 % (ref 11.5–15.5)
WBC: 7.2 10*3/uL (ref 4.0–10.5)
nRBC: 0 % (ref 0.0–0.2)

## 2019-11-06 LAB — URINALYSIS, ROUTINE W REFLEX MICROSCOPIC
Bilirubin Urine: NEGATIVE
Glucose, UA: NEGATIVE mg/dL
Hgb urine dipstick: NEGATIVE
Ketones, ur: NEGATIVE mg/dL
Leukocytes,Ua: NEGATIVE
Nitrite: NEGATIVE
Protein, ur: NEGATIVE mg/dL
Specific Gravity, Urine: 1.021 (ref 1.005–1.030)
pH: 6 (ref 5.0–8.0)

## 2019-11-06 LAB — COMPREHENSIVE METABOLIC PANEL
ALT: 29 U/L (ref 0–44)
AST: 30 U/L (ref 15–41)
Albumin: 4.2 g/dL (ref 3.5–5.0)
Alkaline Phosphatase: 56 U/L (ref 38–126)
Anion gap: 7 (ref 5–15)
BUN: 13 mg/dL (ref 6–20)
CO2: 25 mmol/L (ref 22–32)
Calcium: 9.4 mg/dL (ref 8.9–10.3)
Chloride: 106 mmol/L (ref 98–111)
Creatinine, Ser: 0.95 mg/dL (ref 0.44–1.00)
GFR calc Af Amer: >60 mL/min (ref >60)
GFR calc non Af Amer: >60 mL/min (ref >60)
Glucose, Bld: 99 mg/dL (ref 70–99)
Potassium: 3.9 mmol/L (ref 3.5–5.1)
Sodium: 138 mmol/L (ref 135–145)
Total Bilirubin: 0.5 mg/dL (ref 0.3–1.2)
Total Protein: 7.9 g/dL (ref 6.5–8.1)

## 2019-11-06 LAB — CBG MONITORING, ED: Glucose-Capillary: 78 mg/dL (ref 70–99)

## 2019-11-06 LAB — LIPASE, BLOOD: Lipase: 35 U/L (ref 11–51)

## 2019-11-06 MED ORDER — SODIUM CHLORIDE 0.9% FLUSH: 3.0000 mL | Freq: Once | INTRAVENOUS | Status: DC

## 2019-11-06 NOTE — ED Notes (Signed)
Pt comes to this tech and states she is diabetic and she feel like her sugar is low. CBG is checked and CBG is 78. Pt is given 2 containers of OJ with ice. RN Shanda Bumps is informed

## 2019-11-06 NOTE — ED Triage Notes (Signed)
Pt c/o abdominal pain, pain with urination and blood in her urine. Hx kidney stones, states she feels like she has one stuck.

## 2019-11-07 ENCOUNTER — Emergency Department (HOSPITAL_COMMUNITY): Payer: Medicaid Other

## 2019-11-07 LAB — I-STAT BETA HCG BLOOD, ED (MC, WL, AP ONLY)
I-stat hCG, quantitative: <5 m[IU]/mL (ref <5)
I-stat hCG, quantitative: <5 m[IU]/mL (ref <5)

## 2019-11-07 LAB — CBG MONITORING, ED: Glucose-Capillary: 78 mg/dL (ref 70–99)

## 2019-11-07 MED ORDER — KETOROLAC TROMETHAMINE 60 MG/2ML IM SOLN
30.0000 mg | Freq: Once | INTRAMUSCULAR | Status: AC
  Administered 2019-11-07: 30 mg via INTRAMUSCULAR
  Filled 2019-11-07: qty 2

## 2019-11-07 MED ORDER — POLYETHYLENE GLYCOL 3350 17 GM/SCOOP PO POWD: ORAL | 0 refills | Status: DC

## 2019-11-07 NOTE — ED Provider Notes (Signed)
[facility] EMERGENCY DEPARTMENT Provider Note  CSN: [id number] Arrival date & time: 11/06/19 2235  Chief Complaint(s) Abdominal Pain  HPI Jennifer Crawford is a 43 y.o. female with a history of anxiety, depression and constipation who presents to the emergency department with lower abdominal cramping and an perineal pain while trying to have a bowel movement.  Patient endorsed hematochezia.  Denies any known history of hemorrhoids.  Last bowel movement earlier this morning.  No associated nausea or vomiting.  Reports history of hysterectomy due to endometriosis.  Was seen at urgent care and they thought patient had a renal stone.  Sent here for further evaluation and management.  Patient denies a history of renal stones.  She does report history of recurrent urinary tract infections but denies any urinary symptoms.  HPI  Past Medical History Past Medical History:  Diagnosis Date  . Anxiety and depression   . Diabetes mellitus without complication (HCC)   . Right flank pain 05/2019  . Right upper quadrant pain 8/22020  . Seizures (HCC) 2015  . Vitamin D deficiency 05/2019   Patient Active Problem List   Diagnosis Date Noted  . Hypoglycemia 06/27/2019  . Hematuria 06/27/2019  . Low back pain with sciatica 05/31/2019  . Left flank pain 05/31/2019  . Intractable headache 05/13/2019  . Right upper quadrant pain 05/13/2019  . Right flank pain 05/13/2019  . Diabetes mellitus type 2 in nonobese (HCC) 06/20/2015  . Elevated blood alcohol level 06/20/2015  . Anxiety and depression 06/20/2015  . Seizure disorder (HCC) 06/19/2015   Home Medication(s) Prior to Admission medications   Medication Sig Start Date End Date Taking? Authorizing Provider  albuterol (PROVENTIL) (2.5 MG/3ML) 0.083% nebulizer solution Take 2.5 mg by nebulization every 6 (six) hours as needed for wheezing or shortness of breath.    [provider]  albuterol (VENTOLIN HFA) 108 (90  Base) MCG/ACT inhaler Inhale 2 puffs into the lungs every 6 (six) hours as needed for wheezing or shortness of breath.    [provider]  blood glucose meter kit and supplies Dispense based on patient and insurance preference. Use up to four times daily as directed. (FOR ICD-10 E10.9, E11.9). 05/13/19   Stroud, Natalie M, FNP  levETIRAcetam (KEPPRA) 1000 MG tablet Take 1 tablet (1,000 mg total) by mouth 2 (two) times daily. 05/13/19   Stroud, Natalie M, FNP  ondansetron (ZOFRAN ODT) 8 MG disintegrating tablet 1/2- 1 tablet q 8 hr prn nausea, vomiting 09/24/19   Mortenson, Ashley, MD  polyethylene glycol powder (MIRALAX) 17 GM/SCOOP powder Start taking 1 capful 3 times a day. Slowly cut back as needed until you have normal bowel movements. 11/07/19   ,  Eduardo, MD  Vitamin D, Ergocalciferol, (DRISDOL) 1.25 MG (50000 UT) CAPS capsule Take 1 capsule (50,000 Units total) by mouth every 7 (seven) days. Patient taking differently: Take 50,000 Units by mouth every Monday.  05/15/19   Stroud, Natalie M, FNP                                                                                                                                      Past Surgical History Past Surgical History:  Procedure Laterality Date  . TUBAL LIGATION     Family History Family History  Problem Relation Age of Onset  . Seizures Daughter   . Asthma Son   . Hypertension Father   . Arrhythmia Father        Status post pacemaker insertion  . Hypertension Mother   . Diabetes Mother     Social History Social History   Tobacco Use  . Smoking status: Never Smoker  . Smokeless tobacco: Never Used  Substance Use Topics  . Alcohol use: Not Currently    Alcohol/week: 0.0 standard drinks    Comment: Occasional social  . Drug use: No   Allergies Tramadol and Acetaminophen  Review of Systems Review of Systems All other systems are reviewed and are negative for acute change except as noted in the  HPI  Physical Exam Vital Signs  I have reviewed the triage vital signs BP (!) 133/96 (BP Location: Left Arm)   Pulse 69   Temp 97.6 F (36.4 C) (Oral)   Resp 18   SpO2 100%   Physical Exam Vitals reviewed. Exam conducted with a chaperone present.  Constitutional:      General: She is not in acute distress.    Appearance: She is well-developed. She is not diaphoretic.  HENT:     Head: Normocephalic and atraumatic.     Right Ear: External ear normal.     Left Ear: External ear normal.     Nose: Nose normal.  Eyes:     General: No scleral icterus.    Conjunctiva/sclera: Conjunctivae normal.  Neck:     Trachea: Phonation normal.  Cardiovascular:     Rate and Rhythm: Normal rate and regular rhythm.  Pulmonary:     Effort: Pulmonary effort is normal. No respiratory distress.     Breath sounds: No stridor.  Abdominal:     General: There is no distension.     Tenderness: There is abdominal tenderness (mild discomfort) in the suprapubic area and left lower quadrant.  Genitourinary:    Rectum: Tenderness and internal hemorrhoid (palpable) present. No external hemorrhoid.     Comments: Limited due to discomfort. Was not able to feel stool in rectum. Musculoskeletal:        General: Normal range of motion.     Cervical back: Normal range of motion.  Neurological:     Mental Status: She is alert and oriented to person, place, and time.  Psychiatric:        Behavior: Behavior normal.     ED Results and Treatments Labs (all labs ordered are listed, but only abnormal results are displayed) Labs Reviewed  URINALYSIS, ROUTINE W REFLEX MICROSCOPIC - Abnormal; Notable for the following components:      Result Value   APPearance HAZY (*)    All other components within normal limits  LIPASE, BLOOD  COMPREHENSIVE METABOLIC PANEL  CBC  I-STAT BETA HCG BLOOD, ED ([facility], [facility], [facility] ONLY)  CBG MONITORING, ED  CBG MONITORING, ED                                                                                                                            EKG  EKG Interpretation  Date/Time:    Ventricular Rate:    PR Interval:    QRS Duration:   QT Interval:    QTC Calculation:   R Axis:     Text Interpretation:        Radiology DG Abd 1 View  Result Date: 11/07/2019 CLINICAL DATA:  Abdominal pain with hematuria EXAM: ABDOMEN - 1 VIEW COMPARISON:  None. FINDINGS: Scattered large and small bowel gas is noted. Retained fecal material is noted consistent with a mild degree of constipation. No definitive renal or ureteral stones are seen. Phleboliths are noted in the pelvis. No bony abnormality is seen. IMPRESSION: Mild constipation.  No other focal abnormality is noted. Electronically Signed   By: Mark  Lukens M.D.   On: 11/07/2019 01:54    Pertinent labs & imaging results that were available during my care of the patient were reviewed by me and considered in my medical decision making (see chart for details).  Medications Ordered in ED Medications  sodium chloride flush (NS) 0.9 % injection 3 mL (3 mLs Intravenous Not Given 11/07/19 0129)  ketorolac (TORADOL) injection 30 mg (30 mg Intramuscular Given 11/07/19 0153)                                                                                                                                    Procedures Procedures  (including critical care time)  Medical Decision Making / ED Course I have reviewed the nursing notes for this encounter and the patient's prior records (if available in EHR or on provided paperwork).   Jennifer Crawford was evaluated in Emergency Department on 11/07/2019 for the symptoms described in the history of present illness. She was evaluated in the context of the global COVID-19 pandemic, which necessitated consideration that the patient might be at risk for infection with the SARS-CoV-2 virus that causes COVID-19. Institutional protocols and algorithms that pertain to the evaluation of patients at risk  for COVID-19 are in a state of rapid change based on information released by regulatory bodies including the CDC and federal and state organizations. These policies and algorithms were followed during the patient's care in the ED.  Patient presents with lower abdominal cramping and tenesmus.  Labs reassuring without leukocytosis.  No significant electrolyte derangements or renal sufficiency.  UA without evidence of infection.  KUB obtained and consistent with constipation.  This fits with patient's presentation.  Recommend stool softener.  Low suspicion for serious intra-abdominal Fama to assess infectious process or bowel obstruction.  The patient appears reasonably screened and/or stabilized for discharge and I doubt any other medical condition or other EMC requiring further screening, evaluation, or treatment in the ED at this time prior to discharge.  The patient is safe for discharge with strict return precautions.       Final Clinical Impression(s) / ED Diagnoses Final diagnoses:  Abdominal pain  Tenesmus (rectal)  Other constipation       The patient appears reasonably screened and/or stabilized for discharge and I doubt any other medical condition or other EMC requiring further screening, evaluation, or treatment in the ED at this time prior to discharge.  Disposition: Discharge  Condition: Good  I have discussed the results, Dx and Tx plan with the patient who expressed understanding and agree(s) with the plan. Discharge instructions discussed at great length. The patient was given strict return precautions who verbalized understanding of the instructions. No further questions at time of discharge.    ED Discharge Orders         Ordered    polyethylene glycol powder (MIRALAX) 17 GM/SCOOP powder     11/07/19 0202          Follow Up: Stroud, Natalie M, FNP [address] [place] [address] [phone]  Schedule an appointment as soon as possible for a visit  As  needed     This chart was dictated using voice recognition software.  Despite best efforts to proofread,  errors can occur which can change the documentation meaning.   ,  Eduardo, MD 11/07/19 0202  

## 2019-11-07 NOTE — ED Notes (Signed)
Patient verbalizes understanding of discharge instructions. Opportunity for questioning and answers were provided.All questions answered completely.  Armband removed by staff, pt discharged from ED. Ambulatory with strong, steady gait 

## 2019-11-07 NOTE — ED Notes (Signed)
Pt wheeled to ED Rm 27 from WR. Pt is A&Ox4, in NAD. Breathing easy, non-labored. Equal rise and fall of chest noted. Speaking in full sentences. Pt c/ abdominal cramping and rectal pain. Pt endorses bright pink blood when wiping. Denies urinary pain. Reports pressure when trying to have BM. Last BM was on Thursday.

## 2019-11-07 NOTE — ED Notes (Signed)
Pt requested her blood sugar checked again. Pt CBG 78 pt given 2 containers of OJ

## 2019-11-07 NOTE — ED Notes (Signed)
Pt taken to xray in NAD 

## 2019-11-10 ENCOUNTER — Telehealth: Payer: Self-pay | Admitting: Family Medicine

## 2019-11-10 NOTE — Telephone Encounter (Signed)
Called patient to schedule 1 wk HFU. Call went to voicemail. Voicemail box full. Unable to leave a message.

## 2019-11-12 ENCOUNTER — Telehealth: Payer: Self-pay | Admitting: Family Medicine

## 2019-11-12 NOTE — Telephone Encounter (Signed)
Called pt back for 1 wk HFU. Call went to voicemail. Voicemail box full. Unable to leave message.

## 2019-12-14 ENCOUNTER — Other Ambulatory Visit: Payer: Self-pay

## 2019-12-14 ENCOUNTER — Encounter (HOSPITAL_COMMUNITY): Payer: Self-pay

## 2019-12-14 ENCOUNTER — Ambulatory Visit (HOSPITAL_COMMUNITY)
Admission: EM | Admit: 2019-12-14 | Discharge: 2019-12-14 | Disposition: A | Discharge status: Home / Self Care | Payer: Medicaid Other | Attending: Family Medicine | Admitting: Family Medicine

## 2019-12-14 DIAGNOSIS — T50Z95A Adverse effect of other vaccines and biological substances, initial encounter: Secondary | ICD-10-CM

## 2019-12-14 LAB — POCT URINALYSIS DIP (DEVICE)
Bilirubin Urine: NEGATIVE
Glucose, UA: 100 mg/dL — AB
Leukocytes,Ua: NEGATIVE
Nitrite: NEGATIVE
Protein, ur: NEGATIVE mg/dL
Specific Gravity, Urine: 1.025 (ref 1.005–1.030)
Urobilinogen, UA: >=8 mg/dL (ref 0.0–1.0)
pH: 6.5 (ref 5.0–8.0)

## 2019-12-14 MED ORDER — HYDROXYZINE HCL 25 MG PO TABS: 25.0000 mg | ORAL_TABLET | Freq: Four times a day (QID) | ORAL | 0 refills | Status: DC

## 2019-12-14 NOTE — ED Triage Notes (Signed)
Pt state she has took her second covid vaccine and she has aches and pain. X 2 days

## 2019-12-14 NOTE — Discharge Instructions (Addendum)
The medicine has been sent to the pharmacy electronically to help you get through the vaccine effects.

## 2019-12-14 NOTE — ED Provider Notes (Signed)
[place]    CSN: [id number] Arrival date & time: 12/14/19  1735      History   Chief Complaint Chief Complaint  Patient presents with  . second covid vaccine    HPI Jennifer Crawford is a 44 y.o. female.   44 yo woman, established [facility] patient, presents with chills and myalgia following Covid vaccine.  (Multiple opioid prescriptions recorded on PDMP over past 6 months.)  She had her second vaccine on Friday (doesn't know which it was).  She has a h/o "kidney problems" and wants to make sure this is not a UTI.    Patient has a h/o seizure disorder and anxiety.  She's concerned that the symptoms could set off a seizure.     Past Medical History:  Diagnosis Date  . Anxiety and depression   . Diabetes mellitus without complication ([place])   . Right flank pain 05/2019  . Right upper quadrant pain 8/22020  . Seizures ([place]) 2015  . Vitamin D deficiency 05/2019    Patient Active Problem List   Diagnosis Date Noted    06/27/2019  . Hematuria 06/27/2019  . Low back pain with sciatica 05/31/2019    05/31/2019  . Intractable headache 05/13/2019    05/13/2019    05/13/2019  . Diabetes mellitus type 2 in nonobese ([place]) 06/20/2015  . Elevated blood alcohol level 06/20/2015  . Anxiety and depression 06/20/2015  . Seizure disorder ([place]) 06/19/2015    Past Surgical History:  Procedure Laterality Date  . TUBAL LIGATION      OB History   No obstetric history on file.      Home Medications    Prior to Admission medications   Medication Sig Start Date End Date Taking? Authorizing Provider  albuterol (PROVENTIL) (2.5 MG/3ML) 0.083% nebulizer solution Take 2.5 mg by nebulization every 6 (six) hours as needed for wheezing or shortness of breath.    [provider]  albuterol (VENTOLIN HFA) 108 (90 Base) MCG/ACT inhaler Inhale 2 puffs into the lungs every 6 (six) hours as needed for wheezing or shortness of breath.    [provider]    blood glucose meter kit and supplies Dispense based on patient and insurance preference. Use up to four times daily as directed. (FOR ICD-10 E10.9, E11.9). 05/13/19   Azzie Glatter, FNP  hydrOXYzine (ATARAX/VISTARIL) 25 MG tablet Take 1 tablet (25 mg total) by mouth every 6 (six) hours. 12/14/19   Robyn Haber, MD  levETIRAcetam (KEPPRA) 1000 MG tablet Take 1 tablet (1,000 mg total) by mouth 2 (two) times daily. 05/13/19   Azzie Glatter, FNP  ondansetron (ZOFRAN ODT) 8 MG disintegrating tablet 1/2- 1 tablet q 8 hr prn nausea, vomiting 09/24/19   Melynda Ripple, MD  polyethylene glycol powder (MIRALAX) 17 GM/SCOOP powder Start taking 1 capful 3 times a day. Slowly cut back as needed until you have normal bowel movements. 11/07/19   CardamaGrayce Sessions, MD  Vitamin D, Ergocalciferol, (DRISDOL) 1.25 MG (50000 UT) CAPS capsule Take 1 capsule (50,000 Units total) by mouth every 7 (seven) days. Patient taking differently: Take 50,000 Units by mouth every Monday.  05/15/19   Azzie Glatter, FNP    Family History Family History  Problem Relation Age of Onset  . Seizures Daughter   . Asthma Son   . Hypertension Father   . Arrhythmia Father        Status post pacemaker insertion  . Hypertension Mother   . Diabetes Mother  Social History Social History   Tobacco Use  . Smoking status: Never Smoker  . Smokeless tobacco: Never Used  Substance Use Topics  . Alcohol use: Not Currently    Alcohol/week: 0.0 standard drinks    Comment: Occasional social  . Drug use: No     Allergies   Tramadol and Acetaminophen   Review of Systems Review of Systems  Constitutional: Positive for activity change, chills, diaphoresis and fever.  Musculoskeletal: Positive for myalgias.  Neurological: Positive for headaches.  All other systems reviewed and are negative.    Physical Exam Triage Vital Signs ED Triage Vitals  Enc Vitals Group     BP      Pulse      Resp      Temp       Temp src      SpO2      Weight      Height      Head Circumference      Peak Flow      Pain Score      Pain Loc      Pain Edu?      Excl. in [place]?    No data found.  Updated Vital Signs BP 131/90 (BP Location: Right Arm)   Pulse 73   Temp 98.6 F (37 C) (Oral)   Resp 16   Wt 74.8 kg   SpO2 100%   BMI 21.77 kg/m    Physical Exam Vitals and nursing note reviewed.  Constitutional:      General: She is in acute distress.     Appearance: She is normal weight. She is not ill-appearing, toxic-appearing or diaphoretic.  HENT:     Head: Normocephalic.     Nose: Rhinorrhea present.  Eyes:     Comments: Patient crying, eyes somewhat injected.  Cardiovascular:     Rate and Rhythm: Normal rate.  Pulmonary:     Effort: Pulmonary effort is normal.  Musculoskeletal:        General: Normal range of motion.     Cervical back: Normal range of motion.  Skin:    General: Skin is warm and dry.  Neurological:     Mental Status: She is alert.  Psychiatric:     Comments: Anxious and tearful      UC Treatments / Results  Labs (all labs ordered are listed, but only abnormal results are displayed) U/a negative   Medications - No data to display  Initial Impression / Assessment and Plan / UC Course  I have reviewed the triage vital signs and the nursing notes.  Pertinent labs & imaging results that were available during my care of the patient were reviewed by me and considered in my medical decision making (see chart for details).    Final Clinical Impressions(s) / UC Diagnoses   Final diagnoses:  Adverse effect of vaccine, initial encounter     Discharge Instructions     The medicine has been sent to the pharmacy electronically to help you get through the vaccine effects.    ED Prescriptions    Medication Sig Dispense Auth. Provider   hydrOXYzine (ATARAX/VISTARIL) 25 MG tablet Take 1 tablet (25 mg total) by mouth every 6 (six) hours. 12 tablet Robyn Haber, MD      I have reviewed the PDMP during this encounter.   Robyn Haber, MD 12/14/19 1918

## 2019-12-27 ENCOUNTER — Encounter (HOSPITAL_COMMUNITY): Payer: Self-pay | Admitting: Emergency Medicine

## 2019-12-27 ENCOUNTER — Emergency Department (HOSPITAL_COMMUNITY): Payer: Medicaid Other

## 2019-12-27 ENCOUNTER — Emergency Department (HOSPITAL_COMMUNITY)
Admission: EM | Admit: 2019-12-27 | Discharge: 2019-12-27 | Disposition: A | Discharge status: Home / Self Care | Payer: Medicaid Other | Attending: Emergency Medicine | Admitting: Emergency Medicine

## 2019-12-27 ENCOUNTER — Other Ambulatory Visit: Payer: Self-pay

## 2019-12-27 DIAGNOSIS — F10929 Alcohol use, unspecified with intoxication, unspecified: Secondary | ICD-10-CM | POA: Diagnosis not present

## 2019-12-27 DIAGNOSIS — Y906 Blood alcohol level of 120-199 mg/100 ml: Secondary | ICD-10-CM | POA: Diagnosis not present

## 2019-12-27 DIAGNOSIS — E119 Type 2 diabetes mellitus without complications: Secondary | ICD-10-CM | POA: Insufficient documentation

## 2019-12-27 DIAGNOSIS — R569 Unspecified convulsions: Secondary | ICD-10-CM | POA: Insufficient documentation

## 2019-12-27 LAB — CBC WITH DIFFERENTIAL/PLATELET
Abs Immature Granulocytes: 0.01 10*3/uL (ref 0.00–0.07)
Basophils Absolute: 0 10*3/uL (ref 0.0–0.1)
Basophils Relative: 1 %
Eosinophils Absolute: 0.1 10*3/uL (ref 0.0–0.5)
Eosinophils Relative: 2 %
HCT: 40.8 % (ref 36.0–46.0)
Hemoglobin: 13.2 g/dL (ref 12.0–15.0)
Immature Granulocytes: 0 %
Lymphocytes Relative: 38 %
Lymphs Abs: 2.2 10*3/uL (ref 0.7–4.0)
MCH: 31.1 pg (ref 26.0–34.0)
MCHC: 32.4 g/dL (ref 30.0–36.0)
MCV: 96.2 fL (ref 80.0–100.0)
Monocytes Absolute: 0.4 10*3/uL (ref 0.1–1.0)
Monocytes Relative: 6 %
Neutro Abs: 3.1 10*3/uL (ref 1.7–7.7)
Neutrophils Relative %: 53 %
Platelets: 211 10*3/uL (ref 150–400)
RBC: 4.24 MIL/uL (ref 3.87–5.11)
RDW: 12.8 % (ref 11.5–15.5)
WBC: 5.8 10*3/uL (ref 4.0–10.5)
nRBC: 0 % (ref 0.0–0.2)

## 2019-12-27 LAB — ETHANOL: Alcohol, Ethyl (B): 135 mg/dL — ABNORMAL HIGH (ref <10)

## 2019-12-27 LAB — BASIC METABOLIC PANEL
Anion gap: 11 (ref 5–15)
BUN: 12 mg/dL (ref 6–20)
CO2: 19 mmol/L — ABNORMAL LOW (ref 22–32)
Calcium: 8.6 mg/dL — ABNORMAL LOW (ref 8.9–10.3)
Chloride: 105 mmol/L (ref 98–111)
Creatinine, Ser: 0.89 mg/dL (ref 0.44–1.00)
GFR calc Af Amer: >60 mL/min (ref >60)
GFR calc non Af Amer: >60 mL/min (ref >60)
Glucose, Bld: 94 mg/dL (ref 70–99)
Potassium: 4.2 mmol/L (ref 3.5–5.1)
Sodium: 135 mmol/L (ref 135–145)

## 2019-12-27 LAB — TROPONIN I (HIGH SENSITIVITY): Troponin I (High Sensitivity): <2 ng/L (ref <18)

## 2019-12-27 MED ORDER — LACTATED RINGERS IV BOLUS
1000.0000 mL | Freq: Once | INTRAVENOUS | Status: AC
  Administered 2019-12-27: 07:00:00 1000 mL via INTRAVENOUS

## 2019-12-27 MED ORDER — LORAZEPAM 2 MG/ML IJ SOLN
1.0000 mg | Freq: Once | INTRAMUSCULAR | Status: AC
  Administered 2019-12-27: 07:00:00 1 mg via INTRAVENOUS
  Filled 2019-12-27: qty 1

## 2019-12-27 MED ORDER — LEVETIRACETAM IN NACL 1000 MG/100ML IV SOLN
1000.0000 mg | Freq: Once | INTRAVENOUS | Status: AC
  Administered 2019-12-27: 06:00:00 1000 mg via INTRAVENOUS
  Filled 2019-12-27: qty 100

## 2019-12-27 MED ORDER — IBUPROFEN 200 MG PO TABS
400.0000 mg | ORAL_TABLET | Freq: Once | ORAL | Status: AC
  Administered 2019-12-27: 400 mg via ORAL
  Filled 2019-12-27: qty 2

## 2019-12-27 MED ORDER — LEVETIRACETAM 500 MG PO TABS: 1000.0000 mg | ORAL_TABLET | Freq: Two times a day (BID) | ORAL | 0 refills | Status: DC

## 2019-12-27 NOTE — Discharge Instructions (Addendum)
Please be aware you may have another seizure  Do not drive until seen by your physician for your condition  Do not climb ladders/roofs/trees as a seizure can occur at that height and cause serious harm  Do not bathe/swim alone as a seizure can occur and cause serious harm  Avoid alcohol use.  Please followup with your physician or neurologist for further testing and possible treatment

## 2019-12-27 NOTE — ED Provider Notes (Signed)
Pt initially seen by Dr Bebe Shaggy.  Please see his note.  8:09 AM Labs reviewed.  ETOH elevated.  Pt is currently sleeping.  No seizure activity.  Vitals stable.  Will continue to monitor.  10:58 AM Pt awake.  States she does not have her keppra.  She is complaining of a headache.  No further seizure activity.   Will give tylenol, refill keppra.   Ready for discharge.  Counseled pt on alcohol use as it could be contributing to her seizures.   Linwood Dibbles, MD 12/27/19 1101

## 2019-12-27 NOTE — ED Provider Notes (Signed)
[facility] DEPT Provider Note   CSN: [id number] Arrival date & time: 12/27/19  0533     History Chief Complaint  Patient presents with  . Seizures   Level 5 caveat due to altered mental status Jennifer Crawford is a 44 y.o. female.  The history is provided by the EMS personnel. The history is limited by the condition of the patient.  Seizures Patient presents from home via EMS.  It is reported the patient had a seizure at home.  The history was provided by EMS. It is reported the patient told family member that she did not feel good and was started having a seizure.  The seizure was on and off but terminated spontaneously Vitals have been stable, but has not been verbal with EMS.  No other details are known  no reported trauma    Past Medical History:  Diagnosis Date  . Anxiety and depression   . Diabetes mellitus without complication ([place])   . Right flank pain 05/2019  . Right upper quadrant pain 8/22020  . Seizures ([place]) 2015  . Vitamin D deficiency 05/2019    Patient Active Problem List   Diagnosis Date Noted  . Hypoglycemia 06/27/2019  . Hematuria 06/27/2019  . Low back pain with sciatica 05/31/2019  . Left flank pain 05/31/2019  . Intractable headache 05/13/2019  . Right upper quadrant pain 05/13/2019  . Right flank pain 05/13/2019  . Diabetes mellitus type 2 in nonobese ([place]) 06/20/2015  . Elevated blood alcohol level 06/20/2015  . Anxiety and depression 06/20/2015  . Seizure disorder ([place]) 06/19/2015    Past Surgical History:  Procedure Laterality Date  . TUBAL LIGATION       OB History   No obstetric history on file.     Family History  Problem Relation Age of Onset  . Seizures Daughter   . Asthma Son   . Hypertension Father   . Arrhythmia Father        Status post pacemaker insertion  . Hypertension Mother   . Diabetes Mother     Social History   Tobacco Use  . Smoking status: Never Smoker  .  Smokeless tobacco: Never Used  Substance Use Topics  . Alcohol use: Not Currently    Alcohol/week: 0.0 standard drinks    Comment: Occasional social  . Drug use: No    Home Medications Prior to Admission medications   Medication Sig Start Date End Date Taking? Authorizing Provider  albuterol (PROVENTIL) (2.5 MG/3ML) 0.083% nebulizer solution Take 2.5 mg by nebulization every 6 (six) hours as needed for wheezing or shortness of breath.    [provider]  albuterol (VENTOLIN HFA) 108 (90 Base) MCG/ACT inhaler Inhale 2 puffs into the lungs every 6 (six) hours as needed for wheezing or shortness of breath.    [provider]  blood glucose meter kit and supplies Dispense based on patient and insurance preference. Use up to four times daily as directed. (FOR ICD-10 E10.9, E11.9). 05/13/19   Azzie Glatter, FNP  hydrOXYzine (ATARAX/VISTARIL) 25 MG tablet Take 1 tablet (25 mg total) by mouth every 6 (six) hours. 12/14/19   Robyn Haber, MD  levETIRAcetam (KEPPRA) 1000 MG tablet Take 1 tablet (1,000 mg total) by mouth 2 (two) times daily. 05/13/19   Azzie Glatter, FNP  ondansetron (ZOFRAN ODT) 8 MG disintegrating tablet 1/2- 1 tablet q 8 hr prn nausea, vomiting 09/24/19   Melynda Ripple, MD  polyethylene glycol powder (MIRALAX) 17  GM/SCOOP powder Start taking 1 capful 3 times a day. Slowly cut back as needed until you have normal bowel movements. 11/07/19   CardamaGrayce Sessions, MD  Vitamin D, Ergocalciferol, (DRISDOL) 1.25 MG (50000 UT) CAPS capsule Take 1 capsule (50,000 Units total) by mouth every 7 (seven) days. Patient taking differently: Take 50,000 Units by mouth every Monday.  05/15/19   Azzie Glatter, FNP    Allergies    Tramadol and Acetaminophen  Review of Systems   Review of Systems  Unable to perform ROS: Mental status change  Neurological: Positive for seizures.    Physical Exam Updated Vital Signs BP (!) 155/98 (BP Location: Right Arm)   Pulse  98   Temp 97.8 F (36.6 C) (Oral)   Resp (!) 29   SpO2 100%   Physical Exam CONSTITUTIONAL: Well developed/well nourished HEAD: Normocephalic/atraumatic EYES: EOMI/PERRL, no nystagmus ENMT: Mucous membranes moist, no obvious trauma NECK: supple no meningeal signs CV: S1/S2 noted, no murmurs/rubs/gallops noted LUNGS: Lungs are clear to auscultation bilaterally, no apparent distress ABDOMEN: soft, nondistended NEURO: Pt is awake/alert.  She is nonverbal and does not follow commands.   EXTREMITIES: pulses normal/equal, full ROM, no deformities SKIN: warm, color normal PSYCH: Unable to assess  ED Results / Procedures / Treatments   Labs (all labs ordered are listed, but only abnormal results are displayed) Labs Reviewed  CBC WITH DIFFERENTIAL/PLATELET  BASIC METABOLIC PANEL  ETHANOL  CBG MONITORING, ED  TROPONIN I (HIGH SENSITIVITY)    EKG EKG Interpretation  Date/Time:  Saturday December 27 2019 05:58:53 EDT Ventricular Rate:  94 PR Interval:    QRS Duration: 83 QT Interval:  352 QTC Calculation: 441 R Axis:   75 Text Interpretation: Sinus rhythm Borderline prolonged PR interval Biatrial enlargement Probable LVH with secondary repol abnrm T wave abnormality Confirmed by Ripley Fraise [phone]) on 12/27/2019 6:04:43 AM   Radiology No results found.  Procedures Procedures    Medications Ordered in ED Medications  levETIRAcetam (KEPPRA) IVPB 1000 mg/100 mL premix (0 mg Intravenous Stopped 12/27/19 0643)  LORazepam (ATIVAN) injection 1 mg (1 mg Intravenous Given 12/27/19 5631)    ED Course  I have reviewed the triage vital signs and the nursing notes.  Pertinent labs  results that were available during my care of the patient were reviewed by me and considered in my medical decision making (see chart for details).    MDM Rules/Calculators/A&P                      5:58 AM Patient with history of seizures presents with reported seizure activity at home.  There is no  family available to corroborate history.  Phone numbers listed in the chart for family did not work When I was in the room patient began having spontaneous convulsions the terminated spontaneously.  This lasted approximately 5 seconds. It is noted the patient is on Keppra 1000 mg.  We will give this IV.  Will check labs and monitor. 6:23 AM Discussed with son at [phone] He reports patient has been doing well recently.  He does not recall any recent seizures.  Tonight she came into his room and was reporting chest pain and she began having body jerking He had no other details. 6:54 AM Spoke to daughter via phone.  She reports that patient has not been on anti-epileptic therapy recently Patient still appears confused.  She is attempting to crawl out of bed and had to be assisted back and  lie down.  Labs are pending this time.  This could be part of her postictal confusion 6:57 AM Signed out to dr jon knapp to f/u on labs/monitor patient If patient is awake/alert, ambulatory she will be safe for d/c home Final Clinical Impression(s) / ED Diagnoses Final diagnoses:  Seizure [facility])    Rx / DC Orders ED Discharge Orders    None       Ripley Fraise, MD 12/27/19 [phone]

## 2019-12-27 NOTE — ED Triage Notes (Signed)
Pt to ED with c/o of seizures. Pt son called [facility] with complaint that patient and him were laying in bed an hour ago and patient started to have a seizure after she told him she didn't feel good. Pt son stated to [facility] that the seizure was on and off. Pt has not responded verbally to [facility] but vitals are stable and follows commands. Pupils are reactive and equal.

## 2019-12-27 NOTE — ED Notes (Signed)
Patient attempting to get out of bed. Bed alarm placed on bed for safety

## 2019-12-27 NOTE — ED Notes (Signed)
An After Visit Summary was printed and given to the patient. Discharge instructions given and no further questions at this time. Pt stated she will take taxi home.

## 2019-12-29 ENCOUNTER — Encounter: Payer: Self-pay | Admitting: Neurology

## 2019-12-29 LAB — CBG MONITORING, ED: Glucose-Capillary: 84 mg/dL (ref 70–99)

## 2020-01-30 ENCOUNTER — Encounter (HOSPITAL_COMMUNITY): Payer: Self-pay | Admitting: Emergency Medicine

## 2020-01-30 ENCOUNTER — Emergency Department (HOSPITAL_COMMUNITY)
Admission: EM | Admit: 2020-01-30 | Discharge: 2020-01-31 | Disposition: A | Discharge status: Home / Self Care | Payer: Medicaid Other | Attending: Emergency Medicine | Admitting: Emergency Medicine

## 2020-01-30 ENCOUNTER — Other Ambulatory Visit: Payer: Self-pay

## 2020-01-30 ENCOUNTER — Emergency Department (HOSPITAL_COMMUNITY): Payer: Medicaid Other

## 2020-01-30 DIAGNOSIS — E119 Type 2 diabetes mellitus without complications: Secondary | ICD-10-CM | POA: Insufficient documentation

## 2020-01-30 DIAGNOSIS — Z79899 Other long term (current) drug therapy: Secondary | ICD-10-CM | POA: Diagnosis not present

## 2020-01-30 DIAGNOSIS — R1031 Right lower quadrant pain: Secondary | ICD-10-CM | POA: Diagnosis present

## 2020-01-30 DIAGNOSIS — N3000 Acute cystitis without hematuria: Secondary | ICD-10-CM | POA: Diagnosis not present

## 2020-01-30 LAB — URINALYSIS, ROUTINE W REFLEX MICROSCOPIC
Bilirubin Urine: NEGATIVE
Glucose, UA: NEGATIVE mg/dL
Ketones, ur: NEGATIVE mg/dL
Nitrite: NEGATIVE
Protein, ur: NEGATIVE mg/dL
Specific Gravity, Urine: 1.015 (ref 1.005–1.030)
pH: 6 (ref 5.0–8.0)

## 2020-01-30 LAB — COMPREHENSIVE METABOLIC PANEL WITH GFR
ALT: 24 U/L (ref 0–44)
AST: 26 U/L (ref 15–41)
Albumin: 4.2 g/dL (ref 3.5–5.0)
Alkaline Phosphatase: 54 U/L (ref 38–126)
Anion gap: 8 (ref 5–15)
BUN: 12 mg/dL (ref 6–20)
CO2: 25 mmol/L (ref 22–32)
Calcium: 9.3 mg/dL (ref 8.9–10.3)
Chloride: 103 mmol/L (ref 98–111)
Creatinine, Ser: 0.95 mg/dL (ref 0.44–1.00)
GFR calc Af Amer: >60 mL/min
GFR calc non Af Amer: >60 mL/min
Glucose, Bld: 95 mg/dL (ref 70–99)
Potassium: 3.7 mmol/L (ref 3.5–5.1)
Sodium: 136 mmol/L (ref 135–145)
Total Bilirubin: 0.8 mg/dL (ref 0.3–1.2)
Total Protein: 7.5 g/dL (ref 6.5–8.1)

## 2020-01-30 LAB — CBC
HCT: 37.3 % (ref 36.0–46.0)
Hemoglobin: 12.2 g/dL (ref 12.0–15.0)
MCH: 31 pg (ref 26.0–34.0)
MCHC: 32.7 g/dL (ref 30.0–36.0)
MCV: 94.7 fL (ref 80.0–100.0)
Platelets: 200 10*3/uL (ref 150–400)
RBC: 3.94 MIL/uL (ref 3.87–5.11)
RDW: 13 % (ref 11.5–15.5)
WBC: 6 10*3/uL (ref 4.0–10.5)
nRBC: 0 % (ref 0.0–0.2)

## 2020-01-30 LAB — I-STAT BETA HCG BLOOD, ED (MC, WL, AP ONLY): I-stat hCG, quantitative: <5 m[IU]/mL (ref <5)

## 2020-01-30 LAB — LIPASE, BLOOD: Lipase: 29 U/L (ref 11–51)

## 2020-01-30 LAB — CBG MONITORING, ED: Glucose-Capillary: 87 mg/dL (ref 70–99)

## 2020-01-30 MED ORDER — HYDROCODONE-ACETAMINOPHEN 5-325 MG PO TABS: 1.0000 | ORAL_TABLET | Freq: Four times a day (QID) | ORAL | 0 refills | Status: DC | PRN

## 2020-01-30 MED ORDER — SODIUM CHLORIDE 0.9% FLUSH
3.0000 mL | Freq: Once | INTRAVENOUS | Status: AC
  Administered 2020-01-30: 3 mL via INTRAVENOUS

## 2020-01-30 MED ORDER — ONDANSETRON HCL 4 MG/2ML IJ SOLN
4.0000 mg | Freq: Once | INTRAMUSCULAR | Status: AC
  Administered 2020-01-30: 22:00:00 4 mg via INTRAVENOUS
  Filled 2020-01-30: qty 2

## 2020-01-30 MED ORDER — MORPHINE SULFATE (PF) 4 MG/ML IV SOLN
4.0000 mg | Freq: Once | INTRAVENOUS | Status: AC
  Administered 2020-01-30: 22:00:00 4 mg via INTRAVENOUS
  Filled 2020-01-30: qty 1

## 2020-01-30 MED ORDER — CEPHALEXIN 250 MG PO CAPS
500.0000 mg | ORAL_CAPSULE | Freq: Once | ORAL | Status: AC
  Administered 2020-01-31: 500 mg via ORAL
  Filled 2020-01-30: qty 2

## 2020-01-30 MED ORDER — CEPHALEXIN 500 MG PO CAPS: 500.0000 mg | ORAL_CAPSULE | Freq: Three times a day (TID) | ORAL | 0 refills | Status: DC

## 2020-01-30 MED ORDER — MORPHINE SULFATE (PF) 4 MG/ML IV SOLN
4.0000 mg | Freq: Once | INTRAVENOUS | Status: AC
  Administered 2020-01-31: 4 mg via INTRAVENOUS
  Filled 2020-01-30: qty 1

## 2020-01-30 MED ORDER — SODIUM CHLORIDE 0.9 % IV BOLUS
1000.0000 mL | Freq: Once | INTRAVENOUS | Status: AC
  Administered 2020-01-30: 1000 mL via INTRAVENOUS

## 2020-01-30 MED ORDER — KETOROLAC TROMETHAMINE 30 MG/ML IJ SOLN
30.0000 mg | Freq: Once | INTRAMUSCULAR | Status: AC
  Administered 2020-01-30: 22:00:00 30 mg via INTRAVENOUS
  Filled 2020-01-30: qty 1

## 2020-01-30 NOTE — ED Triage Notes (Signed)
Patient reports RLQ abdominal pain with emesis , diarrhea , headache , low back pain and fatigue onset this week . Denies fever or chills .

## 2020-01-30 NOTE — ED Notes (Signed)
Pt called out requesting to speak to her nurse, stating that she was in a significant pain. Hinda Kehr, RN aware of pts sentiments.

## 2020-01-30 NOTE — ED Provider Notes (Signed)
[facility] EMERGENCY DEPARTMENT Provider Note   CSN: [id number] Arrival date & time: 01/30/20  2032     History Chief Complaint  Patient presents with  . Abdominal Pain    Jennifer Crawford is a 44 y.o. female.  This patient is a 44 year old female with past medical history of diabetes, seizures, anxiety.  Presents today with a several day history of pain to the right flank, right upper quadrant, and right abdomen.  She reports episodes of urinary frequency as well as vomiting and occasional loose stool.  All has been nonbloody and nonbilious.  She denies fevers or chills.  Patient status post hysterectomy in the past.  The history is provided by the patient.  Abdominal Pain Pain location:  RUQ, RLQ and R flank Pain quality: cramping   Pain radiates to:  Does not radiate Pain severity:  Moderate Onset quality:  Gradual Duration:  3 days Timing:  Constant Progression:  Worsening Chronicity:  New Relieved by:  Nothing Worsened by:  Movement and palpation Ineffective treatments:  None tried Associated symptoms: no dysuria        Past Medical History:  Diagnosis Date  . Anxiety and depression   . Diabetes mellitus without complication ([place])   . Right flank pain 05/2019  . Right upper quadrant pain 8/22020  . Seizures ([place]) 2015  . Vitamin D deficiency 05/2019    Patient Active Problem List   Diagnosis Date Noted  . Hypoglycemia 06/27/2019  . Hematuria 06/27/2019  . Low back pain with sciatica 05/31/2019  . Left flank pain 05/31/2019  . Intractable headache 05/13/2019  . Right upper quadrant pain 05/13/2019  . Right flank pain 05/13/2019  . Diabetes mellitus type 2 in nonobese ([place]) 06/20/2015  . Elevated blood alcohol level 06/20/2015  . Anxiety and depression 06/20/2015  . Seizure disorder ([place]) 06/19/2015    Past Surgical History:  Procedure Laterality Date  . ABDOMINAL HYSTERECTOMY    . TUBAL LIGATION       OB History   No  obstetric history on file.     Family History  Problem Relation Age of Onset  . Seizures Daughter   . Asthma Son   . Hypertension Father   . Arrhythmia Father        Status post pacemaker insertion  . Hypertension Mother   . Diabetes Mother     Social History   Tobacco Use  . Smoking status: Never Smoker  . Smokeless tobacco: Never Used  Substance Use Topics  . Alcohol use: Not Currently    Alcohol/week: 0.0 standard drinks    Comment: Occasional social  . Drug use: No    Home Medications Prior to Admission medications   Medication Sig Start Date End Date Taking? Authorizing Provider  albuterol (PROVENTIL) (2.5 MG/3ML) 0.083% nebulizer solution Take 2.5 mg by nebulization every 6 (six) hours as needed for wheezing or shortness of breath.   Yes [provider]  albuterol (VENTOLIN HFA) 108 (90 Base) MCG/ACT inhaler Inhale 2 puffs into the lungs every 6 (six) hours as needed for wheezing or shortness of breath.   Yes [provider]  hydrOXYzine (ATARAX/VISTARIL) 25 MG tablet Take 1 tablet (25 mg total) by mouth every 6 (six) hours. 12/14/19  Yes Robyn Haber, MD  levETIRAcetam (KEPPRA) 500 MG tablet Take 2 tablets (1,000 mg total) by mouth 2 (two) times daily. 12/27/19  Yes Ripley Fraise, MD  Vitamin D, Ergocalciferol, (DRISDOL) 1.25 MG (50000 UT) CAPS capsule Take  1 capsule (50,000 Units total) by mouth every 7 (seven) days. 05/15/19  Yes Azzie Glatter, FNP  blood glucose meter kit and supplies Dispense based on patient and insurance preference. Use up to four times daily as directed. (FOR ICD-10 E10.9, E11.9). 05/13/19   Azzie Glatter, FNP    Allergies    Tramadol and Acetaminophen  Review of Systems   Review of Systems  Gastrointestinal: Positive for abdominal pain.  Genitourinary: Negative for dysuria.  All other systems reviewed and are negative.   Physical Exam Updated Vital Signs BP (!) 141/91 (BP Location: Left Arm)   Pulse 84    Temp 98.1 F (36.7 C) (Oral)   Resp 16   Ht 6' 1"  (1.854 m)   Wt 80 kg   SpO2 100%   BMI 23.27 kg/m   Physical Exam Vitals and nursing note reviewed.  Constitutional:      General: She is not in acute distress.    Appearance: She is well-developed. She is not diaphoretic.  HENT:     Head: Normocephalic and atraumatic.  Cardiovascular:     Rate and Rhythm: Normal rate and regular rhythm.     Heart sounds: No murmur. No friction rub. No gallop.   Pulmonary:     Effort: Pulmonary effort is normal. No respiratory distress.     Breath sounds: Normal breath sounds. No wheezing.  Abdominal:     General: Bowel sounds are normal. There is no distension.     Palpations: Abdomen is soft.     Tenderness: There is abdominal tenderness in the right upper quadrant and right lower quadrant. There is right CVA tenderness. There is no left CVA tenderness, guarding or rebound.  Musculoskeletal:        General: Normal range of motion.     Cervical back: Normal range of motion and neck supple.  Skin:    General: Skin is warm and dry.  Neurological:     Mental Status: She is alert and oriented to person, place, and time.     ED Results / Procedures / Treatments   Labs (all labs ordered are listed, but only abnormal results are displayed) Labs Reviewed  URINALYSIS, ROUTINE W REFLEX MICROSCOPIC - Abnormal; Notable for the following components:      Result Value   APPearance HAZY (*)    Hgb urine dipstick SMALL (*)    Leukocytes,Ua MODERATE (*)    Bacteria, UA RARE (*)    All other components within normal limits  LIPASE, BLOOD  COMPREHENSIVE METABOLIC PANEL  CBC  CBG MONITORING, ED  I-STAT BETA HCG BLOOD, ED ([facility], [facility], [facility] ONLY)    EKG None  Radiology No results found.  Procedures Procedures (including critical care time)  Medications Ordered in ED Medications  sodium chloride flush (NS) 0.9 % injection 3 mL (has no administration in time range)  sodium chloride 0.9 % bolus  1,000 mL (has no administration in time range)  ketorolac (TORADOL) 30 MG/ML injection 30 mg (has no administration in time range)  morphine 4 MG/ML injection 4 mg (has no administration in time range)  ondansetron (ZOFRAN) injection 4 mg (has no administration in time range)    ED Course  I have reviewed the triage vital signs and the nursing notes.  Pertinent labs & imaging results that were available during my care of the patient were reviewed by me and considered in my medical decision making (see chart for details).    MDM Rules/Calculators/A&P  Patient  presenting here with right-sided abdominal pain, the etiology of which I am uncertain.  Her laboratory studies are reassuring.  She does have the suggestion of a urinary tract infection, but no evidence of appendicitis, cholecystitis, or ovarian cyst.  Patient is status post hysterectomy.  At this point, she has been treated with pain medicine here in the ER and appears to be feeling somewhat better.  She will be given antibiotics for UTI and pain medication.  If not improving, she can follow-up with her primary doctor next week.  Final Clinical Impression(s) / ED Diagnoses Final diagnoses:  None    Rx / DC Orders ED Discharge Orders    None       Veryl Speak, MD 01/30/20 2340

## 2020-01-30 NOTE — Discharge Instructions (Signed)
Begin taking Keflex as prescribed.  Take hydrocodone as prescribed as needed for pain.  Follow-up with your primary doctor if not improving through the weekend.

## 2020-01-30 NOTE — ED Notes (Signed)
Pt transported to CT ?

## 2020-02-10 ENCOUNTER — Emergency Department (HOSPITAL_COMMUNITY)
Admission: EM | Admit: 2020-02-10 | Discharge: 2020-02-10 | Disposition: A | Discharge status: Home / Self Care | Payer: Medicaid Other | Attending: Emergency Medicine | Admitting: Emergency Medicine

## 2020-02-10 ENCOUNTER — Encounter (HOSPITAL_COMMUNITY): Payer: Self-pay | Admitting: Emergency Medicine

## 2020-02-10 DIAGNOSIS — J4541 Moderate persistent asthma with (acute) exacerbation: Secondary | ICD-10-CM

## 2020-02-10 DIAGNOSIS — E119 Type 2 diabetes mellitus without complications: Secondary | ICD-10-CM | POA: Insufficient documentation

## 2020-02-10 DIAGNOSIS — R062 Wheezing: Secondary | ICD-10-CM | POA: Diagnosis present

## 2020-02-10 DIAGNOSIS — R300 Dysuria: Secondary | ICD-10-CM | POA: Insufficient documentation

## 2020-02-10 DIAGNOSIS — Z79899 Other long term (current) drug therapy: Secondary | ICD-10-CM | POA: Insufficient documentation

## 2020-02-10 DIAGNOSIS — R10815 Periumbilic abdominal tenderness: Secondary | ICD-10-CM | POA: Diagnosis not present

## 2020-02-10 LAB — URINALYSIS, ROUTINE W REFLEX MICROSCOPIC
Bilirubin Urine: NEGATIVE
Glucose, UA: NEGATIVE mg/dL
Ketones, ur: NEGATIVE mg/dL
Nitrite: NEGATIVE
Protein, ur: NEGATIVE mg/dL
Specific Gravity, Urine: 1.021 (ref 1.005–1.030)
pH: 5 (ref 5.0–8.0)

## 2020-02-10 LAB — CBG MONITORING, ED: Glucose-Capillary: 72 mg/dL (ref 70–99)

## 2020-02-10 MED ORDER — PULMICORT FLEXHALER 90 MCG/ACT IN AEPB: 1.0000 | INHALATION_SPRAY | Freq: Two times a day (BID) | RESPIRATORY_TRACT | 1 refills | Status: DC

## 2020-02-10 MED ORDER — FOSFOMYCIN TROMETHAMINE 3 G PO PACK
3.0000 g | PACK | Freq: Once | ORAL | Status: AC
  Administered 2020-02-10: 3 g via ORAL
  Filled 2020-02-10: qty 3

## 2020-02-10 MED ORDER — ALBUTEROL SULFATE HFA 108 (90 BASE) MCG/ACT IN AERS: 2.0000 | INHALATION_SPRAY | RESPIRATORY_TRACT | 1 refills | Status: AC | PRN

## 2020-02-10 NOTE — Discharge Instructions (Addendum)
Your urinary tract infection was treated here with a single dose of antibiotic.  You do not need to take any more antibiotic.  If you are continuing to have symptoms of urinary tract infection you will need to follow-up with your primary care physician as soon as possible.  There may be other causes giving you symptoms of UTI.  I have looked back through your records and his only had 1+ urine culture that was in July 2020.  This means that you are not actually growing any bacteria in your urine and so your symptoms may not actually be a urinary tract infection. Secondly you are going to be treated for your asthma exacerbation.  I will refill your albuterol.  I am giving you a steroid Dosepak which can cause your blood sugars to elevate.  Given the fact that your blood sugar today was in the 70s it appears you have fairly excellent control of your blood sugar and I do not have any hesitation about giving you this medication for treatment of your wheezing.  Please make an appointment with your primary care doctor today to follow-up within the week. Get help right away if: You are getting worse and do not respond to treatment during an asthma attack. You are short of breath when at rest or when doing very little physical activity. You have difficulty eating, drinking, or talking. You have chest pain or tightness. You develop a fast heartbeat or palpitations. You have a bluish color to your lips or fingernails. You are light-headed or dizzy, or you faint. Your peak flow reading is less than 50% of your personal best. You feel too tired to breathe normally.

## 2020-02-10 NOTE — ED Notes (Signed)
Pt d/c home per MD order. Discharge summary reviewed- pt verbalizes understanding. Ambulatory off unit. No s/s of acute distress noted.

## 2020-02-10 NOTE — ED Triage Notes (Signed)
Pt arrives to ED from just leaving work hx of bronchitis and out of both albuterol Inhaler and Proventil.  Pt also states she was diagnosed with UTI on 4/30 and has completed Keflex but pt reports dark yellow urine and seems to have frequent urination and urge incontinence.

## 2020-02-10 NOTE — ED Provider Notes (Signed)
[facility] Provider Note   CSN: [id number] Arrival date & time: 02/10/20  0715     History Chief Complaint  Patient presents with  . Asthma  . Urinary Tract Infection    Jennifer Crawford is a 44 y.o. female who  has a past medical history of Anxiety and depression, Diabetes mellitus without complication ([place]), Right flank pain (05/2019), Right upper quadrant pain (8/22020), Seizures ([place]) (2015), and Vitamin D deficiency (05/2019). She ran out of her asthma medication. She has been using it more frequently due to environmental allergies. She c/o wheezing and sob consistent with her previous asthma exacerbations. She was seen 4/30 and diagnosed with urinary tract infection.  She was treated with Keflex and just completed the course of her antibiotics yesterday.  She states that her symptoms have not improved.  She has suprapubic pain radiating to the left flank.  She has dark urine.  She complains of urgency to urinate with incontinence.  Denies vaginal symptoms.  HPI     Past Medical History:  Diagnosis Date  . Anxiety and depression   . Diabetes mellitus without complication ([place])   . Right flank pain 05/2019  . Right upper quadrant pain 8/22020  . Seizures ([place]) 2015  . Vitamin D deficiency 05/2019    Patient Active Problem List   Diagnosis Date Noted  . Hypoglycemia 06/27/2019  . Hematuria 06/27/2019  . Low back pain with sciatica 05/31/2019  . Left flank pain 05/31/2019  . Intractable headache 05/13/2019  . Right upper quadrant pain 05/13/2019  . Right flank pain 05/13/2019  . Diabetes mellitus type 2 in nonobese ([place]) 06/20/2015  . Elevated blood alcohol level 06/20/2015  . Anxiety and depression 06/20/2015  . Seizure disorder ([place]) 06/19/2015    Past Surgical History:  Procedure Laterality Date  . ABDOMINAL HYSTERECTOMY    . TUBAL LIGATION       OB History   No obstetric history on file.     Family History    Problem Relation Age of Onset  . Seizures Daughter   . Asthma Son   . Hypertension Father   . Arrhythmia Father        Status post pacemaker insertion  . Hypertension Mother   . Diabetes Mother     Social History   Tobacco Use  . Smoking status: Never Smoker  . Smokeless tobacco: Never Used  Substance Use Topics  . Alcohol use: Not Currently    Alcohol/week: 0.0 standard drinks    Comment: Occasional social  . Drug use: No    Home Medications Prior to Admission medications   Medication Sig Start Date End Date Taking? Authorizing Provider  albuterol (PROVENTIL) (2.5 MG/3ML) 0.083% nebulizer solution Take 2.5 mg by nebulization every 6 (six) hours as needed for wheezing or shortness of breath.    [provider]  albuterol (VENTOLIN HFA) 108 (90 Base) MCG/ACT inhaler Inhale 2 puffs into the lungs every 6 (six) hours as needed for wheezing or shortness of breath.    [provider]  blood glucose meter kit and supplies Dispense based on patient and insurance preference. Use up to four times daily as directed. (FOR ICD-10 E10.9, E11.9). 05/13/19   Azzie Glatter, FNP  cephALEXin (KEFLEX) 500 MG capsule Take 1 capsule (500 mg total) by mouth 3 (three) times daily. 01/30/20   Veryl Speak, MD  HYDROcodone-acetaminophen (NORCO) 5-325 MG tablet Take 1-2 tablets by mouth every 6 (six) hours as needed.  01/30/20   Veryl Speak, MD  hydrOXYzine (ATARAX/VISTARIL) 25 MG tablet Take 1 tablet (25 mg total) by mouth every 6 (six) hours. 12/14/19   Robyn Haber, MD  levETIRAcetam (KEPPRA) 500 MG tablet Take 2 tablets (1,000 mg total) by mouth 2 (two) times daily. 12/27/19   Ripley Fraise, MD  Vitamin D, Ergocalciferol, (DRISDOL) 1.25 MG (50000 UT) CAPS capsule Take 1 capsule (50,000 Units total) by mouth every 7 (seven) days. 05/15/19   Azzie Glatter, FNP    Allergies    Tramadol and Acetaminophen  Review of Systems   Review of Systems Ten systems reviewed and are  negative for acute change, except as noted in the HPI.   Physical Exam Updated Vital Signs BP (!) 131/96 (BP Location: Left Arm)   Pulse 76   Temp 98.6 F (37 C) (Oral)   Resp 14   Ht 6' 1" (1.854 m)   Wt 74.8 kg   SpO2 100%   BMI 21.77 kg/m   Physical Exam Vitals and nursing note reviewed.  Constitutional:      General: She is not in acute distress.    Appearance: She is well-developed. She is not diaphoretic.  HENT:     Head: Normocephalic and atraumatic.     Comments: Hoarse voice     Mouth/Throat:     Lips: Pink.     Mouth: Mucous membranes are moist.     Pharynx: Oropharynx is clear. Uvula midline.  Eyes:     General: No scleral icterus.    Conjunctiva/sclera: Conjunctivae normal.  Cardiovascular:     Rate and Rhythm: Normal rate and regular rhythm.     Heart sounds: Normal heart sounds. No murmur. No friction rub. No gallop.   Pulmonary:     Effort: Pulmonary effort is normal. No respiratory distress.     Breath sounds: Wheezing present.     Comments: No labored breathing, Minimal expiratory wheeze Abdominal:     General: Bowel sounds are normal. There is no distension.     Palpations: Abdomen is soft. There is no mass.     Tenderness: There is abdominal tenderness in the suprapubic area. There is no guarding.  Musculoskeletal:     Cervical back: Normal range of motion.  Skin:    General: Skin is warm and dry.  Neurological:     Mental Status: She is alert and oriented to person, place, and time.  Psychiatric:        Behavior: Behavior normal.     ED Results / Procedures / Treatments   Labs (all labs ordered are listed, but only abnormal results are displayed) Labs Reviewed  URINALYSIS, ROUTINE W REFLEX MICROSCOPIC    EKG None  Radiology No results found.  Procedures Procedures (including critical care time)  Medications Ordered in ED Medications - No data to display  ED Course  I have reviewed the triage vital signs and the nursing  notes.  Pertinent labs & imaging results that were available during my care of the patient were reviewed by me and considered in my medical decision making (see chart for details).    MDM Rules/Calculators/A&P                      This is a 44 year old female with a past medical history of diabetes, asthma.  She has minimal asthma with normal oxygen saturation and no labored breathing.  CBG is 75.  Patient be treated with Medrol Dosepak, Pulmicort and albuterol.  She has persistent urinary symptoms.  Patient had a CT scan that showed some bladder wall thickening and mild fat stranding around the bladder on 30 April.  She is taken a course of Keflex without improvement.  She denies any vaginal symptoms.  Review of the patient's culture and sensitivity reports shows that she is only had 1 equivocally positive culture back and July 2020 which showed 50,000 colonies of staph aureus.  This is always questionable for contamination.  Perhaps the patient has a secondary cause of her symptoms question something like interstitial cystitis and I think she will need close follow-up with her primary care physician.  I have treated her here in the emergency department with fosfomycin single dose and send her urine today for culture and sensitivity.  Patient is advised to call her PCP today to set up close follow-up.  Given Motrin for pain.  Appears otherwise appropriate for discharge at this time Final Clinical Impression(s) / ED Diagnoses Final diagnoses:  None    Rx / DC Orders ED Discharge Orders    None       , , PA-C 02/10/20 0938    [place], Adam, DO 02/10/20 0941  

## 2020-02-11 ENCOUNTER — Telehealth: Payer: Self-pay | Admitting: Family Medicine

## 2020-02-11 LAB — URINE CULTURE: Culture: <10000 — AB

## 2020-02-11 NOTE — Telephone Encounter (Signed)
Called Pt to make appointment Pt has Voicemail that has not been set up

## 2020-02-13 ENCOUNTER — Telehealth: Payer: Self-pay | Admitting: Family Medicine

## 2020-02-13 NOTE — Telephone Encounter (Signed)
Call Pt again made several attempts Pt has a voicemail that has not been set up

## 2020-02-25 ENCOUNTER — Inpatient Hospital Stay: Payer: Self-pay | Admitting: Family Medicine

## 2020-03-19 ENCOUNTER — Other Ambulatory Visit: Payer: Self-pay

## 2020-03-19 ENCOUNTER — Ambulatory Visit (HOSPITAL_COMMUNITY)
Admission: EM | Admit: 2020-03-19 | Discharge: 2020-03-19 | Disposition: A | Discharge status: Home / Self Care | Payer: Medicaid Other | Attending: Family Medicine | Admitting: Family Medicine

## 2020-03-19 ENCOUNTER — Encounter (HOSPITAL_COMMUNITY): Payer: Self-pay

## 2020-03-19 DIAGNOSIS — R109 Unspecified abdominal pain: Secondary | ICD-10-CM

## 2020-03-19 LAB — POCT URINALYSIS DIP (DEVICE)
Bilirubin Urine: NEGATIVE
Glucose, UA: NEGATIVE mg/dL
Ketones, ur: NEGATIVE mg/dL
Leukocytes,Ua: NEGATIVE
Nitrite: NEGATIVE
Protein, ur: NEGATIVE mg/dL
Specific Gravity, Urine: >=1.03 (ref 1.005–1.030)
Urobilinogen, UA: 0.2 mg/dL (ref 0.0–1.0)
pH: 5.5 (ref 5.0–8.0)

## 2020-03-19 MED ORDER — KETOROLAC TROMETHAMINE 60 MG/2ML IM SOLN
INTRAMUSCULAR | Status: AC
  Filled 2020-03-19: qty 2

## 2020-03-19 MED ORDER — NAPROXEN 500 MG PO TABS
500.0000 mg | ORAL_TABLET | Freq: Two times a day (BID) | ORAL | 2 refills | Status: DC | PRN
Start: 2020-03-19 — End: 2020-04-04

## 2020-03-19 MED ORDER — KETOROLAC TROMETHAMINE 60 MG/2ML IM SOLN
60.0000 mg | Freq: Once | INTRAMUSCULAR | Status: AC
  Administered 2020-03-19: 60 mg via INTRAMUSCULAR

## 2020-03-19 NOTE — ED Provider Notes (Signed)
[facility]    CSN: [id number] Arrival date & time: 03/19/20  1301      History   Chief Complaint Chief Complaint  Patient presents with  . Flank Pain    HPI Jennifer Crawford is a 44 y.o. female with past medical history of diabetes, seizures, anxiety and depression presents to urgent care today with 3-day history of left flank pain and frequent urination.  Patient states it feels like she has a "kidney infection".  Patient also notices blood in urine.  She denies any fever but does report occasional chills.  States she last took ibuprofen yesterday.  Denies diarrhea or constipation, no melena or hematochezia.  Patient has been seen for similar complaints in the past with mostly unremarkable work-up aside from occasional urinary tract infection and hematuria.     Past Medical History:  Diagnosis Date  . Anxiety and depression   . Diabetes mellitus without complication ([place])   . Right flank pain 05/2019  . Right upper quadrant pain 8/22020  . Seizures ([place]) 2015  . Vitamin D deficiency 05/2019    Patient Active Problem List   Diagnosis Date Noted  . Hypoglycemia 06/27/2019  . Hematuria 06/27/2019  . Low back pain with sciatica 05/31/2019  . Left flank pain 05/31/2019  . Intractable headache 05/13/2019  . Right upper quadrant pain 05/13/2019  . Right flank pain 05/13/2019  . Diabetes mellitus type 2 in nonobese ([place]) 06/20/2015  . Elevated blood alcohol level 06/20/2015  . Anxiety and depression 06/20/2015  . Seizure disorder ([place]) 06/19/2015    Past Surgical History:  Procedure Laterality Date  . ABDOMINAL HYSTERECTOMY    . TUBAL LIGATION      OB History   No obstetric history on file.      Home Medications    Prior to Admission medications   Medication Sig Start Date End Date Taking? Authorizing Provider  albuterol (PROVENTIL) (2.5 MG/3ML) 0.083% nebulizer solution Take 2.5 mg by nebulization every 6 (six) hours as needed for wheezing or  shortness of breath.   Yes [provider]  albuterol (VENTOLIN HFA) 108 (90 Base) MCG/ACT inhaler Inhale 2 puffs into the lungs every 6 (six) hours as needed for wheezing or shortness of breath.   Yes [provider]  levETIRAcetam (KEPPRA) 500 MG tablet Take 2 tablets (1,000 mg total) by mouth 2 (two) times daily. 12/27/19  Yes Ripley Fraise, MD  albuterol (VENTOLIN HFA) 108 (90 Base) MCG/ACT inhaler Inhale 2 puffs into the lungs every 4 (four) hours as needed for wheezing or shortness of breath. 02/10/20   Margarita Mail, PA-C  blood glucose meter kit and supplies Dispense based on patient and insurance preference. Use up to four times daily as directed. (FOR ICD-10 E10.9, E11.9). 05/13/19   Azzie Glatter, FNP  Budesonide (PULMICORT FLEXHALER) 90 MCG/ACT inhaler Inhale 1 puff into the lungs 2 (two) times daily. Rinse mouth with water and spit after every use. 02/10/20   Margarita Mail, PA-C  HYDROcodone-acetaminophen (NORCO) 5-325 MG tablet Take 1-2 tablets by mouth every 6 (six) hours as needed. 01/30/20   Veryl Speak, MD  hydrOXYzine (ATARAX/VISTARIL) 25 MG tablet Take 1 tablet (25 mg total) by mouth every 6 (six) hours. 12/14/19   Robyn Haber, MD  naproxen (NAPROSYN) 500 MG tablet Take 1 tablet (500 mg total) by mouth 2 (two) times daily as needed. 03/19/20 03/19/21  Rudolpho Sevin, NP  Vitamin D, Ergocalciferol, (DRISDOL) 1.25 MG (50000 UT) CAPS capsule Take  1 capsule (50,000 Units total) by mouth every 7 (seven) days. 05/15/19   Azzie Glatter, FNP    Family History Family History  Problem Relation Age of Onset  . Seizures Daughter   . Asthma Son   . Hypertension Father   . Arrhythmia Father        Status post pacemaker insertion  . Hypertension Mother   . Diabetes Mother     Social History Social History   Tobacco Use  . Smoking status: Never Smoker  . Smokeless tobacco: Never Used  Vaping Use  . Vaping Use: Never used  Substance Use Topics  .  Alcohol use: Not Currently    Alcohol/week: 0.0 standard drinks    Comment: Occasional social  . Drug use: No     Allergies   Tramadol and Acetaminophen   Review of Systems As stated in HPI otherwise negative   Physical Exam Triage Vital Signs ED Triage Vitals  Enc Vitals Group     BP 03/19/20 1422 128/78     Pulse Rate 03/19/20 1422 81     Resp 03/19/20 1422 16     Temp 03/19/20 1422 98.1 F (36.7 C)     Temp Source 03/19/20 1422 Oral     SpO2 03/19/20 1422 100 %     Weight --      Height --      Head Circumference --      Peak Flow --      Pain Score 03/19/20 1424 9     Pain Loc --      Pain Edu? --      Excl. in [place]? --    No data found.  Updated Vital Signs BP 128/78 (BP Location: Left Arm)   Pulse 81   Temp 98.1 F (36.7 C) (Oral)   Resp 16   SpO2 100%    Physical Exam Constitutional:      Appearance: Normal appearance. She is normal weight.  HENT:     Mouth/Throat:     Mouth: Mucous membranes are moist.  Abdominal:     General: Abdomen is flat. Bowel sounds are normal. There is no distension.     Palpations: Abdomen is soft. There is no mass.     Tenderness: There is left CVA tenderness.  Skin:    General: Skin is warm and dry.  Neurological:     Mental Status: She is alert.  Psychiatric:        Mood and Affect: Mood normal.        Behavior: Behavior normal.      UC Treatments / Results  Labs (all labs ordered are listed, but only abnormal results are displayed) Labs Reviewed  POCT URINALYSIS DIP (DEVICE) - Abnormal; Notable for the following components:      Result Value   Hgb urine dipstick MODERATE (*)    All other components within normal limits    EKG   Radiology No results found.  Procedures Procedures (including critical care time)  Medications Ordered in UC Medications  ketorolac (TORADOL) injection 60 mg (has no administration in time range)    Initial Impression / Assessment and Plan / UC Course  I have  reviewed the triage vital signs and the nursing notes.  Pertinent labs & imaging results that were available during my care of the patient were reviewed by me and considered in my medical decision making (see chart for details).  Left flank pain, hematuria -Patient with history of same, scans  in the past negative for kidney stone however cannot rule out for this visit vs cystitis -Toradol IM, Rx for naproxen, push fluids -Send urine for culture to determine need for any antibiotics -ER for any increased abdominal pain or fever  Final Clinical Impressions(s) / UC Diagnoses   Final diagnoses:  Flank pain     Discharge Instructions     You have been given a shot for your pain.  We will send your urine to be cultured and let you know if you need antibiotics.  You can take naproxen every 12 hours as needed for pain.  This has been sent to the pharmacy    ED Prescriptions    Medication Sig Dispense Auth. Provider   naproxen (NAPROSYN) 500 MG tablet Take 1 tablet (500 mg total) by mouth 2 (two) times daily as needed. 60 tablet Rudolpho Sevin, NP     PDMP not reviewed this encounter.   Rudolpho Sevin, NP 03/19/20 1513

## 2020-03-19 NOTE — Discharge Instructions (Signed)
You have been given a shot for your pain.  We will send your urine to be cultured and let you know if you need antibiotics.  You can take naproxen every 12 hours as needed for pain.  This has been sent to the pharmacy

## 2020-03-19 NOTE — ED Triage Notes (Signed)
Pt presents with complaints of left side flank pain. Pt endorses generalized fatigue. The pain is worse with movement. Pt states history of kidney issues in the past. Denies any other symptoms.

## 2020-03-20 ENCOUNTER — Encounter (HOSPITAL_COMMUNITY): Payer: Self-pay | Admitting: *Deleted

## 2020-03-20 ENCOUNTER — Emergency Department (HOSPITAL_COMMUNITY): Payer: Medicaid Other

## 2020-03-20 ENCOUNTER — Emergency Department (HOSPITAL_COMMUNITY)
Admission: EM | Admit: 2020-03-20 | Discharge: 2020-03-20 | Disposition: A | Discharge status: Home / Self Care | Payer: Medicaid Other | Attending: Emergency Medicine | Admitting: Emergency Medicine

## 2020-03-20 ENCOUNTER — Other Ambulatory Visit: Payer: Self-pay

## 2020-03-20 DIAGNOSIS — E119 Type 2 diabetes mellitus without complications: Secondary | ICD-10-CM | POA: Diagnosis not present

## 2020-03-20 DIAGNOSIS — N390 Urinary tract infection, site not specified: Secondary | ICD-10-CM | POA: Diagnosis not present

## 2020-03-20 DIAGNOSIS — R1012 Left upper quadrant pain: Secondary | ICD-10-CM | POA: Diagnosis present

## 2020-03-20 LAB — CBC
HCT: 37.9 % (ref 36.0–46.0)
Hemoglobin: 12.2 g/dL (ref 12.0–15.0)
MCH: 30.6 pg (ref 26.0–34.0)
MCHC: 32.2 g/dL (ref 30.0–36.0)
MCV: 95 fL (ref 80.0–100.0)
Platelets: 193 10*3/uL (ref 150–400)
RBC: 3.99 MIL/uL (ref 3.87–5.11)
RDW: 13.1 % (ref 11.5–15.5)
WBC: 5.2 10*3/uL (ref 4.0–10.5)
nRBC: 0 % (ref 0.0–0.2)

## 2020-03-20 LAB — URINALYSIS, MICROSCOPIC (REFLEX)

## 2020-03-20 LAB — COMPREHENSIVE METABOLIC PANEL
ALT: 35 U/L (ref 0–44)
AST: 26 U/L (ref 15–41)
Albumin: 4 g/dL (ref 3.5–5.0)
Alkaline Phosphatase: 54 U/L (ref 38–126)
Anion gap: 8 (ref 5–15)
BUN: 14 mg/dL (ref 6–20)
CO2: 23 mmol/L (ref 22–32)
Calcium: 9.1 mg/dL (ref 8.9–10.3)
Chloride: 107 mmol/L (ref 98–111)
Creatinine, Ser: 0.89 mg/dL (ref 0.44–1.00)
GFR calc Af Amer: >60 mL/min (ref >60)
GFR calc non Af Amer: >60 mL/min (ref >60)
Glucose, Bld: 88 mg/dL (ref 70–99)
Potassium: 4.3 mmol/L (ref 3.5–5.1)
Sodium: 138 mmol/L (ref 135–145)
Total Bilirubin: 0.9 mg/dL (ref 0.3–1.2)
Total Protein: 7.5 g/dL (ref 6.5–8.1)

## 2020-03-20 LAB — URINALYSIS, ROUTINE W REFLEX MICROSCOPIC
Bilirubin Urine: NEGATIVE
Glucose, UA: NEGATIVE mg/dL
Ketones, ur: NEGATIVE mg/dL
Nitrite: NEGATIVE
Protein, ur: 30 mg/dL — AB
Specific Gravity, Urine: >1.03 — ABNORMAL HIGH (ref 1.005–1.030)
pH: 5 (ref 5.0–8.0)

## 2020-03-20 LAB — URINE CULTURE: Culture: NO GROWTH

## 2020-03-20 LAB — I-STAT BETA HCG BLOOD, ED (MC, WL, AP ONLY): I-stat hCG, quantitative: <5 m[IU]/mL (ref <5)

## 2020-03-20 LAB — LIPASE, BLOOD: Lipase: 30 U/L (ref 11–51)

## 2020-03-20 MED ORDER — SODIUM CHLORIDE 0.9% FLUSH
3.0000 mL | Freq: Once | INTRAVENOUS | Status: AC
  Administered 2020-03-20: 3 mL via INTRAVENOUS

## 2020-03-20 MED ORDER — ONDANSETRON HCL 4 MG/2ML IJ SOLN
4.0000 mg | Freq: Once | INTRAMUSCULAR | Status: AC
  Administered 2020-03-20: 4 mg via INTRAVENOUS
  Filled 2020-03-20: qty 2

## 2020-03-20 MED ORDER — MORPHINE SULFATE (PF) 4 MG/ML IV SOLN
4.0000 mg | Freq: Once | INTRAVENOUS | Status: AC
  Administered 2020-03-20: 4 mg via INTRAVENOUS
  Filled 2020-03-20: qty 1

## 2020-03-20 MED ORDER — SULFAMETHOXAZOLE-TRIMETHOPRIM 800-160 MG PO TABS: 1.0000 | ORAL_TABLET | Freq: Two times a day (BID) | ORAL | 0 refills | Status: AC

## 2020-03-20 MED ORDER — SODIUM CHLORIDE 0.9 % IV BOLUS
1000.0000 mL | Freq: Once | INTRAVENOUS | Status: AC
  Administered 2020-03-20: 1000 mL via INTRAVENOUS

## 2020-03-20 MED ORDER — SULFAMETHOXAZOLE-TRIMETHOPRIM 800-160 MG PO TABS
1.0000 | ORAL_TABLET | Freq: Once | ORAL | Status: AC
  Administered 2020-03-20: 1 via ORAL
  Filled 2020-03-20: qty 1

## 2020-03-20 MED ORDER — IOHEXOL 300 MG/ML  SOLN
100.0000 mL | Freq: Once | INTRAMUSCULAR | Status: AC | PRN
  Administered 2020-03-20: 100 mL via INTRAVENOUS

## 2020-03-20 MED ORDER — KETOROLAC TROMETHAMINE 30 MG/ML IJ SOLN
30.0000 mg | Freq: Once | INTRAMUSCULAR | Status: AC
  Administered 2020-03-20: 30 mg via INTRAVENOUS
  Filled 2020-03-20: qty 1

## 2020-03-20 NOTE — ED Provider Notes (Signed)
[facility] Provider Note   CSN: [id number] Arrival date & time: 03/20/20  1522     History Chief Complaint  Patient presents with  . Abdominal Pain    Jennifer Crawford is a 44 y.o. female presenting to the ED with complaint of 3 days of LUQ and left flank pain.  Pain is described as a throbbing pain, comes and goes.  Feels she cannot get comfortable.  She also has decreased energy and generalized weakness.  She reports increased urinary frequency and hematuria.  Denies associated nausea, vomiting, diarrhea, constipation.  No pelvic complaints.  No dysuria.  She went to urgent care yesterday and has been treating symptoms with naproxen.  She presents today for persisting symptoms.  No known history of nephrolithiasis.  The history is provided by the patient.       Past Medical History:  Diagnosis Date  . Anxiety and depression   . Diabetes mellitus without complication ([place])   . Right flank pain 05/2019  . Right upper quadrant pain 8/22020  . Seizures ([place]) 2015  . Vitamin D deficiency 05/2019    Patient Active Problem List   Diagnosis Date Noted  . Hypoglycemia 06/27/2019  . Hematuria 06/27/2019  . Low back pain with sciatica 05/31/2019  . Left flank pain 05/31/2019  . Intractable headache 05/13/2019  . Right upper quadrant pain 05/13/2019  . Right flank pain 05/13/2019  . Diabetes mellitus type 2 in nonobese ([place]) 06/20/2015  . Elevated blood alcohol level 06/20/2015  . Anxiety and depression 06/20/2015  . Seizure disorder ([place]) 06/19/2015    Past Surgical History:  Procedure Laterality Date  . ABDOMINAL HYSTERECTOMY    . TUBAL LIGATION       OB History   No obstetric history on file.     Family History  Problem Relation Age of Onset  . Seizures Daughter   . Asthma Son   . Hypertension Father   . Arrhythmia Father        Status post pacemaker insertion  . Hypertension Mother   . Diabetes Mother      Social History   Tobacco Use  . Smoking status: Never Smoker  . Smokeless tobacco: Never Used  Vaping Use  . Vaping Use: Never used  Substance Use Topics  . Alcohol use: Yes    Alcohol/week: 0.0 standard drinks    Comment: Occasional social  . Drug use: No    Home Medications Prior to Admission medications   Medication Sig Start Date End Date Taking? Authorizing Provider  albuterol (PROVENTIL) (2.5 MG/3ML) 0.083% nebulizer solution Take 2.5 mg by nebulization every 6 (six) hours as needed for wheezing or shortness of breath.    [provider]  albuterol (VENTOLIN HFA) 108 (90 Base) MCG/ACT inhaler Inhale 2 puffs into the lungs every 6 (six) hours as needed for wheezing or shortness of breath.    [provider]  albuterol (VENTOLIN HFA) 108 (90 Base) MCG/ACT inhaler Inhale 2 puffs into the lungs every 4 (four) hours as needed for wheezing or shortness of breath. 02/10/20   Margarita Mail, PA-C  blood glucose meter kit and supplies Dispense based on patient and insurance preference. Use up to four times daily as directed. (FOR ICD-10 E10.9, E11.9). 05/13/19   Azzie Glatter, FNP  Budesonide (PULMICORT FLEXHALER) 90 MCG/ACT inhaler Inhale 1 puff into the lungs 2 (two) times daily. Rinse mouth with water and spit after every use. 02/10/20   Margarita Mail,  PA-C  HYDROcodone-acetaminophen (NORCO) 5-325 MG tablet Take 1-2 tablets by mouth every 6 (six) hours as needed. 01/30/20   Veryl Speak, MD  hydrOXYzine (ATARAX/VISTARIL) 25 MG tablet Take 1 tablet (25 mg total) by mouth every 6 (six) hours. 12/14/19   Robyn Haber, MD  levETIRAcetam (KEPPRA) 500 MG tablet Take 2 tablets (1,000 mg total) by mouth 2 (two) times daily. 12/27/19   Ripley Fraise, MD  naproxen (NAPROSYN) 500 MG tablet Take 1 tablet (500 mg total) by mouth 2 (two) times daily as needed. 03/19/20 03/19/21  Rudolpho Sevin, NP  sulfamethoxazole-trimethoprim (BACTRIM DS) 800-160 MG tablet Take 1 tablet  by mouth 2 (two) times daily for 10 days. 03/20/20 03/30/20  Zakaree Mcclenahan, [place] N, PA-C  Vitamin D, Ergocalciferol, (DRISDOL) 1.25 MG (50000 UT) CAPS capsule Take 1 capsule (50,000 Units total) by mouth every 7 (seven) days. 05/15/19   Azzie Glatter, FNP    Allergies    Tramadol and Acetaminophen  Review of Systems   Review of Systems  All other systems reviewed and are negative.   Physical Exam Updated Vital Signs BP 122/90 (BP Location: Right Arm)   Pulse 69   Temp 98.3 F (36.8 C) (Oral)   Resp 14   Ht _0  (1.854 m)   Wt 74.8 kg   SpO2 100%   BMI 21.76 kg/m   Physical Exam Vitals and nursing note reviewed.  Constitutional:      General: She is not in acute distress.    Appearance: She is well-developed. She is not ill-appearing.  HENT:     Head: Normocephalic and atraumatic.  Eyes:     Conjunctiva/sclera: Conjunctivae normal.  Cardiovascular:     Rate and Rhythm: Normal rate and regular rhythm.  Pulmonary:     Effort: Pulmonary effort is normal. No respiratory distress.     Breath sounds: Normal breath sounds.  Abdominal:     General: Abdomen is flat. Bowel sounds are normal.     Palpations: Abdomen is soft.     Tenderness: There is abdominal tenderness in the left upper quadrant. There is left CVA tenderness. There is no guarding or rebound.    Skin:    General: Skin is warm.     Findings: No rash.  Neurological:     Mental Status: She is alert.  Psychiatric:        Mood and Affect: Mood normal.        Behavior: Behavior normal.     ED Results / Procedures / Treatments   Labs (all labs ordered are listed, but only abnormal results are displayed) Labs Reviewed  URINALYSIS, ROUTINE W REFLEX MICROSCOPIC - Abnormal; Notable for the following components:      Result Value   APPearance HAZY (*)    Specific Gravity, Urine >1.030 (*)    Hgb urine dipstick TRACE (*)    Protein, ur 30 (*)    Leukocytes,Ua TRACE (*)    All other components within normal  limits  URINALYSIS, MICROSCOPIC (REFLEX) - Abnormal; Notable for the following components:   Bacteria, UA FEW (*)    All other components within normal limits  URINE CULTURE  LIPASE, BLOOD  COMPREHENSIVE METABOLIC PANEL  CBC  I-STAT BETA HCG BLOOD, ED ([facility], [facility], [facility] ONLY)    EKG None  Radiology CT Abdomen Pelvis W Contrast  Result Date: 03/20/2020 CLINICAL DATA:  Left upper quadrant pain. Left flank pain for 3 days. Hematuria. EXAM: CT ABDOMEN AND PELVIS WITH CONTRAST TECHNIQUE: Multidetector  CT imaging of the abdomen and pelvis was performed using the standard protocol following bolus administration of intravenous contrast. CONTRAST:  117m OMNIPAQUE IOHEXOL 300 MG/ML  SOLN COMPARISON:  Noncontrast CT 01/30/2020 FINDINGS: Lower chest: The lung bases are clear. Hepatobiliary: No focal liver abnormality is seen. No gallstones, gallbladder wall thickening, or biliary dilatation. Pancreas: No ductal dilatation or inflammation. Spleen: Normal in size without focal abnormality. Adrenals/Urinary Tract: Normal adrenal glands. No hydronephrosis or perinephric edema. No evidence of focal renal lesion. Homogeneous enhancement with symmetric excretion on delayed phase imaging. No obvious renal calculus on contrast-enhanced exam, no stones are seen on prior noncontrast exam. Both ureters are decompressed. The urinary bladder is partially distended. Equivocal wall thickening versus nondistention. Stomach/Bowel: Unremarkable stomach. Normal positioning of the duodenum and ligament of Treitz. There is no small bowel obstruction or inflammatory change. Normal appendix. Small to moderate colonic stool burden. Transverse and sigmoid colonic tortuosity. No colonic wall thickening or inflammatory change. Vascular/Lymphatic: Normal abdominal aorta. Patent portal vein. No acute vascular findings. No enlarged lymph nodes in the abdomen or pelvis. Reproductive: Hysterectomy. Left ovary appears normal. Right ovary tentatively  seen and normal. No suspicious adnexal mass. Other: Trace free fluid in the pelvis. No free air. No focal fluid collection. No significant abdominal wall hernia. Musculoskeletal: There are no acute or suspicious osseous abnormalities. Mild degenerative disc disease at L5-S1. IMPRESSION: 1. Equivocal bladder wall thickening versus nondistention. Recommend correlation with urinalysis to exclude urinary tract infection. 2. No other acute abnormality in the abdomen/pelvis. No specific explanation for left-sided pain. Electronically Signed   By: MKeith RakeM.D.   On: 03/20/2020 19:36    Procedures Procedures (including critical care time)  Medications Ordered in ED Medications  ketorolac (TORADOL) 30 MG/ML injection 30 mg (has no administration in time range)  sulfamethoxazole-trimethoprim (BACTRIM DS) 800-160 MG per tablet 1 tablet (has no administration in time range)  sodium chloride flush (NS) 0.9 % injection 3 mL (3 mLs Intravenous Given 03/20/20 1856)  sodium chloride 0.9 % bolus 1,000 mL (1,000 mLs Intravenous New Bag/Given 03/20/20 1855)  morphine 4 MG/ML injection 4 mg (4 mg Intravenous Given 03/20/20 1852)  ondansetron (ZOFRAN) injection 4 mg (4 mg Intravenous Given 03/20/20 1852)  iohexol (OMNIPAQUE) 300 MG/ML solution 100 mL (100 mLs Intravenous Contrast Given 03/20/20 1904)    ED Course  I have reviewed the triage vital signs and the nursing notes.  Pertinent labs & imaging results that were available during my care of the patient were reviewed by me and considered in my medical decision making (see chart for details).    MDM Rules/Calculators/A&P                          Patient presenting with urinary frequency, urinary urgency, hematuria, and left upper abdominal/left flank pain for 3 days.  On exam she has tenderness to her abdomen and flank, no skin changes.  No peritoneal signs.  She is otherwise hemodynamically stable.  Labs obtained in triage are very reassuring, no  leukocytosis or electrolyte derangement.  Normal renal and hepatic function.  Lipase within normal limits.  UA appears to be contaminated specimen though does show few bacteria with 6-10 white cells and trace leuks.  There is also trace hemoglobin.  Will obtain CT scan of patient's abdomen given patient's tenderness and persisting symptoms.  Pain treated, IV fluids.  CT scan is without stone or GI cause of symptoms.  It does show bladder  wall thickening consistent with a cystitis.  This may be causing patient's urinary symptoms.  Given patient's flank pain, will cover for pyelonephritis with Bactrim.  It appears she is only dosing the naproxen once daily, encouraged she take twice daily to help with pain.  Close PCP follow-up this week and strict return precautions.  She is well-appearing, agreeable plan, safe for discharge.  Final Clinical Impression(s) / ED Diagnoses Final diagnoses:  Acute UTI (urinary tract infection)    Rx / DC Orders ED Discharge Orders         Ordered    sulfamethoxazole-trimethoprim (BACTRIM DS) 800-160 MG tablet  2 times daily     Discontinue  Reprint     03/20/20 2010           Dortha Neighbors, [place] N, PA-C 03/20/20 2016    Quintella Reichert, MD 03/21/20 0002

## 2020-03-20 NOTE — ED Triage Notes (Signed)
The pt was [facility] yesterday and was diagnosed with kidney stones  She was given naproxen  For pain but that is not hekpinf  She has had flank pain for e3 days  lmp none

## 2020-03-20 NOTE — Discharge Instructions (Signed)
It is important that you stay hydrated. Take the antibiotic, Bactrim, twice daily until gone. Follow-up with your primary care provider within 3 to 5 days. Return to the emergency department for high fever, uncontrollable vomiting, or severely worsening symptoms.

## 2020-03-22 LAB — URINE CULTURE: Culture: 50000 — AB

## 2020-04-02 ENCOUNTER — Ambulatory Visit: Payer: Medicaid Other | Admitting: Neurology

## 2020-04-04 ENCOUNTER — Emergency Department (HOSPITAL_COMMUNITY)
Admission: EM | Admit: 2020-04-04 | Discharge: 2020-04-04 | Disposition: A | Discharge status: Home / Self Care | Payer: Medicaid Other | Attending: Emergency Medicine | Admitting: Emergency Medicine

## 2020-04-04 ENCOUNTER — Encounter (HOSPITAL_COMMUNITY): Payer: Self-pay | Admitting: Emergency Medicine

## 2020-04-04 ENCOUNTER — Encounter (HOSPITAL_COMMUNITY): Payer: Self-pay

## 2020-04-04 ENCOUNTER — Other Ambulatory Visit: Payer: Self-pay

## 2020-04-04 ENCOUNTER — Ambulatory Visit (HOSPITAL_COMMUNITY)
Admission: EM | Admit: 2020-04-04 | Discharge: 2020-04-04 | Disposition: A | Discharge status: Home / Self Care | Payer: Medicaid Other | Attending: Family Medicine | Admitting: Family Medicine

## 2020-04-04 ENCOUNTER — Emergency Department (HOSPITAL_COMMUNITY): Payer: Medicaid Other

## 2020-04-04 DIAGNOSIS — R0789 Other chest pain: Secondary | ICD-10-CM | POA: Diagnosis not present

## 2020-04-04 DIAGNOSIS — J069 Acute upper respiratory infection, unspecified: Secondary | ICD-10-CM | POA: Insufficient documentation

## 2020-04-04 DIAGNOSIS — E119 Type 2 diabetes mellitus without complications: Secondary | ICD-10-CM | POA: Insufficient documentation

## 2020-04-04 DIAGNOSIS — R0602 Shortness of breath: Secondary | ICD-10-CM | POA: Diagnosis present

## 2020-04-04 DIAGNOSIS — Z79899 Other long term (current) drug therapy: Secondary | ICD-10-CM | POA: Insufficient documentation

## 2020-04-04 DIAGNOSIS — Z7984 Long term (current) use of oral hypoglycemic drugs: Secondary | ICD-10-CM | POA: Insufficient documentation

## 2020-04-04 LAB — CBC WITH DIFFERENTIAL/PLATELET
Abs Immature Granulocytes: 0.01 10*3/uL (ref 0.00–0.07)
Basophils Absolute: 0 10*3/uL (ref 0.0–0.1)
Basophils Relative: 1 %
Eosinophils Absolute: 0.2 10*3/uL (ref 0.0–0.5)
Eosinophils Relative: 3 %
HCT: 36.2 % (ref 36.0–46.0)
Hemoglobin: 12 g/dL (ref 12.0–15.0)
Immature Granulocytes: 0 %
Lymphocytes Relative: 36 %
Lymphs Abs: 2.5 10*3/uL (ref 0.7–4.0)
MCH: 31.8 pg (ref 26.0–34.0)
MCHC: 33.1 g/dL (ref 30.0–36.0)
MCV: 96 fL (ref 80.0–100.0)
Monocytes Absolute: 0.4 10*3/uL (ref 0.1–1.0)
Monocytes Relative: 6 %
Neutro Abs: 3.8 10*3/uL (ref 1.7–7.7)
Neutrophils Relative %: 54 %
Platelets: 182 10*3/uL (ref 150–400)
RBC: 3.77 MIL/uL — ABNORMAL LOW (ref 3.87–5.11)
RDW: 13.2 % (ref 11.5–15.5)
WBC: 7 10*3/uL (ref 4.0–10.5)
nRBC: 0 % (ref 0.0–0.2)

## 2020-04-04 LAB — BASIC METABOLIC PANEL
Anion gap: 9 (ref 5–15)
BUN: 16 mg/dL (ref 6–20)
CO2: 22 mmol/L (ref 22–32)
Calcium: 9.3 mg/dL (ref 8.9–10.3)
Chloride: 106 mmol/L (ref 98–111)
Creatinine, Ser: 1 mg/dL (ref 0.44–1.00)
GFR calc Af Amer: >60 mL/min (ref >60)
GFR calc non Af Amer: >60 mL/min (ref >60)
Glucose, Bld: 100 mg/dL — ABNORMAL HIGH (ref 70–99)
Potassium: 3.9 mmol/L (ref 3.5–5.1)
Sodium: 137 mmol/L (ref 135–145)

## 2020-04-04 LAB — TROPONIN I (HIGH SENSITIVITY): Troponin I (High Sensitivity): 2 ng/L (ref <18)

## 2020-04-04 LAB — BRAIN NATRIURETIC PEPTIDE: B Natriuretic Peptide: 13.5 pg/mL (ref 0.0–100.0)

## 2020-04-04 MED ORDER — METHYLPREDNISOLONE SODIUM SUCC 125 MG IJ SOLR
INTRAMUSCULAR | Status: AC
  Filled 2020-04-04: qty 2

## 2020-04-04 MED ORDER — PULMICORT FLEXHALER 90 MCG/ACT IN AEPB: 1.0000 | INHALATION_SPRAY | Freq: Two times a day (BID) | RESPIRATORY_TRACT | 1 refills | Status: DC

## 2020-04-04 MED ORDER — ALBUTEROL SULFATE (2.5 MG/3ML) 0.083% IN NEBU: 2.5000 mg | INHALATION_SOLUTION | Freq: Four times a day (QID) | RESPIRATORY_TRACT | 0 refills | Status: DC | PRN

## 2020-04-04 MED ORDER — HYDROCODONE-ACETAMINOPHEN 5-325 MG PO TABS
1.0000 | ORAL_TABLET | Freq: Once | ORAL | Status: AC
  Administered 2020-04-04: 1 via ORAL

## 2020-04-04 MED ORDER — DIPHENHYDRAMINE HCL 25 MG PO CAPS
ORAL_CAPSULE | ORAL | Status: AC
  Filled 2020-04-04: qty 1

## 2020-04-04 MED ORDER — METHYLPREDNISOLONE SODIUM SUCC 125 MG IJ SOLR
80.0000 mg | Freq: Once | INTRAMUSCULAR | Status: AC
  Administered 2020-04-04: 80 mg via INTRAMUSCULAR

## 2020-04-04 MED ORDER — ALBUTEROL SULFATE (2.5 MG/3ML) 0.083% IN NEBU: 2.5000 mg | INHALATION_SOLUTION | Freq: Four times a day (QID) | RESPIRATORY_TRACT | 0 refills | Status: AC | PRN

## 2020-04-04 MED ORDER — PREDNISONE 20 MG PO TABS: 20.0000 mg | ORAL_TABLET | Freq: Two times a day (BID) | ORAL | 0 refills | Status: DC

## 2020-04-04 MED ORDER — DIPHENHYDRAMINE HCL 25 MG PO CAPS
25.0000 mg | ORAL_CAPSULE | Freq: Once | ORAL | Status: AC
  Administered 2020-04-04: 25 mg via ORAL

## 2020-04-04 MED ORDER — HYDROCODONE-ACETAMINOPHEN 5-325 MG PO TABS
ORAL_TABLET | ORAL | Status: AC
  Filled 2020-04-04: qty 1

## 2020-04-04 NOTE — ED Provider Notes (Signed)
[facility] EMERGENCY DEPARTMENT Provider Note   CSN: [id number] Arrival date & time: 04/04/20  0277     History Chief Complaint  Patient presents with  . Shortness of Breath    Jennifer Crawford is a 44 y.o. female.  HPI Patient with history of HTN, DM, seizures and asthma presents for evaluation of cough, productive of green sputum, SOB and chest pain with coughing. She denies any fevers. She has been using albuterol inhaler without much improvement. She was not diagnosed with bronchitis yesterday in [place] as the Triage note mentions but she reports the last time she was diagnosed with bronchitis was when she was in [place] some time ago. She reports aching chest pain worse with deep breath and cough. No leg swelling. She also found out a few days ago that she is 4-5 months pregnant. She does not have any pregnancy related complaints today. Patient has had two Covid vaccines.     Past Medical History:  Diagnosis Date  . Anxiety and depression   . Diabetes mellitus without complication ([place])   . Right flank pain 05/2019  . Right upper quadrant pain 8/22020  . Seizures ([place]) 2015  . Vitamin D deficiency 05/2019    Patient Active Problem List   Diagnosis Date Noted  . Hypoglycemia 06/27/2019  . Hematuria 06/27/2019  . Low back pain with sciatica 05/31/2019  . Left flank pain 05/31/2019  . Intractable headache 05/13/2019  . Right upper quadrant pain 05/13/2019  . Right flank pain 05/13/2019  . Diabetes mellitus type 2 in nonobese ([place]) 06/20/2015  . Elevated blood alcohol level 06/20/2015  . Anxiety and depression 06/20/2015  . Seizure disorder ([place]) 06/19/2015    Past Surgical History:  Procedure Laterality Date  . ABDOMINAL HYSTERECTOMY    . TUBAL LIGATION       OB History   No obstetric history on file.     Family History  Problem Relation Age of Onset  . Seizures Daughter   . Asthma Son   . Hypertension Father   . Arrhythmia Father         Status post pacemaker insertion  . Hypertension Mother   . Diabetes Mother     Social History   Tobacco Use  . Smoking status: Never Smoker  . Smokeless tobacco: Never Used  Vaping Use  . Vaping Use: Never used  Substance Use Topics  . Alcohol use: Yes    Alcohol/week: 0.0 standard drinks    Comment: Occasional social  . Drug use: No    Home Medications Prior to Admission medications   Medication Sig Start Date End Date Taking? Authorizing Provider  albuterol (VENTOLIN HFA) 108 (90 Base) MCG/ACT inhaler Inhale 2 puffs into the lungs every 4 (four) hours as needed for wheezing or shortness of breath. 02/10/20  Yes Harris, Abigail, PA-C  levETIRAcetam (KEPPRA) 500 MG tablet Take 2 tablets (1,000 mg total) by mouth 2 (two) times daily. 12/27/19  Yes Ripley Fraise, MD  METFORMIN HCL PO Take 1 tablet by mouth 2 (two) times daily with a meal.   Yes [provider]  albuterol (PROVENTIL) (2.5 MG/3ML) 0.083% nebulizer solution Take 3 mLs (2.5 mg total) by nebulization every 6 (six) hours as needed for wheezing or shortness of breath. 04/04/20   Truddie Hidden, MD  albuterol (VENTOLIN HFA) 108 (90 Base) MCG/ACT inhaler Inhale 2 puffs into the lungs every 6 (six) hours as needed for wheezing or shortness of breath. Patient not taking:  Reported on 04/04/2020    [provider]  blood glucose meter kit and supplies Dispense based on patient and insurance preference. Use up to four times daily as directed. (FOR ICD-10 E10.9, E11.9). 05/13/19   Azzie Glatter, FNP  Budesonide (PULMICORT FLEXHALER) 90 MCG/ACT inhaler Inhale 1 puff into the lungs 2 (two) times daily. Rinse mouth with water and spit after every use. 04/04/20   Truddie Hidden, MD    Allergies    Tramadol and Acetaminophen  Review of Systems   Review of Systems A comprehensive review of systems was completed and negative except as noted in HPI.   Physical Exam Updated Vital Signs BP 128/87 (BP  Location: Left Arm)   Pulse 79   Temp 98.2 F (36.8 C) (Oral)   Resp 18   LMP  (Within Months) Comment: pt stated having a partial hysterectomy, but took a pregnancy test last week and showed as positive, double shielded pt  SpO2 100%   Physical Exam Vitals and nursing note reviewed.  Constitutional:      Appearance: Normal appearance.  HENT:     Head: Normocephalic and atraumatic.     Nose: Nose normal.     Mouth/Throat:     Mouth: Mucous membranes are moist.  Eyes:     Extraocular Movements: Extraocular movements intact.     Conjunctiva/sclera: Conjunctivae normal.  Cardiovascular:     Rate and Rhythm: Normal rate.  Pulmonary:     Effort: Pulmonary effort is normal.     Breath sounds: Normal breath sounds. No wheezing or rhonchi.     Comments: Dry cough Abdominal:     General: Abdomen is flat.     Palpations: Abdomen is soft.     Tenderness: There is no abdominal tenderness.     Comments: Gravid consistent with dates  Musculoskeletal:        General: No swelling. Normal range of motion.     Cervical back: Neck supple.  Skin:    General: Skin is warm and dry.  Neurological:     General: No focal deficit present.     Mental Status: She is alert.  Psychiatric:        Mood and Affect: Mood normal.     ED Results / Procedures / Treatments   Labs (all labs ordered are listed, but only abnormal results are displayed) Labs Reviewed  BASIC METABOLIC PANEL - Abnormal; Notable for the following components:      Result Value   Glucose, Bld 100 (*)    All other components within normal limits  CBC WITH DIFFERENTIAL/PLATELET - Abnormal; Notable for the following components:   RBC 3.77 (*)    All other components within normal limits  BRAIN NATRIURETIC PEPTIDE  TROPONIN I (HIGH SENSITIVITY)  TROPONIN I (HIGH SENSITIVITY)    EKG EKG Interpretation  Date/Time:  Sunday April 04 2020 07:21:43 EDT Ventricular Rate:  76 PR Interval:  190 QRS Duration: 72 QT  Interval:  372 QTC Calculation: 418 R Axis:   75 Text Interpretation: Normal sinus rhythm Nonspecific T wave abnormality Abnormal ECG No significant change since last tracing Confirmed by Calvert Cantor [phone]) on 04/04/2020 7:35:26 AM   Radiology DG Chest 2 View  Result Date: 04/04/2020 CLINICAL DATA:  Shortness of breath and cough EXAM: CHEST - 2 VIEW COMPARISON:  December 27, 2019 FINDINGS: The heart size and mediastinal contours are within normal limits. Both lungs are clear. The visualized skeletal structures are unremarkable. IMPRESSION: No active cardiopulmonary  disease. Electronically Signed   By: Abelardo Diesel M.D.   On: 04/04/2020 08:54    Procedures Procedures (including critical care time)  Medications Ordered in ED Medications - No data to display  ED Course  I have reviewed the triage vital signs and the nursing notes.  Pertinent labs & imaging results that were available during my care of the patient were reviewed by me and considered in my medical decision making (see chart for details).  Clinical Course as of Apr 04 928  Sun Apr 04, 2020  0805 CBC is normal   [CS]  0829 BMP and Trop are normal   [CS]  9923 BNP is normal. CXR is clear. I discussed these results with the patient. No indication for Abx. Will hold on steroids given no wheezing and pregnancy. Refill of albuterol ampules. She is concerned that she is not actually pregnant given that she has not felt the baby move. She does not have any acute pregnancy or pelvic related complaints today and prefers to followup with Ob/Gyn as an outpatient.    [CS]    Clinical Course User Index [CS] Truddie Hidden, MD   MDM Rules/Calculators/A&P                         Patient with cough, SOB and pleuritic chest pain. No significant wheezing on exam. Will check labs including CBC, BMP Trop and BNP. Discussed risks/benefits of CXR during pregnancy and she would like to proceed.   Final Clinical Impression(s) / ED  Diagnoses Final diagnoses:  Viral URI    Rx / DC Orders ED Discharge Orders         Ordered    albuterol (PROVENTIL) (2.5 MG/3ML) 0.083% nebulizer solution  Every 6 hours PRN     Discontinue  Reprint     04/04/20 0927    Budesonide (PULMICORT FLEXHALER) 90 MCG/ACT inhaler  2 times daily     Discontinue  Reprint     04/04/20 0927           Truddie Hidden, MD 04/04/20 [phone]

## 2020-04-04 NOTE — ED Notes (Signed)
Blood has been collected and sent to main lab.

## 2020-04-04 NOTE — ED Triage Notes (Signed)
Pt. Stated, I have bronchitis and asthma and it started today with some chest pain, and SOB. I was diagnosed with bronchitis yesterday in [place]

## 2020-04-04 NOTE — ED Notes (Signed)
Pt called out stating that she was having a hard time breathing. Pt at 100% with a good pleth. Informed Dr. Bernette Mayers.

## 2020-04-04 NOTE — Discharge Instructions (Signed)
Take prednisone 2 times a day Start the prednisone tomorrow Continue using your inhaler Drink plenty of fluids You may take Advil (ibuprofen) or Aleve (naproxen)for pain.  These are anti-inflammatory pain medications that help with chest wall pain and strains of the ribs

## 2020-04-04 NOTE — ED Notes (Signed)
Patient verbalizes understanding of discharge instructions. Opportunity for questioning and answers were provided. Armband removed by staff, pt discharged from ED. Ambulated out to lobby  

## 2020-04-04 NOTE — ED Triage Notes (Signed)
Patient complaining of pain, shortness of breath, and cough. Reports she was diagnosed yesterday with bronchitis and was seen in [facility] ED this morning.

## 2020-04-05 NOTE — ED Provider Notes (Signed)
[place]    CSN: [id number] Arrival date & time: 04/04/20  1601      History   Chief Complaint Chief Complaint  Patient presents with  . Shortness of Breath  . Cough  . Nasal Congestion    HPI Jennifer Crawford is a 44 y.o. female.   HPI  Apparently patient was seen yesterday at [facility] for respiratory infection. She was seen at the emergency room this morning for similar coughing and symptoms.  She had a chest x-ray that was negative.  She is given inhalers. She is here this evening because she states that all the coughing is caused her to have chest pain.  Pain with inhalation.  She is grasping at her chest, crying, rocking back and forth saying that her pain is "severe". Her status otherwise has not changed since her emergency room evaluation a few hours ago. No increased shortness of breath No change in sputum production No spiking fevers or chills  Past Medical History:  Diagnosis Date  . Anxiety and depression   . Diabetes mellitus without complication ([place])   . Right flank pain 05/2019  . Right upper quadrant pain 8/22020  . Seizures ([place]) 2015  . Vitamin D deficiency 05/2019    Patient Active Problem List   Diagnosis Date Noted  . Hypoglycemia 06/27/2019  . Hematuria 06/27/2019  . Low back pain with sciatica 05/31/2019  . Left flank pain 05/31/2019  . Intractable headache 05/13/2019  . Right upper quadrant pain 05/13/2019  . Right flank pain 05/13/2019  . Diabetes mellitus type 2 in nonobese ([place]) 06/20/2015  . Elevated blood alcohol level 06/20/2015  . Anxiety and depression 06/20/2015  . Seizure disorder ([place]) 06/19/2015    Past Surgical History:  Procedure Laterality Date  . ABDOMINAL HYSTERECTOMY    . TUBAL LIGATION      OB History   No obstetric history on file.      Home Medications    Prior to Admission medications   Medication Sig Start Date End Date Taking? Authorizing Provider  albuterol (PROVENTIL) (2.5 MG/3ML)  0.083% nebulizer solution Take 3 mLs (2.5 mg total) by nebulization every 6 (six) hours as needed for wheezing or shortness of breath. 04/04/20   Truddie Hidden, MD  albuterol (VENTOLIN HFA) 108 (90 Base) MCG/ACT inhaler Inhale 2 puffs into the lungs every 6 (six) hours as needed for wheezing or shortness of breath. Patient not taking: Reported on 04/04/2020    [provider]  albuterol (VENTOLIN HFA) 108 (90 Base) MCG/ACT inhaler Inhale 2 puffs into the lungs every 4 (four) hours as needed for wheezing or shortness of breath. 02/10/20   Margarita Mail, PA-C  blood glucose meter kit and supplies Dispense based on patient and insurance preference. Use up to four times daily as directed. (FOR ICD-10 E10.9, E11.9). 05/13/19   Azzie Glatter, FNP  Budesonide (PULMICORT FLEXHALER) 90 MCG/ACT inhaler Inhale 1 puff into the lungs 2 (two) times daily. Rinse mouth with water and spit after every use. 04/04/20   Truddie Hidden, MD  levETIRAcetam (KEPPRA) 500 MG tablet Take 2 tablets (1,000 mg total) by mouth 2 (two) times daily. 12/27/19   Ripley Fraise, MD  METFORMIN HCL PO Take 1 tablet by mouth 2 (two) times daily with a meal.    [provider]  predniSONE (DELTASONE) 20 MG tablet Take 1 tablet (20 mg total) by mouth 2 (two) times daily with a meal. 04/04/20   Raylene Everts,  MD    Family History Family History  Problem Relation Age of Onset  . Seizures Daughter   . Asthma Son   . Hypertension Father   . Arrhythmia Father        Status post pacemaker insertion  . Hypertension Mother   . Diabetes Mother     Social History Social History   Tobacco Use  . Smoking status: Never Smoker  . Smokeless tobacco: Never Used  Vaping Use  . Vaping Use: Never used  Substance Use Topics  . Alcohol use: Yes    Alcohol/week: 0.0 standard drinks    Comment: Occasional social  . Drug use: No     Allergies   Tramadol and Acetaminophen   Review of Systems Review of Systems  Chest pain See HPI  Physical Exam Triage Vital Signs ED Triage Vitals  Enc Vitals Group     BP 04/04/20 1732 131/86     Pulse Rate 04/04/20 1732 78     Resp 04/04/20 1732 18     Temp 04/04/20 1732 98.4 F (36.9 C)     Temp Source 04/04/20 1732 Oral     SpO2 04/04/20 1732 100 %     Weight --      Height --      Head Circumference --      Peak Flow --      Pain Score 04/04/20 1739 9     Pain Loc --      Pain Edu? --      Excl. in [place]? --    No data found.  Updated Vital Signs BP 131/86 (BP Location: Left Arm)   Pulse 78   Temp 98.4 F (36.9 C) (Oral)   Resp 18   SpO2 100%     Physical Exam Constitutional:      General: She is not in acute distress.    Appearance: She is well-developed.     Comments: Patient is crying.  Both arms wrapped in her chest.  Complaining of "severe" pain  HENT:     Head: Normocephalic and atraumatic.     Mouth/Throat:     Comments: Mask is in place Eyes:     Conjunctiva/sclera: Conjunctivae normal.     Pupils: Pupils are equal, round, and reactive to light.  Cardiovascular:     Rate and Rhythm: Normal rate and regular rhythm.     Heart sounds: Normal heart sounds.     Comments: Pulse is regular Pulmonary:     Effort: Pulmonary effort is normal. No respiratory distress.     Breath sounds: Normal breath sounds.     Comments: Lungs are clear.  Moderate tenderness to palpation of the anterior rib cage at the right side, 2nd-3rd rib region.  No crepitus.  No deformity Chest:     Chest wall: Tenderness present.  Musculoskeletal:        General: Normal range of motion.     Cervical back: Normal range of motion.  Skin:    General: Skin is warm and dry.  Neurological:     Mental Status: She is alert.  Psychiatric:        Mood and Affect: Mood normal.        Behavior: Behavior normal.     Comments: After I told patient I was going to give her a hydrocodone for pain, went back in the room and she was sitting comfortably, breathing  comfortably, texting on her telephone.      UC Treatments /  Results  Labs (all labs ordered are listed, but only abnormal results are displayed) Labs Reviewed - No data to display  EKG   Radiology DG Chest 2 View  Result Date: 04/04/2020 CLINICAL DATA:  Shortness of breath and cough EXAM: CHEST - 2 VIEW COMPARISON:  December 27, 2019 FINDINGS: The heart size and mediastinal contours are within normal limits. Both lungs are clear. The visualized skeletal structures are unremarkable. IMPRESSION: No active cardiopulmonary disease. Electronically Signed   By: Abelardo Diesel M.D.   On: 04/04/2020 08:54   ER notes reviewed. Chest x-ray reviewed. Procedures Procedures (including critical care time)  Medications Ordered in UC Medications  HYDROcodone-acetaminophen (NORCO/VICODIN) 5-325 MG per tablet 1 tablet (1 tablet Oral Given 04/04/20 1759)  diphenhydrAMINE (BENADRYL) capsule 25 mg (25 mg Oral Given 04/04/20 1759)  methylPREDNISolone sodium succinate (SOLU-MEDROL) 125 mg/2 mL injection 80 mg (80 mg Intramuscular Given 04/04/20 1759)    Initial Impression / Assessment and Plan / UC Course  I have reviewed the triage vital signs and the nursing notes.  Pertinent labs & imaging results that were available during my care of the patient were reviewed by me and considered in my medical decision making (see chart for details).     Because patient lists an allergy to hydrocodone with itching I am giving her Benadryl with 1 hydrocodone. I am going to send her home with a prescription for prednisone 20 twice a day Final Clinical Impressions(s) / UC Diagnoses   Final diagnoses:  Acute chest wall pain     Discharge Instructions     Take prednisone 2 times a day Start the prednisone tomorrow Continue using your inhaler Drink plenty of fluids You may take Advil (ibuprofen) or Aleve (naproxen)for pain.  These are anti-inflammatory pain medications that help with chest wall pain and strains of  the ribs   ED Prescriptions    Medication Sig Dispense Auth. Provider   predniSONE (DELTASONE) 20 MG tablet Take 1 tablet (20 mg total) by mouth 2 (two) times daily with a meal. 10 tablet Raylene Everts, MD     PDMP not reviewed this encounter.   Raylene Everts, MD 04/05/20 [phone]

## 2020-04-12 ENCOUNTER — Emergency Department (HOSPITAL_COMMUNITY)
Admission: EM | Admit: 2020-04-12 | Discharge: 2020-04-13 | Disposition: A | Discharge status: Home / Self Care | Payer: Medicaid Other | Attending: Emergency Medicine | Admitting: Emergency Medicine

## 2020-04-12 ENCOUNTER — Encounter (HOSPITAL_COMMUNITY): Payer: Self-pay

## 2020-04-12 DIAGNOSIS — Z79899 Other long term (current) drug therapy: Secondary | ICD-10-CM | POA: Insufficient documentation

## 2020-04-12 DIAGNOSIS — E119 Type 2 diabetes mellitus without complications: Secondary | ICD-10-CM | POA: Insufficient documentation

## 2020-04-12 DIAGNOSIS — R569 Unspecified convulsions: Secondary | ICD-10-CM

## 2020-04-12 DIAGNOSIS — Z7984 Long term (current) use of oral hypoglycemic drugs: Secondary | ICD-10-CM | POA: Diagnosis not present

## 2020-04-12 DIAGNOSIS — G40909 Epilepsy, unspecified, not intractable, without status epilepticus: Secondary | ICD-10-CM | POA: Insufficient documentation

## 2020-04-12 DIAGNOSIS — Z9114 Patient's other noncompliance with medication regimen: Secondary | ICD-10-CM | POA: Insufficient documentation

## 2020-04-12 DIAGNOSIS — R4189 Other symptoms and signs involving cognitive functions and awareness: Secondary | ICD-10-CM | POA: Insufficient documentation

## 2020-04-12 DIAGNOSIS — R4182 Altered mental status, unspecified: Secondary | ICD-10-CM | POA: Diagnosis present

## 2020-04-12 LAB — RAPID URINE DRUG SCREEN, HOSP PERFORMED
Amphetamines: NOT DETECTED
Barbiturates: NOT DETECTED
Benzodiazepines: NOT DETECTED
Cocaine: POSITIVE — AB
Opiates: NOT DETECTED
Tetrahydrocannabinol: NOT DETECTED

## 2020-04-12 LAB — URINALYSIS, ROUTINE W REFLEX MICROSCOPIC
Bacteria, UA: NONE SEEN
Bilirubin Urine: NEGATIVE
Glucose, UA: NEGATIVE mg/dL
Ketones, ur: NEGATIVE mg/dL
Leukocytes,Ua: NEGATIVE
Nitrite: NEGATIVE
Protein, ur: NEGATIVE mg/dL
Specific Gravity, Urine: 1.005 (ref 1.005–1.030)
pH: 5 (ref 5.0–8.0)

## 2020-04-12 LAB — COMPREHENSIVE METABOLIC PANEL
ALT: 21 U/L (ref 0–44)
AST: 24 U/L (ref 15–41)
Albumin: 4.3 g/dL (ref 3.5–5.0)
Alkaline Phosphatase: 58 U/L (ref 38–126)
Anion gap: 9 (ref 5–15)
BUN: 10 mg/dL (ref 6–20)
CO2: 22 mmol/L (ref 22–32)
Calcium: 9.3 mg/dL (ref 8.9–10.3)
Chloride: 105 mmol/L (ref 98–111)
Creatinine, Ser: 0.91 mg/dL (ref 0.44–1.00)
GFR calc Af Amer: >60 mL/min (ref >60)
GFR calc non Af Amer: >60 mL/min (ref >60)
Glucose, Bld: 104 mg/dL — ABNORMAL HIGH (ref 70–99)
Potassium: 4 mmol/L (ref 3.5–5.1)
Sodium: 136 mmol/L (ref 135–145)
Total Bilirubin: 0.7 mg/dL (ref 0.3–1.2)
Total Protein: 7.8 g/dL (ref 6.5–8.1)

## 2020-04-12 LAB — CBC
HCT: 39.7 % (ref 36.0–46.0)
Hemoglobin: 13.1 g/dL (ref 12.0–15.0)
MCH: 30.8 pg (ref 26.0–34.0)
MCHC: 33 g/dL (ref 30.0–36.0)
MCV: 93.4 fL (ref 80.0–100.0)
Platelets: 187 10*3/uL (ref 150–400)
RBC: 4.25 MIL/uL (ref 3.87–5.11)
RDW: 13.1 % (ref 11.5–15.5)
WBC: 6.7 10*3/uL (ref 4.0–10.5)
nRBC: 0 % (ref 0.0–0.2)

## 2020-04-12 MED ORDER — LEVETIRACETAM IN NACL 1000 MG/100ML IV SOLN
1000.0000 mg | Freq: Once | INTRAVENOUS | Status: AC
  Administered 2020-04-12: 1000 mg via INTRAVENOUS
  Filled 2020-04-12: qty 100

## 2020-04-12 MED ORDER — MIDAZOLAM HCL 2 MG/2ML IJ SOLN
INTRAMUSCULAR | Status: AC
  Filled 2020-04-12: qty 2

## 2020-04-12 MED ORDER — MIDAZOLAM HCL 2 MG/2ML IJ SOLN
2.0000 mg | Freq: Once | INTRAMUSCULAR | Status: AC
  Administered 2020-04-12: 2 mg via INTRAVENOUS

## 2020-04-12 MED ORDER — LORAZEPAM 2 MG/ML IJ SOLN
1.0000 mg | Freq: Once | INTRAMUSCULAR | Status: AC
  Administered 2020-04-12: 1 mg via INTRAVENOUS
  Filled 2020-04-12: qty 1

## 2020-04-12 NOTE — ED Notes (Signed)
RN placed pt on 10L o2 via NRB d/t the medication.

## 2020-04-12 NOTE — ED Notes (Signed)
Pt is not corporative and RN was given verbal orders to give 2mg  IV Versed.

## 2020-04-12 NOTE — ED Notes (Signed)
Pt disconnected all monitoring devices. Pt reconnected.

## 2020-04-12 NOTE — ED Notes (Signed)
Pt is having another seizure, MD at bedside.

## 2020-04-12 NOTE — ED Triage Notes (Addendum)
Pt came in [facility] from her place of work. Pt was found unresponsive in her vehicle. Bystanders witnessed a seizure like activity. Pt does have hx of seizures and takes keppra. Pt deniers ay recent seizures. Pt husband does not know she has seizures and pt wants to keep it that way. Pt is A/Ox0. Pt had a miscarriage this morning. Pt is not wanting to be seen and refusing care, however, not a/o. Pt did state she might have had Alcohol.

## 2020-04-12 NOTE — ED Notes (Signed)
Pt had a witnessed seizure by EMT, RN and MD at bedside.

## 2020-04-12 NOTE — ED Notes (Signed)
Pt closed her eyes and started to shake her head then began shaking all over.  This RN stated that this did not look like a seizure.  Pt immediately stopped shaking, opened her eyes and said "this is a seizure"!!!!  MD made aware

## 2020-04-12 NOTE — ED Provider Notes (Signed)
[facility] EMERGENCY DEPARTMENT Provider Note   CSN: [id number] Arrival date & time: 04/12/20  2103     History No chief complaint on file.   Jennifer Crawford is a 44 y.o. female via EMS found unresponsive in vehicle.  Bystanders witnessed seizure-like activity.  Chart review does show history of seizures and medication noncompliance with home Keppra.  Patient denies recent seizures and states she does not want husband to know that she has seizures.  Patient does endorse alcohol use PTA.  On arrival patient is disoriented, appears intoxicated and is attempting to leave the ED.    The history is provided by the patient, the EMS personnel and medical records. The history is limited by the condition of the patient.       Past Medical History:  Diagnosis Date  . Anxiety and depression   . Diabetes mellitus without complication ([place])   . Right flank pain 05/2019  . Right upper quadrant pain 8/22020  . Seizures ([place]) 2015  . Vitamin D deficiency 05/2019    Patient Active Problem List   Diagnosis Date Noted  . Hypoglycemia 06/27/2019  . Hematuria 06/27/2019  . Low back pain with sciatica 05/31/2019  . Left flank pain 05/31/2019  . Intractable headache 05/13/2019  . Right upper quadrant pain 05/13/2019  . Right flank pain 05/13/2019  . Diabetes mellitus type 2 in nonobese ([place]) 06/20/2015  . Elevated blood alcohol level 06/20/2015  . Anxiety and depression 06/20/2015  . Seizure disorder ([place]) 06/19/2015    Past Surgical History:  Procedure Laterality Date  . ABDOMINAL HYSTERECTOMY    . TUBAL LIGATION       OB History   No obstetric history on file.     Family History  Problem Relation Age of Onset  . Seizures Daughter   . Asthma Son   . Hypertension Father   . Arrhythmia Father        Status post pacemaker insertion  . Hypertension Mother   . Diabetes Mother     Social History   Tobacco Use  . Smoking status: Never Smoker  .  Smokeless tobacco: Never Used  Vaping Use  . Vaping Use: Never used  Substance Use Topics  . Alcohol use: Yes    Alcohol/week: 0.0 standard drinks    Comment: Occasional social  . Drug use: No    Home Medications Prior to Admission medications   Medication Sig Start Date End Date Taking? Authorizing Provider  albuterol (PROVENTIL) (2.5 MG/3ML) 0.083% nebulizer solution Take 3 mLs (2.5 mg total) by nebulization every 6 (six) hours as needed for wheezing or shortness of breath. 04/04/20   Truddie Hidden, MD  albuterol (VENTOLIN HFA) 108 (90 Base) MCG/ACT inhaler Inhale 2 puffs into the lungs every 6 (six) hours as needed for wheezing or shortness of breath. Patient not taking: Reported on 04/04/2020    [provider]  albuterol (VENTOLIN HFA) 108 (90 Base) MCG/ACT inhaler Inhale 2 puffs into the lungs every 4 (four) hours as needed for wheezing or shortness of breath. 02/10/20   Margarita Mail, PA-C  blood glucose meter kit and supplies Dispense based on patient and insurance preference. Use up to four times daily as directed. (FOR ICD-10 E10.9, E11.9). 05/13/19   Azzie Glatter, FNP  Budesonide (PULMICORT FLEXHALER) 90 MCG/ACT inhaler Inhale 1 puff into the lungs 2 (two) times daily. Rinse mouth with water and spit after every use. 04/04/20   Truddie Hidden, MD  levETIRAcetam (KEPPRA) 500 MG tablet Take 2 tablets (1,000 mg total) by mouth 2 (two) times daily. 12/27/19   Ripley Fraise, MD  METFORMIN HCL PO Take 1 tablet by mouth 2 (two) times daily with a meal.    [provider]  predniSONE (DELTASONE) 20 MG tablet Take 1 tablet (20 mg total) by mouth 2 (two) times daily with a meal. 04/04/20   Raylene Everts, MD    Allergies    Tramadol and Acetaminophen  Review of Systems   Review of Systems  Unable to perform ROS: Mental status change (Patient appears clinically intoxicated)    Physical Exam Updated Vital Signs BP 109/86   Pulse 98   Temp 98.5 F (36.9  C) (Temporal)   Resp 18   Ht _0  (1.854 m)   Wt 75 kg   SpO2 94%   BMI 21.81 kg/m   Physical Exam Vitals and nursing note reviewed.  Constitutional:      General: She is not in acute distress.    Appearance: Normal appearance. She is normal weight. She is not ill-appearing, toxic-appearing or diaphoretic.  HENT:     Head: Normocephalic and atraumatic.     Mouth/Throat:     Mouth: Mucous membranes are moist.  Eyes:     Extraocular Movements: Extraocular movements intact.     Conjunctiva/sclera: Conjunctivae normal.  Cardiovascular:     Rate and Rhythm: Normal rate and regular rhythm.     Heart sounds: Normal heart sounds. No murmur heard.   Pulmonary:     Effort: Pulmonary effort is normal. No respiratory distress.     Breath sounds: Normal breath sounds. No wheezing or rales.  Abdominal:     General: There is no distension.     Palpations: Abdomen is soft.     Tenderness: There is no abdominal tenderness. There is no guarding or rebound.  Musculoskeletal:     Right lower leg: No edema.     Left lower leg: No edema.  Skin:    General: Skin is warm.     Findings: No rash.  Neurological:     Mental Status: She is alert. She is disoriented.     Comments: Patient appears intoxicated.  Patient is disoriented and unable to provide even her name.  Psychiatric:        Behavior: Behavior is uncooperative and combative.        Cognition and Memory: Cognition is impaired.     ED Results / Procedures / Treatments   Labs (all labs ordered are listed, but only abnormal results are displayed) Labs Reviewed  COMPREHENSIVE METABOLIC PANEL - Abnormal; Notable for the following components:      Result Value   Glucose, Bld 104 (*)    All other components within normal limits  URINALYSIS, ROUTINE W REFLEX MICROSCOPIC - Abnormal; Notable for the following components:   Color, Urine STRAW (*)    Hgb urine dipstick MODERATE (*)    All other components within normal limits  RAPID  URINE DRUG SCREEN, HOSP PERFORMED - Abnormal; Notable for the following components:   Cocaine POSITIVE (*)    All other components within normal limits  ETHANOL - Abnormal; Notable for the following components:   Alcohol, Ethyl (B) 72 (*)    All other components within normal limits  ACETAMINOPHEN LEVEL - Abnormal; Notable for the following components:   Acetaminophen (Tylenol), Serum <10 (*)    All other components within normal limits  SALICYLATE LEVEL - Abnormal;  Notable for the following components:   Salicylate Lvl <8.7 (*)    All other components within normal limits  CBC  I-STAT BETA HCG BLOOD, ED ([facility], [facility], [facility] ONLY)    EKG EKG Interpretation  Date/Time:  Monday April 12 2020 21:39:55 EDT Ventricular Rate:  128 PR Interval:    QRS Duration: 81 QT Interval:  291 QTC Calculation: 425 R Axis:   72 Text Interpretation: Sinus tachycardia Probable LVH with secondary repol abnrm Non-specific ST-t changes Confirmed by Lajean Saver [id number]) on 04/12/2020 10:55:33 PM   Radiology No results found.  Procedures Procedures (including critical care time)  Medications Ordered in ED Medications  midazolam (VERSED) injection 2 mg (2 mg Intravenous Given 04/12/20 2121)  levETIRAcetam (KEPPRA) IVPB 1000 mg/100 mL premix (0 mg Intravenous Stopped 04/12/20 2149)  LORazepam (ATIVAN) injection 1 mg (1 mg Intravenous Given 04/12/20 2211)    ED Course  I have reviewed the triage vital signs and the nursing notes.  Pertinent labs & imaging results that were available during my care of the patient were reviewed by me and considered in my medical decision making (see chart for details).    MDM Rules/Calculators/A&P                           Patient is a 44 year old female with past medical history significant for Seizures, DM, Anxiety/Depression, substance use who presents via EMS for unresponsiveness.  Bystanders also noted patient to have seizure-like activity.  On arrival patient appears  clinically intoxicated, is unable to provide name, is not oriented to situation, place, date and is uncooperative and trying to leave.  Patient does not have capacity to make medical decisions at this time.  Patient given 2 of Versed for sedation in setting of patient attempting to leave.  Patient then initially cooperative with medical work-up.  During initial evaluation patient with several episodes of seizure-like activity including shaking of upper body.  No nystagmus present.  Patient not responding to verbal stimuli during these events.  Patient however without post ictal period following these events.  Evaluation by myself and supervising physician of multiple of these events appear more consistent with pseudoseizure versus seizure.  In the setting of known prior seizures and medication noncompliance per chart review loading dose Keppra 1g given.  Patient continued to be uncooperative and did not appear to have capacity and was given additional 1 mg Ativan.  On exam patient without focal findings.  No obvious signs of trauma.  Lungs clear to auscultation.  Abdomen soft nontender.  No lower extremity edema.  No overlying rashes.  Screening labs completed.  UA not consistent with UTI.  UDS was significant for cocaine which could be consistent with initial presentation/clinical intoxication.  Ethanol elevated at 72.  Acetaminophen/salicylate levels negative.  Blood glucose 104.  No significant abnormalities on CMP, CBC.  On reevaluation patient more alert, interacting more appropriately.  Patient states she would like to be discharged.  Husband now at bedside and states he would provide ride home and will keep an eye on patient.  Patient appears to have metabolized since presentation.  Will discharge patient to care of husband.  Recommended close follow-up with PCP in setting of ED evaluation.  Patient/husband in agreement with plan.  Patient discharged in stable condition.  Final Clinical Impression(s) / ED  Diagnoses Final diagnoses:  Seizure-like activity ([place])  Unresponsive episode    Rx / DC Orders ED Discharge Orders  None       Kennyth Lose, MD 04/13/20 0100    Lajean Saver, MD 04/13/20 [place]

## 2020-04-13 LAB — SALICYLATE LEVEL: Salicylate Lvl: <7 mg/dL — ABNORMAL LOW (ref 7.0–30.0)

## 2020-04-13 LAB — ACETAMINOPHEN LEVEL: Acetaminophen (Tylenol), Serum: <10 ug/mL — ABNORMAL LOW (ref 10–30)

## 2020-04-13 LAB — ETHANOL: Alcohol, Ethyl (B): 72 mg/dL — ABNORMAL HIGH (ref <10)

## 2020-04-25 ENCOUNTER — Emergency Department (HOSPITAL_COMMUNITY): Payer: Medicaid Other

## 2020-04-25 ENCOUNTER — Encounter (HOSPITAL_COMMUNITY): Payer: Self-pay

## 2020-04-25 ENCOUNTER — Other Ambulatory Visit: Payer: Self-pay

## 2020-04-25 ENCOUNTER — Emergency Department (HOSPITAL_COMMUNITY)
Admission: EM | Admit: 2020-04-25 | Discharge: 2020-04-25 | Disposition: A | Discharge status: Home / Self Care | Payer: Medicaid Other | Attending: Emergency Medicine | Admitting: Emergency Medicine

## 2020-04-25 DIAGNOSIS — E1165 Type 2 diabetes mellitus with hyperglycemia: Secondary | ICD-10-CM | POA: Diagnosis not present

## 2020-04-25 DIAGNOSIS — Z7984 Long term (current) use of oral hypoglycemic drugs: Secondary | ICD-10-CM | POA: Diagnosis not present

## 2020-04-25 DIAGNOSIS — R569 Unspecified convulsions: Secondary | ICD-10-CM | POA: Insufficient documentation

## 2020-04-25 DIAGNOSIS — Z7951 Long term (current) use of inhaled steroids: Secondary | ICD-10-CM | POA: Diagnosis not present

## 2020-04-25 LAB — CBC WITH DIFFERENTIAL/PLATELET
Abs Immature Granulocytes: 0.01 10*3/uL (ref 0.00–0.07)
Basophils Absolute: 0 10*3/uL (ref 0.0–0.1)
Basophils Relative: 1 %
Eosinophils Absolute: 0 10*3/uL (ref 0.0–0.5)
Eosinophils Relative: 0 %
HCT: 35.4 % — ABNORMAL LOW (ref 36.0–46.0)
Hemoglobin: 11.5 g/dL — ABNORMAL LOW (ref 12.0–15.0)
Immature Granulocytes: 0 %
Lymphocytes Relative: 41 %
Lymphs Abs: 1.9 10*3/uL (ref 0.7–4.0)
MCH: 30.8 pg (ref 26.0–34.0)
MCHC: 32.5 g/dL (ref 30.0–36.0)
MCV: 94.9 fL (ref 80.0–100.0)
Monocytes Absolute: 0.3 10*3/uL (ref 0.1–1.0)
Monocytes Relative: 6 %
Neutro Abs: 2.3 10*3/uL (ref 1.7–7.7)
Neutrophils Relative %: 52 %
Platelets: 154 10*3/uL (ref 150–400)
RBC: 3.73 MIL/uL — ABNORMAL LOW (ref 3.87–5.11)
RDW: 13.1 % (ref 11.5–15.5)
WBC: 4.5 10*3/uL (ref 4.0–10.5)
nRBC: 0 % (ref 0.0–0.2)

## 2020-04-25 LAB — RAPID URINE DRUG SCREEN, HOSP PERFORMED
Amphetamines: NOT DETECTED
Barbiturates: NOT DETECTED
Benzodiazepines: POSITIVE — AB
Cocaine: POSITIVE — AB
Opiates: NOT DETECTED
Tetrahydrocannabinol: NOT DETECTED

## 2020-04-25 LAB — URINALYSIS, ROUTINE W REFLEX MICROSCOPIC
Bacteria, UA: NONE SEEN
Bilirubin Urine: NEGATIVE
Glucose, UA: NEGATIVE mg/dL
Ketones, ur: NEGATIVE mg/dL
Nitrite: NEGATIVE
Protein, ur: NEGATIVE mg/dL
Specific Gravity, Urine: 1.01 (ref 1.005–1.030)
pH: 5 (ref 5.0–8.0)

## 2020-04-25 LAB — COMPREHENSIVE METABOLIC PANEL
ALT: 19 U/L (ref 0–44)
AST: 27 U/L (ref 15–41)
Albumin: 3.8 g/dL (ref 3.5–5.0)
Alkaline Phosphatase: 44 U/L (ref 38–126)
Anion gap: 9 (ref 5–15)
BUN: 10 mg/dL (ref 6–20)
CO2: 19 mmol/L — ABNORMAL LOW (ref 22–32)
Calcium: 8.7 mg/dL — ABNORMAL LOW (ref 8.9–10.3)
Chloride: 108 mmol/L (ref 98–111)
Creatinine, Ser: 0.85 mg/dL (ref 0.44–1.00)
GFR calc Af Amer: >60 mL/min (ref >60)
GFR calc non Af Amer: >60 mL/min (ref >60)
Glucose, Bld: 91 mg/dL (ref 70–99)
Potassium: 4.7 mmol/L (ref 3.5–5.1)
Sodium: 136 mmol/L (ref 135–145)
Total Bilirubin: 1 mg/dL (ref 0.3–1.2)
Total Protein: 6.8 g/dL (ref 6.5–8.1)

## 2020-04-25 LAB — ETHANOL: Alcohol, Ethyl (B): 132 mg/dL — ABNORMAL HIGH (ref <10)

## 2020-04-25 MED ORDER — SODIUM CHLORIDE 0.9 % IV BOLUS
1000.0000 mL | Freq: Once | INTRAVENOUS | Status: AC
  Administered 2020-04-25: 1000 mL via INTRAVENOUS

## 2020-04-25 MED ORDER — SODIUM CHLORIDE 0.9 % IV BOLUS (SEPSIS)
1000.0000 mL | Freq: Once | INTRAVENOUS | Status: AC
  Administered 2020-04-25: 1000 mL via INTRAVENOUS

## 2020-04-25 MED ORDER — LEVETIRACETAM 1000 MG PO TABS
1000.0000 mg | ORAL_TABLET | Freq: Two times a day (BID) | ORAL | 0 refills | Status: DC
Start: 2020-04-25 — End: 2021-02-02

## 2020-04-25 MED ORDER — LEVETIRACETAM IN NACL 1000 MG/100ML IV SOLN
1000.0000 mg | Freq: Once | INTRAVENOUS | Status: AC
  Administered 2020-04-25: 1000 mg via INTRAVENOUS
  Filled 2020-04-25: qty 100

## 2020-04-25 NOTE — ED Provider Notes (Signed)
[facility] EMERGENCY DEPARTMENT Provider Note   CSN: [id number] Arrival date & time: 04/25/20  0158     History Chief Complaint  Patient presents with  . Seizures    Jennifer Crawford is a 44 y.o. female.  The history is provided by the spouse, medical records and the EMS personnel.  Seizures   LEVEL V CAVEAT:  SEIZURE, POST-ICTAL, GIVEN VERSED  45 y.o. F with hx of anxiety, depression, DM, seizures, presenting to the ED after witnessed seizure at home.  Husband reported full tonic/clonic seizure at home in the bed, lasted approx 15-20 seconds.  EMS was called and witnessed a few other episodes and was given 11m versed.  Patient apparently was re-started on keppra 1 week after after 2 years without medications.  She did not take her home meds today per husband but has been drinking alcohol.  Patient somnolent and snoring upon assessment here.  Past Medical History:  Diagnosis Date  . Anxiety and depression   . Diabetes mellitus without complication ([facility]   . Right flank pain 05/2019  . Right upper quadrant pain 8/22020  . Seizures ([facility]  . Vitamin D deficiency 05/2019    Patient Active Problem List   Diagnosis Date Noted  . Hypoglycemia 06/27/2019  . Hematuria 06/27/2019  . Low back pain with sciatica 05/31/2019  . Left flank pain 05/31/2019  . Intractable headache 05/13/2019  . Right upper quadrant pain 05/13/2019  . Right flank pain 05/13/2019  . Diabetes mellitus type 2 in nonobese ([place] 06/20/2015  . Elevated blood alcohol level 06/20/2015  . Anxiety and depression 06/20/2015  . [place]    Past Surgical History:  Procedure Laterality Date  . ABDOMINAL HYSTERECTOMY    . TUBAL LIGATION       OB History   No obstetric history on file.     Family History  Problem Relation Age of Onset  . Seizures Daughter   . Asthma Son   . Hypertension Father   . Arrhythmia Father        Status post pacemaker  insertion  . Hypertension Mother   . Diabetes Mother     Social History   Tobacco Use  . Smoking status: Never Smoker  . Smokeless tobacco: Never Used  Vaping Use  . Vaping Use: Never used  Substance Use Topics  . Alcohol use: Yes    Alcohol/week: 0.0 standard drinks    Comment: Occasional social  . Drug use: No    Home Medications Prior to Admission medications   Medication Sig Start Date End Date Taking? Authorizing Provider  albuterol (PROVENTIL) (2.5 MG/3ML) 0.083% nebulizer solution Take 3 mLs (2.5 mg total) by nebulization every 6 (six) hours as needed for wheezing or shortness of breath. 04/04/20   STruddie Hidden MD  albuterol (VENTOLIN HFA) 108 (90 Base) MCG/ACT inhaler Inhale 2 puffs into the lungs every 6 (six) hours as needed for wheezing or shortness of breath. Patient not taking: Reported on 04/04/2020    [provider]  albuterol (VENTOLIN HFA) 108 (90 Base) MCG/ACT inhaler Inhale 2 puffs into the lungs every 4 (four) hours as needed for wheezing or shortness of breath. 02/10/20   HMargarita Mail PA-C  blood glucose meter kit and supplies Dispense based on patient and insurance preference. Use up to four times daily as directed. (FOR ICD-10 E10.9, E11.9). 05/13/19   SAzzie Glatter FNP  Budesonide (PULMICORT FLEXHALER) 90 MCG/ACT inhaler Inhale 1 puff  into the lungs 2 (two) times daily. Rinse mouth with water and spit after every use. 04/04/20   Truddie Hidden, MD  levETIRAcetam (KEPPRA) 500 MG tablet Take 2 tablets (1,000 mg total) by mouth 2 (two) times daily. 12/27/19   Ripley Fraise, MD  METFORMIN HCL PO Take 1 tablet by mouth 2 (two) times daily with a meal.    [provider]  predniSONE (DELTASONE) 20 MG tablet Take 1 tablet (20 mg total) by mouth 2 (two) times daily with a meal. 04/04/20   Raylene Everts, MD    Allergies    Tramadol and Acetaminophen  Review of Systems   Review of Systems  Unable to perform ROS: Other     Physical Exam Updated Vital Signs BP 107/67 (BP Location: Left Arm)   Pulse 93   Temp 98.2 F (36.8 C) (Oral)   Resp 23   SpO2 99%   Physical Exam Vitals and nursing note reviewed.  Constitutional:      Appearance: She is well-developed.     Comments: snoring  HENT:     Head: Normocephalic and atraumatic.     Comments: Head atraumatic    Mouth/Throat:     Comments: No oral trauma noted Eyes:     Conjunctiva/sclera: Conjunctivae normal.     Pupils: Pupils are equal, round, and reactive to light.  Cardiovascular:     Rate and Rhythm: Normal rate and regular rhythm.     Heart sounds: Normal heart sounds.  Pulmonary:     Effort: Pulmonary effort is normal. No respiratory distress.     Breath sounds: Normal breath sounds. No rhonchi.  Abdominal:     General: Bowel sounds are normal.     Palpations: Abdomen is soft.     Tenderness: There is no guarding.     Hernia: No hernia is present.  Musculoskeletal:        General: Normal range of motion.     Cervical back: Normal range of motion.  Skin:    General: Skin is warm and dry.  Neurological:     Comments: Snoring, limited movement observed (versed PTA), no seizure activity observed     ED Results / Procedures / Treatments   Labs (all labs ordered are listed, but only abnormal results are displayed) Labs Reviewed  CBC WITH DIFFERENTIAL/PLATELET - Abnormal; Notable for the following components:      Result Value   RBC 3.73 (*)    Hemoglobin 11.5 (*)    HCT 35.4 (*)    All other components within normal limits  COMPREHENSIVE METABOLIC PANEL - Abnormal; Notable for the following components:   CO2 19 (*)    Calcium 8.7 (*)    All other components within normal limits  ETHANOL - Abnormal; Notable for the following components:   Alcohol, Ethyl (B) 132 (*)    All other components within normal limits  RAPID URINE DRUG SCREEN, HOSP PERFORMED  I-STAT BETA HCG BLOOD, ED ([facility], [facility], [facility] ONLY)    EKG None  Radiology No  results found.  Procedures Procedures (including critical care time)  Medications Ordered in ED Medications  levETIRAcetam (KEPPRA) IVPB 1000 mg/100 mL premix (has no administration in time range)    ED Course  I have reviewed the triage vital signs and the nursing notes.  Pertinent labs & imaging results that were available during my care of the patient were reviewed by me and considered in my medical decision making (see chart for details).  MDM Rules/Calculators/A&P  44 year old female presenting to the ED with seizure activity prior to arrival.  Sounds like she had several episodes of 15 to 20 seconds seizures.  Has history of the same and has been noncompliant with her Keppra.  She is also been drinking alcohol today per her husband.  On arrival to ED, she is somnolent and snoring snoring.  She was given 25m Versed en route.  She is hemodynamically stable.  She does not have any visible signs of trauma to her head or face.  We will plan for screening labs.  Given IV load of Keppra.  Labs grossly reassuring.  Ethanol 132.  Patient BP did drop a bit into the 849'Ysystolic but improved back to 100's with IVF.  Patient remains somnolent, continues snoring.  Will continue to monitor.  6:07 AM Patient has starting to roll over in bed but still very drowsy when spoken to.  no further seizure activity observed. Will need to metabolize until fully back to baseline and then can likely discharge with close OP neurology follow-up.  Suspect break through seizures due to EtOH and medication non-compliance.  6:39 AM Care signed out to oncoming provider. Anticipate discharge once back to baseline.  Refill of keppra sent to her pharmacy already.  Final Clinical Impression(s) / ED Diagnoses Final diagnoses:  Seizure-like activity ([facility]    Rx / DLesharaOrders ED Discharge Orders    None       SLarene Pickett PA-C 04/25/20 [id number]   POrpah Greek MD 04/25/20 [phone]

## 2020-04-25 NOTE — ED Triage Notes (Signed)
Pt BIB [facility] from home. Pt's husband woke up to pt having a full body seizure in the bed. EMS arrived and witnessed 3 more full body seizures that lasted 15-20 secs. EMS gave 10 mg versed. Pt is currently not alert due to versed. Per EMS pt has a hx of seizures. Pt was prescribed keppra but had stopped taking it for 2 years. Pt had a seizure one week ago and was started back on keppra. Pt's husbands states pt has not taking Keppra today and was also drinking alcohol.

## 2020-04-25 NOTE — ED Notes (Signed)
Patient waking up asking to go home.

## 2020-04-25 NOTE — ED Provider Notes (Addendum)
  Physical Exam  BP (!) 84/57   Pulse 70   Temp 98.2 F (36.8 C) (Oral)   Resp 15   SpO2 100%   Physical Exam Vitals and nursing note reviewed.  Constitutional:      Appearance: Normal appearance.  HENT:     Head: Normocephalic.  Eyes:     Conjunctiva/sclera: Conjunctivae normal.  Pulmonary:     Effort: Pulmonary effort is normal.  Skin:    General: Skin is dry.  Neurological:     General: No focal deficit present.     Mental Status: She is alert.     Cranial Nerves: No cranial nerve deficit.     Sensory: No sensory deficit.     Gait: Gait normal.  Psychiatric:        Mood and Affect: Mood normal.     ED Course/Procedures   Clinical Course as of Apr 25 1509  Sun Apr 25, 2020  4496 Patient remains sleepy. She apparently got high dose of  versed via EMS. She is sleeping with a low BP when I enter the room. She is easily awakened. Will administer additional fluid bolus and continue to monitor.    [KM]  [phone] Patient is reporting that she has a headache.  She is keeping her eyes to close and will nod to some questions but not all questions.  She also occasionally shivers in bed. Will obtain head CT. She has not given urine sample yet   [KM]  0815 This is a left the room that patient woke up with a nurse and is alert and oriented and asking to go home.  She has given for urine sample   [KM]  0938 Head CT was normal.  Her UDS was significant for cocaine as well. Patient is alert and oriented and ok for d/c   [KM]    Clinical Course User Index [KM] Arlyn Dunning, PA-C    Procedures  MDM  Care assumed from Sharilyn Sites PA due to change of shift. Please see her note for full HPI and plan. Briefly, 44 y/o female with suspect break through seizures due to EtOH and medication non-compliance. Has had keppra load here and is drowsy but coming around. Patient initial EToH level 132. Patient ready for d/c and neurology f/u when she is more alert.    CRITICAL CARE Performed  by: Arlyn Dunning   Total critical care time:35 minutes  Critical care time was exclusive of separately billable procedures and treating other patients.  Critical care was necessary to treat or prevent imminent or life-threatening deterioration.  Critical care was time spent personally by me on the following activities: development of treatment plan with patient and/or surrogate as well as nursing, discussions with consultants, evaluation of patient's response to treatment, examination of patient, obtaining history from patient or surrogate, ordering and performing treatments and interventions, ordering and review of laboratory studies, ordering and review of radiographic studies, pulse oximetry and re-evaluation of patient's condition.    Jeral Pinch 04/25/20 1510    Little, Ambrose Finland, MD 04/25/20 1557    Arlyn Dunning, PA-C 05/10/20 1723    Little, Ambrose Finland, MD 05/10/20 [phone]

## 2020-04-25 NOTE — ED Notes (Signed)
Patient is asleep.  

## 2020-04-25 NOTE — Discharge Instructions (Signed)
We recommend that you follow-up closely with neurology. Make sure to take your keppra as prescribed, try not to miss doses.   I would avoid alcohol and any illicit substances. Return here for new concerns.

## 2020-05-02 ENCOUNTER — Ambulatory Visit (HOSPITAL_COMMUNITY)
Admission: EM | Admit: 2020-05-02 | Discharge: 2020-05-02 | Disposition: A | Discharge status: Other Acute Inpatient Hospital | Payer: Medicaid Other | Attending: Family Medicine | Admitting: Family Medicine

## 2020-05-02 ENCOUNTER — Emergency Department (HOSPITAL_COMMUNITY): Payer: Medicaid Other

## 2020-05-02 ENCOUNTER — Other Ambulatory Visit: Payer: Self-pay

## 2020-05-02 ENCOUNTER — Encounter (HOSPITAL_COMMUNITY): Payer: Self-pay

## 2020-05-02 ENCOUNTER — Emergency Department (HOSPITAL_COMMUNITY)
Admission: EM | Admit: 2020-05-02 | Discharge: 2020-05-03 | Disposition: A | Discharge status: Home / Self Care | Payer: Medicaid Other | Attending: Emergency Medicine | Admitting: Emergency Medicine

## 2020-05-02 DIAGNOSIS — Z5321 Procedure and treatment not carried out due to patient leaving prior to being seen by health care provider: Secondary | ICD-10-CM | POA: Diagnosis not present

## 2020-05-02 DIAGNOSIS — R519 Headache, unspecified: Secondary | ICD-10-CM | POA: Diagnosis not present

## 2020-05-02 DIAGNOSIS — R569 Unspecified convulsions: Secondary | ICD-10-CM

## 2020-05-02 NOTE — ED Triage Notes (Signed)
Pt arrives POV for eval of headache after having suspected seizure 2 days PTA. Pt reports hx of seizures, compliant w/ lamictal. Pt states she was home alone at the time, but endorses ongoing head pain. Here from [facility] for eval

## 2020-05-02 NOTE — ED Provider Notes (Signed)
[place]    CSN: [id number] Arrival date & time: 05/02/20  1622      History   Chief Complaint Chief Complaint  Patient presents with  . Headache  . Seizures    HPI Jennifer Crawford is a 44 y.o. female.   She is presenting with severe headaches following a self-reported seizure.  She has history of seizures.  She reports compliance with her medication.  She denies any illicit substances.  The most recent seizure was unwitnessed.  She is unclear and unsure if she hit her head.  She has had changes in her vision with double vision.  Pain is been uncontrolled.  She has neck pain that radiates proximally.  HPI  Past Medical History:  Diagnosis Date  . Anxiety and depression   . Diabetes mellitus without complication ([place])   . Right flank pain 05/2019  . Right upper quadrant pain 8/22020  . Seizures ([place]) 2015  . Vitamin D deficiency 05/2019    Patient Active Problem List   Diagnosis Date Noted  . Hypoglycemia 06/27/2019  . Hematuria 06/27/2019  . Low back pain with sciatica 05/31/2019  . Left flank pain 05/31/2019  . Intractable headache 05/13/2019  . Right upper quadrant pain 05/13/2019  . Right flank pain 05/13/2019  . Diabetes mellitus type 2 in nonobese ([place]) 06/20/2015  . Elevated blood alcohol level 06/20/2015  . Anxiety and depression 06/20/2015  . Seizure disorder ([place]) 06/19/2015    Past Surgical History:  Procedure Laterality Date  . ABDOMINAL HYSTERECTOMY    . TUBAL LIGATION      OB History   No obstetric history on file.      Home Medications    Prior to Admission medications   Medication Sig Start Date End Date Taking? Authorizing Provider  albuterol (PROVENTIL) (2.5 MG/3ML) 0.083% nebulizer solution Take 3 mLs (2.5 mg total) by nebulization every 6 (six) hours as needed for wheezing or shortness of breath. 04/04/20   Truddie Hidden, MD  albuterol (VENTOLIN HFA) 108 (90 Base) MCG/ACT inhaler Inhale 2 puffs into the lungs  every 6 (six) hours as needed for wheezing or shortness of breath. Patient not taking: Reported on 04/04/2020    [provider]  albuterol (VENTOLIN HFA) 108 (90 Base) MCG/ACT inhaler Inhale 2 puffs into the lungs every 4 (four) hours as needed for wheezing or shortness of breath. 02/10/20   Margarita Mail, PA-C  blood glucose meter kit and supplies Dispense based on patient and insurance preference. Use up to four times daily as directed. (FOR ICD-10 E10.9, E11.9). 05/13/19   Azzie Glatter, FNP  Budesonide (PULMICORT FLEXHALER) 90 MCG/ACT inhaler Inhale 1 puff into the lungs 2 (two) times daily. Rinse mouth with water and spit after every use. 04/04/20   Truddie Hidden, MD  levETIRAcetam (KEPPRA) 1000 MG tablet Take 1 tablet (1,000 mg total) by mouth 2 (two) times daily. 04/25/20   Larene Pickett, PA-C  METFORMIN HCL PO Take 1 tablet by mouth 2 (two) times daily with a meal.    [provider]  predniSONE (DELTASONE) 20 MG tablet Take 1 tablet (20 mg total) by mouth 2 (two) times daily with a meal. 04/04/20   Raylene Everts, MD    Family History Family History  Problem Relation Age of Onset  . Seizures Daughter   . Asthma Son   . Hypertension Father   . Arrhythmia Father        Status post pacemaker  insertion  . Hypertension Mother   . Diabetes Mother     Social History Social History   Tobacco Use  . Smoking status: Never Smoker  . Smokeless tobacco: Never Used  Vaping Use  . Vaping Use: Never used  Substance Use Topics  . Alcohol use: Yes    Alcohol/week: 0.0 standard drinks    Comment: Occasional social  . Drug use: No     Allergies   Tramadol and Acetaminophen   Review of Systems Review of Systems  See HPI  Physical Exam Triage Vital Signs ED Triage Vitals  Enc Vitals Group     BP 05/02/20 1717 (!) 142/97     Pulse Rate 05/02/20 1717 83     Resp 05/02/20 1717 18     Temp 05/02/20 1717 98.2 F (36.8 C)     Temp src --      SpO2  05/02/20 1717 97 %     Weight --      Height --      Head Circumference --      Peak Flow --      Pain Score 05/02/20 1715 10     Pain Loc --      Pain Edu? --      Excl. in [place]? --    No data found.  Updated Vital Signs BP (!) 142/97   Pulse 83   Temp 98.2 F (36.8 C)   Resp 18   SpO2 97%   Visual Acuity Right Eye Distance:   Left Eye Distance:   Bilateral Distance:    Right Eye Near:   Left Eye Near:    Bilateral Near:     Physical Exam Gen: NAD, alert, cooperative with exam, ENT: normal lips, normal nasal mucosa,  Eye: normal EOM, normal conjunctiva and lids  Resp: no accessory muscle use, non-labored,  Skin: no rashes, no areas of induration  Neuro: normal tone, normal sensation to touch Psych:  normal insight, alert and oriented     UC Treatments / Results  Labs (all labs ordered are listed, but only abnormal results are displayed) Labs Reviewed - No data to display  EKG   Radiology No results found.  Procedures Procedures (including critical care time)  Medications Ordered in UC Medications - No data to display  Initial Impression / Assessment and Plan / UC Course  I have reviewed the triage vital signs and the nursing notes.  Pertinent labs & imaging results that were available during my care of the patient were reviewed by me and considered in my medical decision making (see chart for details).     [name] is a 44 year old female that is presenting with a self-reported seizure and now ongoing neck pain, headaches and blurry vision.  She has a history of pseudoseizures and substance abuse.  Advised to be seen in the emergency department for the consideration of further work-up with lab work or imaging.  Final Clinical Impressions(s) / UC Diagnoses   Final diagnoses:  Acute nonintractable headache, unspecified headache type  Seizures [facility])     Discharge Instructions     Please be seen in the emergency department for further work up  for ongoing headache and possible seizure.     ED Prescriptions    None     PDMP not reviewed this encounter.   Rosemarie Ax, MD 05/02/20 [phone]

## 2020-05-02 NOTE — ED Notes (Signed)
No answer for VS x 2 

## 2020-05-02 NOTE — Discharge Instructions (Signed)
Please be seen in the emergency department for further work up for ongoing headache and possible seizure.

## 2020-05-02 NOTE — ED Triage Notes (Signed)
Patient reports headache x3 days and reports she had a seizure two days ago. Patient reports she was alone when seizure occurred and when she came out of her post-ictal state, she was on the ground. Unsure if she fell and hit her head or not.

## 2020-05-02 NOTE — ED Notes (Signed)
No answer for VS x1 

## 2020-05-25 ENCOUNTER — Other Ambulatory Visit: Payer: Self-pay

## 2020-05-25 ENCOUNTER — Encounter (HOSPITAL_COMMUNITY): Payer: Self-pay

## 2020-05-25 ENCOUNTER — Emergency Department (HOSPITAL_COMMUNITY)
Admission: EM | Admit: 2020-05-25 | Discharge: 2020-05-25 | Disposition: A | Discharge status: Home / Self Care | Payer: Medicaid Other | Attending: Emergency Medicine | Admitting: Emergency Medicine

## 2020-05-25 DIAGNOSIS — R251 Tremor, unspecified: Secondary | ICD-10-CM | POA: Insufficient documentation

## 2020-05-25 DIAGNOSIS — Z5321 Procedure and treatment not carried out due to patient leaving prior to being seen by health care provider: Secondary | ICD-10-CM | POA: Diagnosis not present

## 2020-05-25 NOTE — ED Triage Notes (Signed)
Pt arrives EMS after calling friend to bring her to hospital. EMS reports pt shaking but did not appear seizure like. Pt sts she missed her keppra.

## 2020-05-25 NOTE — ED Notes (Signed)
Pt ambulatory to the bathroom with steady gait and minimal assistance

## 2020-06-17 ENCOUNTER — Ambulatory Visit (HOSPITAL_COMMUNITY)
Admission: EM | Admit: 2020-06-17 | Discharge: 2020-06-17 | Disposition: A | Discharge status: Home / Self Care | Payer: Medicaid Other | Attending: Family Medicine | Admitting: Family Medicine

## 2020-06-17 ENCOUNTER — Emergency Department (HOSPITAL_COMMUNITY)
Admission: EM | Admit: 2020-06-17 | Discharge: 2020-06-17 | Disposition: A | Discharge status: Home / Self Care | Payer: Medicaid Other | Attending: Emergency Medicine | Admitting: Emergency Medicine

## 2020-06-17 ENCOUNTER — Encounter (HOSPITAL_COMMUNITY): Payer: Self-pay | Admitting: Emergency Medicine

## 2020-06-17 ENCOUNTER — Other Ambulatory Visit: Payer: Self-pay

## 2020-06-17 DIAGNOSIS — Z5321 Procedure and treatment not carried out due to patient leaving prior to being seen by health care provider: Secondary | ICD-10-CM | POA: Diagnosis not present

## 2020-06-17 DIAGNOSIS — R0600 Dyspnea, unspecified: Secondary | ICD-10-CM

## 2020-06-17 DIAGNOSIS — R21 Rash and other nonspecific skin eruption: Secondary | ICD-10-CM | POA: Insufficient documentation

## 2020-06-17 DIAGNOSIS — R300 Dysuria: Secondary | ICD-10-CM | POA: Insufficient documentation

## 2020-06-17 DIAGNOSIS — R350 Frequency of micturition: Secondary | ICD-10-CM | POA: Diagnosis not present

## 2020-06-17 LAB — URINALYSIS, ROUTINE W REFLEX MICROSCOPIC
Bacteria, UA: NONE SEEN
Bilirubin Urine: NEGATIVE
Glucose, UA: NEGATIVE mg/dL
Ketones, ur: NEGATIVE mg/dL
Nitrite: NEGATIVE
Protein, ur: NEGATIVE mg/dL
Specific Gravity, Urine: 1.026 (ref 1.005–1.030)
pH: 5 (ref 5.0–8.0)

## 2020-06-17 LAB — POCT URINALYSIS DIPSTICK, ED / UC
Bilirubin Urine: NEGATIVE
Glucose, UA: NEGATIVE mg/dL
Ketones, ur: NEGATIVE mg/dL
Leukocytes,Ua: NEGATIVE
Nitrite: NEGATIVE
Protein, ur: NEGATIVE mg/dL
Specific Gravity, Urine: >=1.03 (ref 1.005–1.030)
Urobilinogen, UA: 1 mg/dL (ref 0.0–1.0)
pH: 5.5 (ref 5.0–8.0)

## 2020-06-17 MED ORDER — CEPHALEXIN 500 MG PO CAPS: 500.0000 mg | ORAL_CAPSULE | Freq: Four times a day (QID) | ORAL | 0 refills | Status: AC

## 2020-06-17 NOTE — Discharge Instructions (Signed)
Your urine culture is pending 

## 2020-06-17 NOTE — ED Triage Notes (Signed)
Pt here with c/o uti type symptoms burning upon urination and frequency  Also has  A small rash to her right shoulder

## 2020-06-17 NOTE — ED Triage Notes (Signed)
Pt presents with dysuria and abdominal pain xs 2 days.   C/o of insect bite on bite on back. States happened last night at work. C/o itching, and redness.

## 2020-06-18 LAB — URINE CULTURE: Culture: <10000 — AB

## 2020-06-18 NOTE — ED Provider Notes (Signed)
[facility]    CSN: [id number] Arrival date & time: 06/17/20  1004      History   Chief Complaint Chief Complaint  Patient presents with  . Dysuria    HPI Jennifer Crawford is a 44 y.o. female.   The history is provided by the patient. No language interpreter was used.  Dysuria Pain quality:  Aching Pain severity:  Moderate Onset quality:  Gradual Timing:  Constant Progression:  Worsening Chronicity:  New Recent urinary tract infections: no   Relieved by:  Nothing Ineffective treatments:  None tried Urinary symptoms: foul-smelling urine     Past Medical History:  Diagnosis Date  . Anxiety and depression   . Diabetes mellitus without complication ([facility])   . Right flank pain 05/2019  . Right upper quadrant pain 8/22020  . Seizures ([place]) 2015  . Vitamin D deficiency 05/2019    Patient Active Problem List   Diagnosis Date Noted  . Hypoglycemia 06/27/2019  . Hematuria 06/27/2019  . Low back pain with sciatica 05/31/2019  . Left flank pain 05/31/2019  . Intractable headache 05/13/2019  . Right upper quadrant pain 05/13/2019  . Right flank pain 05/13/2019  . Diabetes mellitus type 2 in nonobese ([place]) 06/20/2015  . Elevated blood alcohol level 06/20/2015  . Anxiety and depression 06/20/2015  . Seizure disorder ([place]) 06/19/2015    Past Surgical History:  Procedure Laterality Date  . ABDOMINAL HYSTERECTOMY    . TUBAL LIGATION      OB History   No obstetric history on file.      Home Medications    Prior to Admission medications   Medication Sig Start Date End Date Taking? Authorizing Provider  albuterol (PROVENTIL) (2.5 MG/3ML) 0.083% nebulizer solution Take 3 mLs (2.5 mg total) by nebulization every 6 (six) hours as needed for wheezing or shortness of breath. 04/04/20   Truddie Hidden, MD  albuterol (VENTOLIN HFA) 108 (90 Base) MCG/ACT inhaler Inhale 2 puffs into the lungs every 6 (six) hours as needed for wheezing or shortness of  breath. Patient not taking: Reported on 04/04/2020    [provider]  albuterol (VENTOLIN HFA) 108 (90 Base) MCG/ACT inhaler Inhale 2 puffs into the lungs every 4 (four) hours as needed for wheezing or shortness of breath. 02/10/20   Margarita Mail, PA-C  blood glucose meter kit and supplies Dispense based on patient and insurance preference. Use up to four times daily as directed. (FOR ICD-10 E10.9, E11.9). 05/13/19   Azzie Glatter, FNP  Budesonide (PULMICORT FLEXHALER) 90 MCG/ACT inhaler Inhale 1 puff into the lungs 2 (two) times daily. Rinse mouth with water and spit after every use. 04/04/20   Truddie Hidden, MD  cephALEXin (KEFLEX) 500 MG capsule Take 1 capsule (500 mg total) by mouth 4 (four) times daily for 7 days. 06/17/20 06/24/20  Fransico Meadow, PA-C  levETIRAcetam (KEPPRA) 1000 MG tablet Take 1 tablet (1,000 mg total) by mouth 2 (two) times daily. 04/25/20   Larene Pickett, PA-C  METFORMIN HCL PO Take 1 tablet by mouth 2 (two) times daily with a meal.    [provider]  predniSONE (DELTASONE) 20 MG tablet Take 1 tablet (20 mg total) by mouth 2 (two) times daily with a meal. 04/04/20   Raylene Everts, MD    Family History Family History  Problem Relation Age of Onset  . Seizures Daughter   . Asthma Son   . Hypertension Father   . Arrhythmia Father  Status post pacemaker insertion  . Hypertension Mother   . Diabetes Mother     Social History Social History   Tobacco Use  . Smoking status: Never Smoker  . Smokeless tobacco: Never Used  Vaping Use  . Vaping Use: Never used  Substance Use Topics  . Alcohol use: Yes    Alcohol/week: 0.0 standard drinks    Comment: Occasional social  . Drug use: No     Allergies   Tramadol and Acetaminophen   Review of Systems Review of Systems  Genitourinary: Positive for dysuria.  All other systems reviewed and are negative.    Physical Exam Triage Vital Signs ED Triage Vitals  Enc Vitals Group      BP 06/17/20 1154 115/75     Pulse Rate 06/17/20 1154 70     Resp 06/17/20 1154 18     Temp 06/17/20 1154 97.9 F (36.6 C)     Temp Source 06/17/20 1154 Oral     SpO2 06/17/20 1154 100 %     Weight --      Height --      Head Circumference --      Peak Flow --      Pain Score 06/17/20 1153 9     Pain Loc --      Pain Edu? --      Excl. in [place]? --    No data found.  Updated Vital Signs BP 115/75 (BP Location: Left Arm)   Pulse 70   Temp 97.9 F (36.6 C) (Oral)   Resp 18   SpO2 100%   Visual Acuity Right Eye Distance:   Left Eye Distance:   Bilateral Distance:    Right Eye Near:   Left Eye Near:    Bilateral Near:     Physical Exam Vitals and nursing note reviewed.  Constitutional:      Appearance: She is well-developed.  HENT:     Head: Normocephalic.  Cardiovascular:     Rate and Rhythm: Normal rate.  Pulmonary:     Effort: Pulmonary effort is normal.  Abdominal:     General: There is no distension.     Comments: Suprapubic tenderness  Musculoskeletal:        General: Normal range of motion.     Cervical back: Normal range of motion.  Skin:    General: Skin is warm.  Neurological:     General: No focal deficit present.     Mental Status: She is alert and oriented to person, place, and time.  Psychiatric:        Mood and Affect: Mood normal.      UC Treatments / Results  Labs (all labs ordered are listed, but only abnormal results are displayed) Labs Reviewed  URINE CULTURE - Abnormal; Notable for the following components:      Result Value   Culture   (*)    Value: <10,000 COLONIES/mL INSIGNIFICANT GROWTH Performed at [facility], [address], [place], [place] [address]    All other components within normal limits  POCT URINALYSIS DIPSTICK, ED / UC - Abnormal; Notable for the following components:   Hgb urine dipstick MODERATE (*)    All other components within normal limits    EKG   Radiology No results  found.  Procedures Procedures (including critical care time)  Medications Ordered in UC Medications - No data to display  Initial Impression / Assessment and Plan / UC Course  I have reviewed the  triage vital signs and the nursing notes.  Pertinent labs & imaging results that were available during my care of the patient were reviewed by me and considered in my medical decision making (see chart for details).     MDM:  Pt given rx for keflex, culture ordered  Final Clinical Impressions(s) / UC Diagnoses   Final diagnoses:  Dysuria     Discharge Instructions     Your urine culture is pending    ED Prescriptions    Medication Sig Dispense Auth. Provider   cephALEXin (KEFLEX) 500 MG capsule Take 1 capsule (500 mg total) by mouth 4 (four) times daily for 7 days. 28 capsule Fransico Meadow, [place]     PDMP not reviewed this encounter.  An After Visit Summary was printed and given to the patient.    Fransico Meadow, [place] 06/18/20 1419

## 2020-07-20 ENCOUNTER — Other Ambulatory Visit: Payer: Self-pay

## 2020-07-20 ENCOUNTER — Encounter (HOSPITAL_COMMUNITY): Payer: Self-pay

## 2020-07-20 ENCOUNTER — Ambulatory Visit (HOSPITAL_COMMUNITY)
Admission: EM | Admit: 2020-07-20 | Discharge: 2020-07-20 | Disposition: A | Discharge status: Home / Self Care | Payer: Medicaid Other | Attending: Family Medicine | Admitting: Family Medicine

## 2020-07-20 DIAGNOSIS — J069 Acute upper respiratory infection, unspecified: Secondary | ICD-10-CM | POA: Diagnosis not present

## 2020-07-20 DIAGNOSIS — R062 Wheezing: Secondary | ICD-10-CM

## 2020-07-20 MED ORDER — PREDNISONE 20 MG PO TABS
40.0000 mg | ORAL_TABLET | Freq: Every day | ORAL | 0 refills | Status: DC
Start: 2020-07-20 — End: 2020-10-13

## 2020-07-20 NOTE — ED Provider Notes (Signed)
[facility]   [id number] 07/20/20 Arrival Time: 1849  ASSESSMENT & PLAN:  1. Viral URI with cough   2. Wheezing      Negative COVID test yesterday per pt report. Begin: Meds ordered this encounter  Medications  . predniSONE (DELTASONE) 20 MG tablet    Sig: Take 2 tablets (40 mg total) by mouth daily.    Dispense:  10 tablet    Refill:  0     Follow-up Information    Kallie Locks, FNP.   Specialty: Family Medicine Why: As needed. Contact information: [address] [place] [address] [phone]               Reviewed expectations re: course of current medical issues. Questions answered. Outlined signs and symptoms indicating need for more acute intervention. Understanding verbalized. After Visit Summary given.   SUBJECTIVE: History from: patient. Jennifer Crawford is a 44 y.o. female who reports cough, fatigue, body aches for the past 2-3 days; abrupt onset. Without CP but with questionable wheezing. Denies: sore throat and headache. Normal PO intake without n/v/d.    OBJECTIVE:  Vitals:   07/20/20 1925  BP: 126/89  Pulse: 74  Resp: 16  Temp: 98.1 F (36.7 C)  TempSrc: Oral  SpO2: 98%    General appearance: alert; no distress Eyes: PERRLA; EOMI; conjunctiva normal HENT: [place]; AT; with nasal congestion Neck: supple  Lungs: speaks full sentences without difficulty; unlabored; dry cough; bilateral mild exp wheezing Extremities: no edema Skin: warm and dry Neurologic: normal gait Psychological: alert and cooperative; normal mood and affect  Allergies  Allergen Reactions  . Tramadol Itching    Vaginal itching, per patient's original chart  . Acetaminophen Rash    Past Medical History:  Diagnosis Date  . Anxiety and depression   . Diabetes mellitus without complication ([facility])   . Right flank pain 05/2019  . Right upper quadrant pain 8/22020  . Seizures ([facility]) 2015  . Vitamin D deficiency 05/2019   Social  History   Socioeconomic History  . Marital status: Single    Spouse name: Not on file  . Number of children: Not on file  . Years of education: Not on file  . Highest education level: Not on file  Occupational History  . Not on file  Tobacco Use  . Smoking status: Never Smoker  . Smokeless tobacco: Never Used  Vaping Use  . Vaping Use: Never used  Substance and Sexual Activity  . Alcohol use: Yes    Alcohol/week: 0.0 standard drinks    Comment: Occasional social  . Drug use: No  . Sexual activity: Not on file  Other Topics Concern  . Not on file  Social History Narrative  . Not on file   Social Determinants of Health   Financial Resource Strain:   . Difficulty of Paying Living Expenses: Not on file  Food Insecurity:   . Worried About Programme researcher, broadcasting/film/video in the Last Year: Not on file  . Ran Out of Food in the Last Year: Not on file  Transportation Needs:   . Lack of Transportation (Medical): Not on file  . Lack of Transportation (Non-Medical): Not on file  Physical Activity:   . Days of Exercise per Week: Not on file  . Minutes of Exercise per Session: Not on file  Stress:   . Feeling of Stress : Not on file  Social Connections:   . Frequency of Communication with Friends and Family: Not  on file  . Frequency of Social Gatherings with Friends and Family: Not on file  . Attends Religious Services: Not on file  . Active Member of Clubs or Organizations: Not on file  . Attends Banker Meetings: Not on file  . Marital Status: Not on file  Intimate Partner Violence:   . Fear of Current or Ex-Partner: Not on file  . Emotionally Abused: Not on file  . Physically Abused: Not on file  . Sexually Abused: Not on file   Family History  Problem Relation Age of Onset  . Seizures Daughter   . Asthma Son   . Hypertension Father   . Arrhythmia Father        Status post pacemaker insertion  . Hypertension Mother   . Diabetes Mother    Past Surgical History:   Procedure Laterality Date  . ABDOMINAL HYSTERECTOMY    . TUBAL LIGATION       Mardella Layman, MD 07/20/20 2001

## 2020-07-20 NOTE — ED Triage Notes (Signed)
Pt presents with non productive cough, fatigue, generalized body aches, and shortness of breath X 2 days; Pt had negative covid test yesterday.   Pt has Hx of bronchitis: states she is not getting any relief with nebulizer

## 2020-08-05 ENCOUNTER — Emergency Department (HOSPITAL_COMMUNITY): Payer: Medicaid Other

## 2020-08-05 ENCOUNTER — Emergency Department (HOSPITAL_COMMUNITY)
Admission: EM | Admit: 2020-08-05 | Discharge: 2020-08-05 | Disposition: A | Discharge status: Home / Self Care | Payer: Medicaid Other | Attending: Emergency Medicine | Admitting: Emergency Medicine

## 2020-08-05 DIAGNOSIS — E119 Type 2 diabetes mellitus without complications: Secondary | ICD-10-CM | POA: Diagnosis not present

## 2020-08-05 DIAGNOSIS — Z7984 Long term (current) use of oral hypoglycemic drugs: Secondary | ICD-10-CM | POA: Diagnosis not present

## 2020-08-05 DIAGNOSIS — N390 Urinary tract infection, site not specified: Secondary | ICD-10-CM | POA: Diagnosis not present

## 2020-08-05 DIAGNOSIS — R109 Unspecified abdominal pain: Secondary | ICD-10-CM | POA: Diagnosis present

## 2020-08-05 LAB — CBC
HCT: 38.9 % (ref 36.0–46.0)
Hemoglobin: 12.5 g/dL (ref 12.0–15.0)
MCH: 31 pg (ref 26.0–34.0)
MCHC: 32.1 g/dL (ref 30.0–36.0)
MCV: 96.5 fL (ref 80.0–100.0)
Platelets: 170 10*3/uL (ref 150–400)
RBC: 4.03 MIL/uL (ref 3.87–5.11)
RDW: 13 % (ref 11.5–15.5)
WBC: 5 10*3/uL (ref 4.0–10.5)
nRBC: 0 % (ref 0.0–0.2)

## 2020-08-05 LAB — I-STAT BETA HCG BLOOD, ED (MC, WL, AP ONLY): I-stat hCG, quantitative: <5 m[IU]/mL (ref <5)

## 2020-08-05 LAB — URINALYSIS, ROUTINE W REFLEX MICROSCOPIC
Bacteria, UA: NONE SEEN
Bilirubin Urine: NEGATIVE
Bilirubin Urine: NEGATIVE
Glucose, UA: NEGATIVE mg/dL
Glucose, UA: NEGATIVE mg/dL
Hgb urine dipstick: NEGATIVE
Hgb urine dipstick: NEGATIVE
Ketones, ur: NEGATIVE mg/dL
Ketones, ur: NEGATIVE mg/dL
Nitrite: NEGATIVE
Nitrite: NEGATIVE
Protein, ur: NEGATIVE mg/dL
Protein, ur: NEGATIVE mg/dL
Specific Gravity, Urine: 1.023 (ref 1.005–1.030)
Specific Gravity, Urine: 1.01 (ref 1.005–1.030)
pH: 5 (ref 5.0–8.0)
pH: 5 (ref 5.0–8.0)

## 2020-08-05 LAB — BASIC METABOLIC PANEL
Anion gap: 9 (ref 5–15)
BUN: 15 mg/dL (ref 6–20)
CO2: 23 mmol/L (ref 22–32)
Calcium: 9.1 mg/dL (ref 8.9–10.3)
Chloride: 104 mmol/L (ref 98–111)
Creatinine, Ser: 0.93 mg/dL (ref 0.44–1.00)
GFR, Estimated: >60 mL/min (ref >60)
Glucose, Bld: 98 mg/dL (ref 70–99)
Potassium: 4.3 mmol/L (ref 3.5–5.1)
Sodium: 136 mmol/L (ref 135–145)

## 2020-08-05 MED ORDER — SODIUM CHLORIDE 0.9 % IV BOLUS
1000.0000 mL | Freq: Once | INTRAVENOUS | Status: AC
  Administered 2020-08-05: 1000 mL via INTRAVENOUS

## 2020-08-05 MED ORDER — KETOROLAC TROMETHAMINE 15 MG/ML IJ SOLN
15.0000 mg | Freq: Once | INTRAMUSCULAR | Status: AC
  Administered 2020-08-05: 15 mg via INTRAVENOUS
  Filled 2020-08-05: qty 1

## 2020-08-05 MED ORDER — CEPHALEXIN 500 MG PO CAPS: 500.0000 mg | ORAL_CAPSULE | Freq: Four times a day (QID) | ORAL | 0 refills | Status: AC

## 2020-08-05 MED ORDER — IBUPROFEN 800 MG PO TABS: 800.0000 mg | ORAL_TABLET | Freq: Three times a day (TID) | ORAL | 0 refills | Status: DC

## 2020-08-05 NOTE — ED Triage Notes (Signed)
Pt with L flank pain, urinary frequency, fever, and chills x 3 days. Endorses headache today. Hx recurring UTI and kidney infections.

## 2020-08-05 NOTE — Discharge Instructions (Signed)
If you develop worsening, continued, or recurrent abdominal pain, uncontrolled vomiting, fever, chest or back pain, or any other new/concerning symptoms then return to the ER for evaluation.  

## 2020-08-05 NOTE — ED Provider Notes (Signed)
[facility] Provider Note   CSN: [id number] Arrival date & time: 08/05/20  2248     History Chief Complaint  Patient presents with  . Flank Pain    Jennifer Crawford is a 44 y.o. female.  HPI 44 year old female presents with left-sided abdominal pain and flank pain.  She states this has been ongoing for about 3 days.  Similar history of multiple prior episodes.  She states it feels like a combination of prior UTI/kidney infections as well as kidney stones.  Has taken some ibuprofen.  She reports temperature up to 99 and chills.  No dysuria but urinary frequency.  Feels generally weak but no focal weakness.  The pain goes all the way up from her left lower abdomen to her CVA and then into her chest and shoulder. The pain is constant.   Past Medical History:  Diagnosis Date  . Anxiety and depression   . Diabetes mellitus without complication ([place])   . Right flank pain 05/2019  . Right upper quadrant pain 8/22020  . Seizures ([place]) 2015  . Vitamin D deficiency 05/2019    Patient Active Problem List   Diagnosis Date Noted  . Hypoglycemia 06/27/2019  . Hematuria 06/27/2019  . Low back pain with sciatica 05/31/2019  . Left flank pain 05/31/2019  . Intractable headache 05/13/2019  . Right upper quadrant pain 05/13/2019  . Right flank pain 05/13/2019  . Diabetes mellitus type 2 in nonobese ([place]) 06/20/2015  . Elevated blood alcohol level 06/20/2015  . Anxiety and depression 06/20/2015  . Seizure disorder ([place]) 06/19/2015    Past Surgical History:  Procedure Laterality Date  . ABDOMINAL HYSTERECTOMY    . TUBAL LIGATION       OB History   No obstetric history on file.     Family History  Problem Relation Age of Onset  . Seizures Daughter   . Asthma Son   . Hypertension Father   . Arrhythmia Father        Status post pacemaker insertion  . Hypertension Mother   . Diabetes Mother     Social History   Tobacco Use  .  Smoking status: Never Smoker  . Smokeless tobacco: Never Used  Vaping Use  . Vaping Use: Never used  Substance Use Topics  . Alcohol use: Yes    Alcohol/week: 0.0 standard drinks    Comment: Occasional social  . Drug use: No    Home Medications Prior to Admission medications   Medication Sig Start Date End Date Taking? Authorizing Provider  albuterol (PROVENTIL) (2.5 MG/3ML) 0.083% nebulizer solution Take 3 mLs (2.5 mg total) by nebulization every 6 (six) hours as needed for wheezing or shortness of breath. 04/04/20   Truddie Hidden, MD  albuterol (VENTOLIN HFA) 108 (90 Base) MCG/ACT inhaler Inhale 2 puffs into the lungs every 6 (six) hours as needed for wheezing or shortness of breath. Patient not taking: Reported on 04/04/2020    [provider]  albuterol (VENTOLIN HFA) 108 (90 Base) MCG/ACT inhaler Inhale 2 puffs into the lungs every 4 (four) hours as needed for wheezing or shortness of breath. 02/10/20   Margarita Mail, PA-C  blood glucose meter kit and supplies Dispense based on patient and insurance preference. Use up to four times daily as directed. (FOR ICD-10 E10.9, E11.9). 05/13/19   Azzie Glatter, FNP  Budesonide (PULMICORT FLEXHALER) 90 MCG/ACT inhaler Inhale 1 puff into the lungs 2 (two) times daily. Rinse mouth with water  and spit after every use. 04/04/20   Truddie Hidden, MD  cephALEXin (KEFLEX) 500 MG capsule Take 1 capsule (500 mg total) by mouth 4 (four) times daily for 10 days. 08/05/20 08/15/20  Sherwood Gambler, MD  ibuprofen (ADVIL) 800 MG tablet Take 1 tablet (800 mg total) by mouth 3 (three) times daily. 08/05/20   Sherwood Gambler, MD  levETIRAcetam (KEPPRA) 1000 MG tablet Take 1 tablet (1,000 mg total) by mouth 2 (two) times daily. 04/25/20   Larene Pickett, PA-C  METFORMIN HCL PO Take 1 tablet by mouth 2 (two) times daily with a meal.    [provider]  predniSONE (DELTASONE) 20 MG tablet Take 2 tablets (40 mg total) by mouth daily. 07/20/20    Vanessa Kick, MD    Allergies    Tramadol and Acetaminophen  Review of Systems   Review of Systems  Constitutional: Positive for chills and fever.  Cardiovascular: Positive for chest pain.  Gastrointestinal: Positive for abdominal pain. Negative for vomiting.  Genitourinary: Positive for flank pain and frequency. Negative for dysuria.  Musculoskeletal: Positive for back pain.  All other systems reviewed and are negative.   Physical Exam Updated Vital Signs BP 126/88 (BP Location: Right Arm)   Pulse (!) 57   Temp 97.6 F (36.4 C) (Oral)   Resp 14   Ht 6' 1" (1.854 m)   Wt 83.9 kg   SpO2 100%   BMI 24.41 kg/m   Physical Exam Vitals and nursing note reviewed.  Constitutional:      General: She is not in acute distress.    Appearance: She is well-developed. She is not ill-appearing or diaphoretic.  HENT:     Head: Normocephalic and atraumatic.     Right Ear: External ear normal.     Left Ear: External ear normal.     Nose: Nose normal.  Eyes:     General:        Right eye: No discharge.        Left eye: No discharge.  Cardiovascular:     Rate and Rhythm: Normal rate and regular rhythm.     Heart sounds: Normal heart sounds.  Pulmonary:     Effort: Pulmonary effort is normal.     Breath sounds: Normal breath sounds.  Abdominal:     Palpations: Abdomen is soft.     Tenderness: There is abdominal tenderness in the left lower quadrant. There is left CVA tenderness.    Skin:    General: Skin is warm and dry.  Neurological:     Mental Status: She is alert.  Psychiatric:        Mood and Affect: Mood is not anxious.     ED Results / Procedures / Treatments   Labs (all labs ordered are listed, but only abnormal results are displayed) Labs Reviewed  URINALYSIS, ROUTINE W REFLEX MICROSCOPIC - Abnormal; Notable for the following components:      Result Value   APPearance HAZY (*)    Leukocytes,Ua SMALL (*)    All other components within normal limits   URINALYSIS, ROUTINE W REFLEX MICROSCOPIC - Abnormal; Notable for the following components:   Leukocytes,Ua SMALL (*)    Bacteria, UA RARE (*)    All other components within normal limits  URINE CULTURE  URINE CULTURE  BASIC METABOLIC PANEL  CBC  I-STAT BETA HCG BLOOD, ED ([facility], [facility], [facility] ONLY)    EKG EKG Interpretation  Date/Time:  Thursday August 05 2020 08:30:21 EDT Ventricular  Rate:  61 PR Interval:  200 QRS Duration: 88 QT Interval:  428 QTC Calculation: 430 R Axis:   78 Text Interpretation: Normal sinus rhythm no acute ST/T changes similar to July 2021 Confirmed by Sherwood Gambler [phone]) on 08/05/2020 8:39:57 AM   Radiology US Renal  Result Date: 08/05/2020 CLINICAL DATA:  Left flank pain. EXAM: RENAL / URINARY TRACT ULTRASOUND COMPLETE COMPARISON:  Abdomen and pelvis CT dated 03/20/2020 FINDINGS: Right Kidney: Renal measurements: 9.8 x 5.1 x 4.0 cm = volume: 105 mL. Echogenicity within normal limits. No mass or hydronephrosis visualized. Left Kidney: Renal measurements: 11.1 x 6.5 x 5.8 cm = volume: 217 mL. Echogenicity within normal limits. No mass or hydronephrosis visualized. Bladder: Appears normal for degree of bladder distention. Other: Multiple gallstones in the gallbladder measuring up to 1.8 cm in maximum diameter each. No gallbladder wall thickening or pericholecystic fluid seen. IMPRESSION: 1. Normal appearing kidneys and urinary bladder. 2. Cholelithiasis. Electronically Signed   By: Claudie Revering M.D.   On: 08/05/2020 09:41    Procedures Procedures (including critical care time)  Medications Ordered in ED Medications  sodium chloride 0.9 % bolus 1,000 mL (1,000 mLs Intravenous New Bag/Given 08/05/20 0828)  ketorolac (TORADOL) 15 MG/ML injection 15 mg (15 mg Intravenous Given 08/05/20 5027)    ED Course  I have reviewed the triage vital signs and the nursing notes.  Pertinent labs & imaging results that were available during my care of the patient were reviewed  by me and considered in my medical decision making (see chart for details).    MDM Rules/Calculators/A&P                          Patient's vital signs are unremarkable.  The patient has a history of similar episodes.  Her labs have been reviewed and show normal WBC, normal metabolic panel, and small leukocytes.  Initial UA was contaminated but on repeat UA with better sample, still has leukocytes and rare bacteria.  Given her symptoms I think is reasonable to treat as UTI/may be early Pyelo.  Renal ultrasound is unremarkable and given no hydronephrosis my suspicion of ureteral stone/infected stone is pretty low.  While she does have tenderness on her abdominal exam, she appears well and is pretty mild so I do not think CT is needed.  She has had multiple CTs over the last year and a half that have all been negative.  At this point I think treating with antibiotics and NSAIDs, she is requesting 800 mg ibuprofen Rx, and follow-up with PCP is reasonable.  If symptoms were to worsen she should return. Final Clinical Impression(s) / ED Diagnoses Final diagnoses:  Acute UTI (urinary tract infection)    Rx / DC Orders ED Discharge Orders         Ordered    cephALEXin (KEFLEX) 500 MG capsule  4 times daily        08/05/20 1016    ibuprofen (ADVIL) 800 MG tablet  3 times daily        08/05/20 1016           Sherwood Gambler, MD 08/05/20 1028

## 2020-08-06 LAB — URINE CULTURE
Culture: <10000 — AB
Culture: <10000 — AB

## 2020-08-07 ENCOUNTER — Emergency Department (HOSPITAL_COMMUNITY)
Admission: EM | Admit: 2020-08-07 | Discharge: 2020-08-07 | Disposition: A | Discharge status: Home / Self Care | Payer: Medicaid Other | Attending: Emergency Medicine | Admitting: Emergency Medicine

## 2020-08-07 ENCOUNTER — Other Ambulatory Visit: Payer: Self-pay

## 2020-08-07 ENCOUNTER — Encounter (HOSPITAL_COMMUNITY): Payer: Self-pay

## 2020-08-07 DIAGNOSIS — E119 Type 2 diabetes mellitus without complications: Secondary | ICD-10-CM | POA: Diagnosis not present

## 2020-08-07 DIAGNOSIS — Z7984 Long term (current) use of oral hypoglycemic drugs: Secondary | ICD-10-CM | POA: Diagnosis not present

## 2020-08-07 DIAGNOSIS — R569 Unspecified convulsions: Secondary | ICD-10-CM | POA: Insufficient documentation

## 2020-08-07 LAB — CBC WITH DIFFERENTIAL/PLATELET
Abs Immature Granulocytes: 0.01 10*3/uL (ref 0.00–0.07)
Basophils Absolute: 0 10*3/uL (ref 0.0–0.1)
Basophils Relative: 1 %
Eosinophils Absolute: 0 10*3/uL (ref 0.0–0.5)
Eosinophils Relative: 1 %
HCT: 35.9 % — ABNORMAL LOW (ref 36.0–46.0)
Hemoglobin: 11.6 g/dL — ABNORMAL LOW (ref 12.0–15.0)
Immature Granulocytes: 0 %
Lymphocytes Relative: 33 %
Lymphs Abs: 1.3 10*3/uL (ref 0.7–4.0)
MCH: 31.7 pg (ref 26.0–34.0)
MCHC: 32.3 g/dL (ref 30.0–36.0)
MCV: 98.1 fL (ref 80.0–100.0)
Monocytes Absolute: 0.3 10*3/uL (ref 0.1–1.0)
Monocytes Relative: 6 %
Neutro Abs: 2.4 10*3/uL (ref 1.7–7.7)
Neutrophils Relative %: 59 %
Platelets: 160 10*3/uL (ref 150–400)
RBC: 3.66 MIL/uL — ABNORMAL LOW (ref 3.87–5.11)
RDW: 13 % (ref 11.5–15.5)
WBC: 4.1 10*3/uL (ref 4.0–10.5)
nRBC: 0 % (ref 0.0–0.2)

## 2020-08-07 LAB — BASIC METABOLIC PANEL
Anion gap: 8 (ref 5–15)
BUN: 8 mg/dL (ref 6–20)
CO2: 20 mmol/L — ABNORMAL LOW (ref 22–32)
Calcium: 9.1 mg/dL (ref 8.9–10.3)
Chloride: 110 mmol/L (ref 98–111)
Creatinine, Ser: 0.87 mg/dL (ref 0.44–1.00)
GFR, Estimated: >60 mL/min (ref >60)
Glucose, Bld: 105 mg/dL — ABNORMAL HIGH (ref 70–99)
Potassium: 3.8 mmol/L (ref 3.5–5.1)
Sodium: 138 mmol/L (ref 135–145)

## 2020-08-07 LAB — ETHANOL: Alcohol, Ethyl (B): 155 mg/dL — ABNORMAL HIGH (ref <10)

## 2020-08-07 MED ORDER — LEVETIRACETAM IN NACL 1000 MG/100ML IV SOLN
1000.0000 mg | Freq: Once | INTRAVENOUS | Status: AC
  Administered 2020-08-07: 1000 mg via INTRAVENOUS
  Filled 2020-08-07: qty 100

## 2020-08-07 NOTE — ED Triage Notes (Signed)
Per EMS, son hear pt yell for help- son saw pt have seizure in bed- no fall. Pt has hx of seizures and takes Keppra. EMS states there was alcohol on scene and pt smells like alcohol. Pt was incontinent en route to ED. Pt currently postictal and sleeping. 10mg  versed given by ems

## 2020-08-07 NOTE — ED Provider Notes (Addendum)
[facility] EMERGENCY DEPARTMENT Provider Note   CSN: [id number] Arrival date & time: 08/07/20  0451     History Chief Complaint  Patient presents with  . Seizures    Jennifer Crawford is a 44 y.o. female.  Patient presents to the emergency department from home for evaluation of seizure.  Patient's son apparently heard her cry out and found her actively having a generalized tonic-clonic seizure.  On arrival to the ER patient is no longer seizing, is somnolent and postictal.  Level 5 caveat due to        Past Medical History:  Diagnosis Date  . Anxiety and depression   . Diabetes mellitus without complication ([place])   . Right flank pain 05/2019  . Right upper quadrant pain 8/22020  . Seizures ([place]) 2015  . Vitamin D deficiency 05/2019    Patient Active Problem List   Diagnosis Date Noted  . Hypoglycemia 06/27/2019  . Hematuria 06/27/2019  . Low back pain with sciatica 05/31/2019  . Left flank pain 05/31/2019  . Intractable headache 05/13/2019  . Right upper quadrant pain 05/13/2019  . Right flank pain 05/13/2019  . Diabetes mellitus type 2 in nonobese ([place]) 06/20/2015  . Elevated blood alcohol level 06/20/2015  . Anxiety and depression 06/20/2015  . Seizure disorder ([place]) 06/19/2015    Past Surgical History:  Procedure Laterality Date  . ABDOMINAL HYSTERECTOMY    . TUBAL LIGATION       OB History   No obstetric history on file.     Family History  Problem Relation Age of Onset  . Seizures Daughter   . Asthma Son   . Hypertension Father   . Arrhythmia Father        Status post pacemaker insertion  . Hypertension Mother   . Diabetes Mother     Social History   Tobacco Use  . Smoking status: Never Smoker  . Smokeless tobacco: Never Used  Vaping Use  . Vaping Use: Never used  Substance Use Topics  . Alcohol use: Yes    Alcohol/week: 0.0 standard drinks    Comment: Occasional social  . Drug use: No    Home  Medications Prior to Admission medications   Medication Sig Start Date End Date Taking? Authorizing Provider  albuterol (PROVENTIL) (2.5 MG/3ML) 0.083% nebulizer solution Take 3 mLs (2.5 mg total) by nebulization every 6 (six) hours as needed for wheezing or shortness of breath. 04/04/20   Truddie Hidden, MD  albuterol (VENTOLIN HFA) 108 (90 Base) MCG/ACT inhaler Inhale 2 puffs into the lungs every 6 (six) hours as needed for wheezing or shortness of breath. Patient not taking: Reported on 04/04/2020    [provider]  albuterol (VENTOLIN HFA) 108 (90 Base) MCG/ACT inhaler Inhale 2 puffs into the lungs every 4 (four) hours as needed for wheezing or shortness of breath. 02/10/20   Margarita Mail, PA-C  blood glucose meter kit and supplies Dispense based on patient and insurance preference. Use up to four times daily as directed. (FOR ICD-10 E10.9, E11.9). 05/13/19   Azzie Glatter, FNP  Budesonide (PULMICORT FLEXHALER) 90 MCG/ACT inhaler Inhale 1 puff into the lungs 2 (two) times daily. Rinse mouth with water and spit after every use. 04/04/20   Truddie Hidden, MD  ibuprofen (ADVIL) 800 MG tablet Take 1 tablet (800 mg total) by mouth 3 (three) times daily. 08/05/20   Sherwood Gambler, MD  levETIRAcetam (KEPPRA) 1000 MG tablet Take 1 tablet (1,000 mg  total) by mouth 2 (two) times daily. 04/25/20   Larene Pickett, PA-C  METFORMIN HCL PO Take 1 tablet by mouth 2 (two) times daily with a meal.    [provider]  predniSONE (DELTASONE) 20 MG tablet Take 2 tablets (40 mg total) by mouth daily. 07/20/20   Vanessa Kick, MD    Allergies    Tramadol and Acetaminophen  Review of Systems   Review of Systems  Unable to perform ROS: Mental status change    Physical Exam Updated Vital Signs BP 105/81   Pulse 63   Temp 97.9 F (36.6 C) (Oral)   Resp 16   Ht 6' 1"  (1.854 m)   Wt 84 kg   SpO2 100%   BMI 24.43 kg/m   Physical Exam Vitals and nursing note reviewed.   Constitutional:      General: She is not in acute distress.    Appearance: Normal appearance. She is well-developed.  HENT:     Head: Normocephalic and atraumatic.     Right Ear: Hearing normal.     Left Ear: Hearing normal.     Nose: Nose normal.  Eyes:     Conjunctiva/sclera: Conjunctivae normal.     Pupils: Pupils are equal, round, and reactive to light.  Cardiovascular:     Rate and Rhythm: Regular rhythm.     Heart sounds: S1 normal and S2 normal. No murmur heard.  No friction rub. No gallop.   Pulmonary:     Effort: Pulmonary effort is normal. No respiratory distress.     Breath sounds: Normal breath sounds.  Chest:     Chest wall: No tenderness.  Abdominal:     General: Bowel sounds are normal.     Palpations: Abdomen is soft.     Tenderness: There is no abdominal tenderness. There is no guarding or rebound. Negative signs include Murphy's sign and McBurney's sign.     Hernia: No hernia is present.  Musculoskeletal:        General: Normal range of motion.     Cervical back: Normal range of motion and neck supple.  Skin:    General: Skin is warm and dry.     Findings: No rash.  Neurological:     Mental Status: She is alert and oriented to person, place, and time.     GCS: GCS eye subscore is 4. GCS verbal subscore is 4. GCS motor subscore is 6.     Cranial Nerves: No cranial nerve deficit.     Sensory: No sensory deficit.     Coordination: Coordination normal.  Psychiatric:        Speech: Speech normal.        Behavior: Behavior normal.        Thought Content: Thought content normal.     ED Results / Procedures / Treatments   Labs (all labs ordered are listed, but only abnormal results are displayed) Labs Reviewed  CBC WITH DIFFERENTIAL/PLATELET - Abnormal; Notable for the following components:      Result Value   RBC 3.66 (*)    Hemoglobin 11.6 (*)    HCT 35.9 (*)    All other components within normal limits  BASIC METABOLIC PANEL - Abnormal; Notable for  the following components:   CO2 20 (*)    Glucose, Bld 105 (*)    All other components within normal limits  ETHANOL - Abnormal; Notable for the following components:   Alcohol, Ethyl (B) 155 (*)  All other components within normal limits    EKG EKG Interpretation  Date/Time:  Saturday August 07 2020 05:39:25 EST Ventricular Rate:  70 PR Interval:    QRS Duration: 84 QT Interval:  414 QTC Calculation: 447 R Axis:   72 Text Interpretation: Sinus rhythm Borderline prolonged PR interval Confirmed by Orpah Greek [phone]) on 08/07/2020 5:54:16 AM Also confirmed by Orpah Greek [phone]), editor [place], [place] [phone])  on 08/08/2020 8:05:52 AM   Radiology No results found.  Procedures Procedures (including critical care time)  Medications Ordered in ED Medications  levETIRAcetam (KEPPRA) IVPB 1000 mg/100 mL premix (0 mg Intravenous Stopped 08/07/20 8242)    ED Course  I have reviewed the triage vital signs and the nursing notes.  Pertinent labs & imaging results that were available during my care of the patient were reviewed by me and considered in my medical decision making (see chart for details).    MDM Rules/Calculators/A&P                          Patient presents to the emergency department after a seizure.  Patient has a known seizure disorder.  She reportedly takes Keppra.  Postictal at arrival.  She also is intoxicated.  Patient has been monitored and has become more awake and alert.  She was given additional Keppra here in the emergency department, no further seizure activity.  Will be appropriate for discharge.  Final Clinical Impression(s) / ED Diagnoses Final diagnoses:  Seizure [facility])    Rx / DC Orders ED Discharge Orders    None       Ardell Makarewicz, Gwenyth Allegra, MD 08/07/20 3536    Orpah Greek, MD 09/07/20 [phone]

## 2020-08-07 NOTE — ED Notes (Signed)
Provider at bedside talking with patient. Plan to observe "a little while longer" then D/C.

## 2020-08-07 NOTE — ED Notes (Signed)
Pt agreed to wheelchair to lobby, cab waiting.

## 2020-08-07 NOTE — Discharge Instructions (Addendum)
Please make sure you take your Keppra as prescribed.  Follow-up with your neurologist.  Please avoid alcohol.

## 2020-08-23 ENCOUNTER — Ambulatory Visit: Payer: Self-pay | Admitting: Family Medicine

## 2020-10-13 ENCOUNTER — Ambulatory Visit (HOSPITAL_COMMUNITY)
Admission: EM | Admit: 2020-10-13 | Discharge: 2020-10-13 | Disposition: A | Discharge status: Home / Self Care | Payer: Medicaid Other | Attending: Emergency Medicine | Admitting: Emergency Medicine

## 2020-10-13 ENCOUNTER — Other Ambulatory Visit: Payer: Self-pay

## 2020-10-13 ENCOUNTER — Encounter (HOSPITAL_COMMUNITY): Payer: Self-pay | Admitting: *Deleted

## 2020-10-13 DIAGNOSIS — J069 Acute upper respiratory infection, unspecified: Secondary | ICD-10-CM

## 2020-10-13 DIAGNOSIS — U071 COVID-19: Secondary | ICD-10-CM | POA: Diagnosis not present

## 2020-10-13 DIAGNOSIS — R062 Wheezing: Secondary | ICD-10-CM | POA: Diagnosis present

## 2020-10-13 LAB — SARS CORONAVIRUS 2 (TAT 6-24 HRS): SARS Coronavirus 2: POSITIVE — AB

## 2020-10-13 MED ORDER — ALBUTEROL SULFATE HFA 108 (90 BASE) MCG/ACT IN AERS
INHALATION_SPRAY | RESPIRATORY_TRACT | Status: AC
  Filled 2020-10-13: qty 6.7

## 2020-10-13 MED ORDER — ALBUTEROL SULFATE HFA 108 (90 BASE) MCG/ACT IN AERS
2.0000 | INHALATION_SPRAY | Freq: Once | RESPIRATORY_TRACT | Status: AC
  Administered 2020-10-13: 2 via RESPIRATORY_TRACT

## 2020-10-13 MED ORDER — PREDNISONE 20 MG PO TABS: 40.0000 mg | ORAL_TABLET | Freq: Every day | ORAL | 0 refills | Status: AC

## 2020-10-13 MED ORDER — PULMICORT FLEXHALER 90 MCG/ACT IN AEPB: 1.0000 | INHALATION_SPRAY | Freq: Two times a day (BID) | RESPIRATORY_TRACT | 0 refills | Status: AC

## 2020-10-13 NOTE — ED Provider Notes (Signed)
[facility]    CSN: [id number] Arrival date & time: 10/13/20  1406      History   Chief Complaint Chief Complaint  Patient presents with  . Shortness of Breath  . Cough  . Headache    HPI Jennifer Crawford is a 45 y.o. female.   Jennifer Crawford presents with complaints of shortness of breath , cough, headache and body aches which started around two days ago. No known fevers. No gi symptoms. Has been using her nebulizer which hasn't helped much. History of bronchitis. Doesn't have an inhaler currently. 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 She is vaccinated for covid-19. No known ill contacts. History of dm and seizures.    ROS per HPI, negative if not otherwise mentioned.      Past Medical History:  Diagnosis Date  . Anxiety and depression   . Diabetes mellitus without complication ([place])   . Right flank pain 05/2019  . Right upper quadrant pain 8/22020  . Seizures ([place]) 2015  . Vitamin D deficiency 05/2019    Patient Active Problem List   Diagnosis Date Noted  . Hypoglycemia 06/27/2019  . Hematuria 06/27/2019  . Low back pain with sciatica 05/31/2019  . Left flank pain 05/31/2019  . Intractable headache 05/13/2019  . Right upper quadrant pain 05/13/2019  . Right flank pain 05/13/2019  . Diabetes mellitus type 2 in nonobese ([place]) 06/20/2015  . Elevated blood alcohol level 06/20/2015  . Anxiety and depression 06/20/2015  . Seizure disorder ([place]) 06/19/2015    Past Surgical History:  Procedure Laterality Date  . ABDOMINAL HYSTERECTOMY    . TUBAL LIGATION      OB History   No obstetric history on file.      Home Medications    Prior to Admission medications   Medication Sig Start Date End Date Taking? Authorizing Provider  albuterol (PROVENTIL) (2.5 MG/3ML) 0.083% nebulizer solution Take 3 mLs (2.5 mg total) by nebulization every 6  (six) hours as needed for wheezing or shortness of breath. 04/04/20  Yes Truddie Hidden, MD  blood glucose meter kit and supplies Dispense based on patient and insurance preference. Use up to four times daily as directed. (FOR ICD-10 E10.9, E11.9). 05/13/19  Yes Azzie Glatter, FNP  ibuprofen (ADVIL) 800 MG tablet Take 1 tablet (800 mg total) by mouth 3 (three) times daily. 08/05/20  Yes Sherwood Gambler, MD  levETIRAcetam (KEPPRA) 1000 MG tablet Take 1 tablet (1,000 mg total) by mouth 2 (two) times daily. 04/25/20  Yes Larene Pickett, PA-C  METFORMIN HCL PO Take 1 tablet by mouth 2 (two) times daily with a meal.   Yes [provider]  albuterol (VENTOLIN HFA) 108 (90 Base) MCG/ACT inhaler Inhale 2 puffs into the lungs every 6 (six) hours as needed for wheezing or shortness of breath. Patient not taking: Reported on 04/04/2020    [provider]  albuterol (VENTOLIN HFA) 108 (90 Base) MCG/ACT inhaler Inhale 2 puffs into the lungs every 4 (four) hours as needed for wheezing or shortness of breath. 02/10/20   Harris, Abigail, PA-C  Budesonide (PULMICORT FLEXHALER) 90 MCG/ACT inhaler Inhale 1 puff into the lungs 2 (two) times daily. Rinse mouth with water and spit after every use. 10/13/20   Yazmyn Valbuena, Malachy Moan, NP  predniSONE (DELTASONE) 20 MG tablet Take 2 tablets (40 mg total) by mouth daily for  5 days. 10/13/20 10/18/20  Zigmund Gottron, NP    Family History Family History  Problem Relation Age of Onset  . Seizures Daughter   . Asthma Son   . Hypertension Father   . Arrhythmia Father        Status post pacemaker insertion  . Hypertension Mother   . Diabetes Mother     Social History Social History   Tobacco Use  . Smoking status: Never Smoker  . Smokeless tobacco: Never Used  Vaping Use  . Vaping Use: Never used  Substance Use Topics  . Alcohol use: Yes    Alcohol/week: 0.0 standard drinks    Comment: Occasional social  . Drug use: No     Allergies   Tramadol and  Acetaminophen   Review of Systems Review of Systems   Physical Exam Triage Vital Signs ED Triage Vitals  Enc Vitals Group     BP 10/13/20 1506 (!) 129/94     Pulse Rate 10/13/20 1506 73     Resp 10/13/20 1506 20     Temp 10/13/20 1506 98.1 F (36.7 C)     Temp Source 10/13/20 1506 Oral     SpO2 10/13/20 1506 100 %     Weight --      Height --      Head Circumference --      Peak Flow --      Pain Score 10/13/20 1509 9     Pain Loc --      Pain Edu? --      Excl. in [place]? --    No data found.  Updated Vital Signs BP (!) 129/94 (BP Location: Right Arm)   Pulse 73   Temp 98.1 F (36.7 C) (Oral)   Resp 20   SpO2 100%   Visual Acuity Right Eye Distance:   Left Eye Distance:   Bilateral Distance:    Right Eye Near:   Left Eye Near:    Bilateral Near:     Physical Exam Constitutional:      General: She is not in acute distress.    Appearance: She is well-developed.  Cardiovascular:     Rate and Rhythm: Normal rate.  Pulmonary:     Effort: Pulmonary effort is normal.     Breath sounds: Examination of the right-lower field reveals wheezing. Examination of the left-lower field reveals wheezing. Wheezing present.     Comments: Strong dry cough noted  Skin:    General: Skin is warm and dry.  Neurological:     Mental Status: She is alert and oriented to person, place, and time.      UC Treatments / Results  Labs (all labs ordered are listed, but only abnormal results are displayed) Labs Reviewed  SARS CORONAVIRUS 2 (TAT 6-24 HRS)    EKG   Radiology No results found.  Procedures Procedures (including critical care time)  Medications Ordered in UC Medications  albuterol (VENTOLIN HFA) 108 (90 Base) MCG/ACT inhaler 2 puff (has no administration in time range)    Initial Impression / Assessment and Plan / UC Course  I have reviewed the triage vital signs and the nursing notes.  Pertinent labs & imaging results that were available during my care of  the patient were reviewed by me and considered in my medical decision making (see chart for details).     Cough, wheezing, inhaler and prednisone provided. Blood sugar precautions provided. Covid testing pending and isolation instructions provided.  History and physical  consistent with viral illness.  Supportive cares recommended. Return precautions provided. Patient verbalized understanding and agreeable to plan.    Final Clinical Impressions(s) / UC Diagnoses   Final diagnoses:  Upper respiratory tract infection, unspecified type  Wheezing     Discharge Instructions     Push fluids to ensure adequate hydration and keep secretions thin.  Tylenol and/or ibuprofen as needed for pain or fevers.  Monitor your blood sugar while you are on prednisone.  5 days of prednisone.  Inhaler and/or nebulizer as needed for shortness of breath .  Restart your pulmicort inhaler.  Self isolate until covid results are back.  We will notify you by phone if it is positive. Your negative results will be sent through your MyChart.    If it is positive you need to isolate from others for a total of 5 days. If no fever for 24 hours without medications, and symptoms improving you may end isolation on day 6, but wear a mask if around any others for an additional 5 days.   If symptoms worsen or do not improve in the next week to return to be seen or to follow up with your PCP.     ED Prescriptions    Medication Sig Dispense Auth. Provider   Budesonide (PULMICORT FLEXHALER) 90 MCG/ACT inhaler Inhale 1 puff into the lungs 2 (two) times daily. Rinse mouth with water and spit after every use. 1 each Zigmund Gottron, NP   predniSONE (DELTASONE) 20 MG tablet Take 2 tablets (40 mg total) by mouth daily for 5 days. 10 tablet Zigmund Gottron, NP     PDMP not reviewed this encounter.   Zigmund Gottron, NP 10/13/20 1547

## 2020-10-13 NOTE — Discharge Instructions (Signed)
Push fluids to ensure adequate hydration and keep secretions thin.  Tylenol and/or ibuprofen as needed for pain or fevers.  Monitor your blood sugar while you are on prednisone.  5 days of prednisone.  Inhaler and/or nebulizer as needed for shortness of breath .  Restart your pulmicort inhaler.  Self isolate until covid results are back.  We will notify you by phone if it is positive. Your negative results will be sent through your MyChart.    If it is positive you need to isolate from others for a total of 5 days. If no fever for 24 hours without medications, and symptoms improving you may end isolation on day 6, but wear a mask if around any others for an additional 5 days.   If symptoms worsen or do not improve in the next week to return to be seen or to follow up with your PCP.

## 2020-10-13 NOTE — ED Triage Notes (Signed)
Pt reports she works at [employer] and she was tested yesterday and a Neg. Result for COVID. Pt reports waking today with cough,HA, SHOB  Pt reports using her nebulizer with out any relief of SHOB. SPO2 in triage 100% RA.

## 2020-10-14 ENCOUNTER — Telehealth: Payer: Self-pay | Admitting: Oncology

## 2020-10-14 ENCOUNTER — Encounter: Payer: Self-pay | Admitting: Oncology

## 2020-10-14 NOTE — Telephone Encounter (Signed)
Called to discuss with patient about COVID-19 symptoms and the use of one of the available treatments for those with mild to moderate Covid symptoms and at a high risk of hospitalization.  Pt appears to qualify for outpatient treatment due to co-morbid conditions and/or a member of an at-risk group in accordance with the FDA Emergency Use Authorization.    Symptom onset: 10/11/20 Vaccinated: YES Booster? YES Immunocompromised? NO Qualifiers:  Past Medical History:  Diagnosis Date  . Anxiety and depression   . Diabetes mellitus without complication ([facility])   . Right flank pain 05/2019  . Right upper quadrant pain 8/22020  . Seizures ([facility]) 2015  . Vitamin D deficiency 05/2019     Unable to reach pt - LEFT VM AND MCM  Mauro Kaufmann

## 2020-11-04 ENCOUNTER — Other Ambulatory Visit: Payer: Self-pay

## 2020-11-04 ENCOUNTER — Encounter (HOSPITAL_COMMUNITY): Payer: Self-pay | Admitting: Emergency Medicine

## 2020-11-04 ENCOUNTER — Emergency Department (HOSPITAL_COMMUNITY)
Admission: EM | Admit: 2020-11-04 | Discharge: 2020-11-05 | Disposition: A | Discharge status: Home / Self Care | Payer: Medicaid Other | Attending: Emergency Medicine | Admitting: Emergency Medicine

## 2020-11-04 ENCOUNTER — Emergency Department (HOSPITAL_COMMUNITY): Payer: Medicaid Other

## 2020-11-04 DIAGNOSIS — Z7984 Long term (current) use of oral hypoglycemic drugs: Secondary | ICD-10-CM | POA: Insufficient documentation

## 2020-11-04 DIAGNOSIS — Z79899 Other long term (current) drug therapy: Secondary | ICD-10-CM | POA: Diagnosis not present

## 2020-11-04 DIAGNOSIS — R825 Elevated urine levels of drugs, medicaments and biological substances: Secondary | ICD-10-CM | POA: Insufficient documentation

## 2020-11-04 DIAGNOSIS — R4182 Altered mental status, unspecified: Secondary | ICD-10-CM | POA: Insufficient documentation

## 2020-11-04 DIAGNOSIS — E119 Type 2 diabetes mellitus without complications: Secondary | ICD-10-CM | POA: Insufficient documentation

## 2020-11-04 DIAGNOSIS — R000 Tachycardia, unspecified: Secondary | ICD-10-CM | POA: Insufficient documentation

## 2020-11-04 DIAGNOSIS — R569 Unspecified convulsions: Secondary | ICD-10-CM | POA: Insufficient documentation

## 2020-11-04 LAB — RAPID URINE DRUG SCREEN, HOSP PERFORMED
Amphetamines: NOT DETECTED
Barbiturates: NOT DETECTED
Benzodiazepines: NOT DETECTED
Cocaine: POSITIVE — AB
Opiates: NOT DETECTED
Tetrahydrocannabinol: NOT DETECTED

## 2020-11-04 LAB — COMPREHENSIVE METABOLIC PANEL
ALT: 34 U/L (ref 0–44)
AST: 30 U/L (ref 15–41)
Albumin: 4.8 g/dL (ref 3.5–5.0)
Alkaline Phosphatase: 60 U/L (ref 38–126)
Anion gap: 11 (ref 5–15)
BUN: 10 mg/dL (ref 6–20)
CO2: 20 mmol/L — ABNORMAL LOW (ref 22–32)
Calcium: 9.6 mg/dL (ref 8.9–10.3)
Chloride: 106 mmol/L (ref 98–111)
Creatinine, Ser: 0.99 mg/dL (ref 0.44–1.00)
GFR, Estimated: >60 mL/min (ref >60)
Glucose, Bld: 109 mg/dL — ABNORMAL HIGH (ref 70–99)
Potassium: 3.6 mmol/L (ref 3.5–5.1)
Sodium: 137 mmol/L (ref 135–145)
Total Bilirubin: 0.7 mg/dL (ref 0.3–1.2)
Total Protein: 9 g/dL — ABNORMAL HIGH (ref 6.5–8.1)

## 2020-11-04 LAB — CBC WITH DIFFERENTIAL/PLATELET
Abs Immature Granulocytes: 0.02 10*3/uL (ref 0.00–0.07)
Basophils Absolute: 0 10*3/uL (ref 0.0–0.1)
Basophils Relative: 1 %
Eosinophils Absolute: 0 10*3/uL (ref 0.0–0.5)
Eosinophils Relative: 0 %
HCT: 42.5 % (ref 36.0–46.0)
Hemoglobin: 13.7 g/dL (ref 12.0–15.0)
Immature Granulocytes: 0 %
Lymphocytes Relative: 33 %
Lymphs Abs: 1.9 10*3/uL (ref 0.7–4.0)
MCH: 30.6 pg (ref 26.0–34.0)
MCHC: 32.2 g/dL (ref 30.0–36.0)
MCV: 95.1 fL (ref 80.0–100.0)
Monocytes Absolute: 0.3 10*3/uL (ref 0.1–1.0)
Monocytes Relative: 5 %
Neutro Abs: 3.5 10*3/uL (ref 1.7–7.7)
Neutrophils Relative %: 61 %
Platelets: 212 10*3/uL (ref 150–400)
RBC: 4.47 MIL/uL (ref 3.87–5.11)
RDW: 13.3 % (ref 11.5–15.5)
WBC: 5.8 10*3/uL (ref 4.0–10.5)
nRBC: 0 % (ref 0.0–0.2)

## 2020-11-04 LAB — URINALYSIS, ROUTINE W REFLEX MICROSCOPIC
Bacteria, UA: NONE SEEN
Bilirubin Urine: NEGATIVE
Glucose, UA: NEGATIVE mg/dL
Ketones, ur: NEGATIVE mg/dL
Nitrite: NEGATIVE
Protein, ur: 100 mg/dL — AB
Specific Gravity, Urine: 1.012 (ref 1.005–1.030)
pH: 5 (ref 5.0–8.0)

## 2020-11-04 LAB — CBG MONITORING, ED: Glucose-Capillary: 98 mg/dL (ref 70–99)

## 2020-11-04 LAB — ETHANOL: Alcohol, Ethyl (B): 128 mg/dL — ABNORMAL HIGH (ref <10)

## 2020-11-04 LAB — PREGNANCY, URINE: Preg Test, Ur: NEGATIVE

## 2020-11-04 MED ORDER — LEVETIRACETAM IN NACL 1000 MG/100ML IV SOLN
1000.0000 mg | Freq: Once | INTRAVENOUS | Status: AC
  Administered 2020-11-04: 1000 mg via INTRAVENOUS
  Filled 2020-11-04: qty 100

## 2020-11-04 MED ORDER — LORAZEPAM 2 MG/ML IJ SOLN
INTRAMUSCULAR | Status: AC
  Filled 2020-11-04: qty 1

## 2020-11-04 MED ORDER — ONDANSETRON HCL 4 MG/2ML IJ SOLN
4.0000 mg | Freq: Once | INTRAMUSCULAR | Status: AC
  Administered 2020-11-04: 4 mg via INTRAVENOUS
  Filled 2020-11-04: qty 2

## 2020-11-04 MED ORDER — LEVETIRACETAM 500 MG/5ML IV SOLN: 2000.0000 mg | Freq: Once | INTRAVENOUS | Status: DC

## 2020-11-04 NOTE — ED Notes (Signed)
Patient transported to CT on monitor with this rn

## 2020-11-04 NOTE — ED Notes (Signed)
Pt having another seizure at this time. Lasting 5 seconds

## 2020-11-04 NOTE — ED Notes (Signed)
Pt having another episode of seizure activity lasting 3 seconds, and making gaging noises. Md at bedside

## 2020-11-04 NOTE — ED Provider Notes (Addendum)
[facility] EMERGENCY DEPARTMENT Provider Note   CSN: [id number] Arrival date & time: 11/04/20  2105     History Chief Complaint  Patient presents with  . Seizures/ETOH    Jennifer Crawford is a 45 y.o. female.  HPI Level 5 caveat due to altered mental status. Reportedly came in for seizures.  Reportedly had 2 seizures this evening after drinking alcohol.  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  Reportedly had been alert and oriented in the waiting room.  However brought back and had possible seizure activity witnessed and lasted 5 to 15 seconds.  Bilateral shaking/tightness but also shaking her head sideways.  Initially eyes deviated to right but then appeared to look at me and follow somewhat during the activity.  Still not following commands for me.    Past Medical History:  Diagnosis Date  . Anxiety and depression   . Diabetes mellitus without complication ([place])   . Right flank pain 05/2019  . Right upper quadrant pain 8/22020  . Seizures ([place]) 2015  . Vitamin D deficiency 05/2019    Patient Active Problem List   Diagnosis Date Noted  . Hypoglycemia 06/27/2019  . Hematuria 06/27/2019  . Low back pain with sciatica 05/31/2019  . Left flank pain 05/31/2019  . Intractable headache 05/13/2019  . Right upper quadrant pain 05/13/2019  . Right flank pain 05/13/2019  . Diabetes mellitus type 2 in nonobese ([place]) 06/20/2015  . Elevated blood alcohol level 06/20/2015  . Anxiety and depression 06/20/2015  . Seizure disorder ([place]) 06/19/2015    Past Surgical History:  Procedure Laterality Date  . ABDOMINAL HYSTERECTOMY    . TUBAL LIGATION       OB History   No obstetric history on file.     Family History  Problem Relation Age of Onset  . Seizures Daughter   . Asthma Son   . Hypertension Father   . Arrhythmia Father        Status post pacemaker insertion  . Hypertension Mother    . Diabetes Mother     Social History   Tobacco Use  . Smoking status: Never Smoker  . Smokeless tobacco: Never Used  Vaping Use  . Vaping Use: Never used  Substance Use Topics  . Alcohol use: Yes    Alcohol/week: 0.0 standard drinks    Comment: Occasional social  . Drug use: No    Home Medications Prior to Admission medications   Medication Sig Start Date End Date Taking? Authorizing Provider  albuterol (PROVENTIL) (2.5 MG/3ML) 0.083% nebulizer solution Take 3 mLs (2.5 mg total) by nebulization every 6 (six) hours as needed for wheezing or shortness of breath. 04/04/20   Truddie Hidden, MD  albuterol (VENTOLIN HFA) 108 (90 Base) MCG/ACT inhaler Inhale 2 puffs into the lungs every 6 (six) hours as needed for wheezing or shortness of breath. Patient not taking: Reported on 04/04/2020    [provider]  albuterol (VENTOLIN HFA) 108 (90 Base) MCG/ACT inhaler Inhale 2 puffs into the lungs every 4 (four) hours as needed for wheezing or shortness of breath. 02/10/20   Margarita Mail, PA-C  blood glucose meter kit and supplies Dispense based on patient and insurance preference. Use up to four times daily as directed. (FOR ICD-10 E10.9, E11.9). 05/13/19   Azzie Glatter, FNP  Budesonide (PULMICORT FLEXHALER) 90 MCG/ACT inhaler Inhale 1 puff into the lungs 2 (two) times daily. Rinse mouth with water  and spit after every use. 10/13/20   Augusto Gamble B, NP  ibuprofen (ADVIL) 800 MG tablet Take 1 tablet (800 mg total) by mouth 3 (three) times daily. 08/05/20   Sherwood Gambler, MD  levETIRAcetam (KEPPRA) 1000 MG tablet Take 1 tablet (1,000 mg total) by mouth 2 (two) times daily. 04/25/20   Larene Pickett, PA-C  METFORMIN HCL PO Take 1 tablet by mouth 2 (two) times daily with a meal.    [provider]    Allergies    Tramadol and Acetaminophen  Review of Systems   Review of Systems  Unable to perform ROS: Mental status change    Physical Exam Updated Vital Signs BP  119/68   Pulse 98   Temp 98.3 F (36.8 C) (Oral)   Resp (!) 21   Ht 6' 1"  (1.854 m)   Wt 80 kg   SpO2 100%   BMI 23.27 kg/m   Physical Exam Vitals and nursing note reviewed.  HENT:     Head: Atraumatic.     Right Ear: External ear normal.     Left Ear: External ear normal.     Mouth/Throat:     Mouth: Mucous membranes are moist.  Eyes:     Comments: Pupils reactive.  By staring ahead.  Does have threat reflex.  Cardiovascular:     Rate and Rhythm: Tachycardia present.  Pulmonary:     Breath sounds: No wheezing, rhonchi or rales.  Abdominal:     General: There is no distension.     Tenderness: There is no abdominal tenderness.  Musculoskeletal:        General: No tenderness.     Cervical back: Neck supple.  Skin:    General: Skin is warm.  Neurological:     Comments: Episodic shaking episodes.  Finger is minimally flexed.  Shaking bilaterally.  Head will shake to the side.  Eyes initially look to right but then would look ahead and look at me.  Will not follow commands.  Later rolled to the side.  Later rolled onto her right side and grabbed onto the railing after the event.     ED Results / Procedures / Treatments   Labs (all labs ordered are listed, but only abnormal results are displayed) Labs Reviewed  ETHANOL - Abnormal; Notable for the following components:      Result Value   Alcohol, Ethyl (B) 128 (*)    All other components within normal limits  COMPREHENSIVE METABOLIC PANEL - Abnormal; Notable for the following components:   CO2 20 (*)    Glucose, Bld 109 (*)    Total Protein 9.0 (*)    All other components within normal limits  URINALYSIS, ROUTINE W REFLEX MICROSCOPIC - Abnormal; Notable for the following components:   Hgb urine dipstick MODERATE (*)    Protein, ur 100 (*)    Leukocytes,Ua MODERATE (*)    All other components within normal limits  CBC WITH DIFFERENTIAL/PLATELET  PREGNANCY, URINE  RAPID URINE DRUG SCREEN, HOSP PERFORMED  CBG  MONITORING, ED    EKG EKG Interpretation  Date/Time:  Thursday November 04 2020 22:18:05 EST Ventricular Rate:  123 PR Interval:    QRS Duration: 76 QT Interval:  287 QTC Calculation: 411 R Axis:   74 Text Interpretation: Sinus tachycardia LAE, consider biatrial enlargement Probable LVH with secondary repol abnrm ST depr, consider ischemia, inferior leads Confirmed by Davonna Belling [phone]) on 11/04/2020 11:12:57 PM  ED ECG REPORT   Date: 11/04/2020  Rate: 123  Rhythm: sinus tachycardia  QRS Axis: normal  Intervals: normal  ST/T Wave abnormalities: nonspecific ST/T changes  Conduction Disutrbances:none  Narrative Interpretation:   Old EKG Reviewed: changes noted  Nonspecific ST-T wave changes since prior EKG.  Radiology CT Head Wo Contrast  Result Date: 11/04/2020 CLINICAL DATA:  45 year old female with seizure. EXAM: CT HEAD WITHOUT CONTRAST TECHNIQUE: Contiguous axial images were obtained from the base of the skull through the vertex without intravenous contrast. COMPARISON:  Head CT dated 05/02/2020. FINDINGS: Brain: No evidence of acute infarction, hemorrhage, hydrocephalus, extra-axial collection or mass lesion/mass effect. Vascular: No hyperdense vessel or unexpected calcification. Skull: Normal. Negative for fracture or focal lesion. Sinuses/Orbits: No acute finding. Other: None IMPRESSION: Normal noncontrast CT of the brain. Electronically Signed   By: Anner Crete M.D.   On: 11/04/2020 23:15    Procedures Procedures   Medications Ordered in ED Medications  LORazepam (ATIVAN) 2 MG/ML injection ( Injection Given 11/04/20 2215)  ondansetron (ZOFRAN) injection 4 mg (4 mg Intravenous Given 11/04/20 2217)  levETIRAcetam (KEPPRA) IVPB 1000 mg/100 mL premix (0 mg Intravenous Stopped 11/04/20 2310)    Followed by  levETIRAcetam (KEPPRA) IVPB 1000 mg/100 mL premix (0 mg Intravenous Stopped 11/04/20 2242)    ED Course  I have reviewed the triage vital signs and the nursing  notes.  Pertinent labs & imaging results that were available during my care of the patient were reviewed by me and considered in my medical decision making (see chart for details).    MDM Rules/Calculators/A&P                          Patient presents with possible seizure activity.  History of both seizures and nonepileptic seizures.  Reportedly has been drinking alcohol.  Lab work still pending.  Has had numerous episodes in the ER.  They are brief.  At times she will look at me but also at times would have deviated eyes.  Later had an episode where she was holding onto the railing with some mild shaking.  I told the nurse that grabbing the railing is not a sign of a seizure.  The patient then let go of the railing.  I told the nurse that we need to get the CT scan before more medicines would be given and the patient then began to lay back more. Reviewing notes it appears patient has a positive Covid test on January 12 with symptoms that started January 10.  Patient is reportedly vaccinated.  I have discussed with neurology.  Recommended admission to the hospital for monitoring.  Can get EEG tomorrow if needed.  These activities do not appear to be active epileptic seizures.  However does have mental status change.  Discussed with Dr. Fabio Neighbors.  Will evaluate patient in ER  Final Clinical Impression(s) / ED Diagnoses Final diagnoses:  Seizure-like activity [facility])    Rx / [place] Orders ED Discharge Orders    None       Davonna Belling, MD 11/04/20 2321    Davonna Belling, MD 11/04/20 2325

## 2020-11-04 NOTE — ED Notes (Addendum)
Pt having seizure like activity lasting 5 seocnds, 2mg  ativan Im by this rn.

## 2020-11-04 NOTE — ED Triage Notes (Signed)
Patient reports seizures x2 this evening after drinking alcohol . Alert and oriented /respirations unlabored , denies injury.

## 2020-11-04 NOTE — ED Notes (Signed)
Seizure occurrence: 15 seconds, shaking, rigidity, tachy to 150bpm

## 2020-11-04 NOTE — ED Notes (Addendum)
md at bedside, pt having another seizure episode. Placed on monitor and NRB, seizure precautions in place, suction set up, Side rails padded and up. Bed locked and lowest position.

## 2020-11-05 DIAGNOSIS — R569 Unspecified convulsions: Secondary | ICD-10-CM

## 2020-11-05 NOTE — Consult Note (Signed)
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 female with PMH significant for anxiety, depression, DM2, polysubstance abuse, EtOH use, prior hx of seizures on Keppra 1000mg  BID who presents with 2 seizures events after drinking alcohol. In the ED she had several brief episodes of shaking that were witnessed by the ED provider. The description of the event includes brief 5-15 secs bilateral shaking/tightness with shaking her head sideways. She has been seen in the ED several times in the past for seizure like events. Per notes, during one of the events, she appeared to look at the ED provider and follow somewhat during the activity. Also per ED provider notes, Later had an episode where she was holding onto the railing with some mild shaking.  I told the nurse that grabbing the railing is not a sign of a seizure.  The patient then let go of the railing.  I told the nurse that we need to get the CT scan before more medicines would be given and the patient then began to lay back more.  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  Per prior ED notes from Oct 2020: "was found shaking on the bathroom floor and brought emergently to the trauma bay for evaluation.  The patient states that she is "having a seizure."  After significant prompting to speak to me even though she remained shaking the patient patient states that she is a "sugar diabetic."  She states that she went out drinking with her friends last night.  She had "a lot" to drink."   Prior ED notes from July 12th, 2020: "Pt with ?hx seizures, presents with brief shaking episode. In ED  patient with a couple similar episodes, lasting seconds, generalized full body shaking with rolling eyes upwards, or to right, no postictal period, no incontinence. Pt able to be aroused from shaking episode, at which time states sometimes it happens when I have anxiety or panic attack."  She was given Keppra 2000mg  along with Ativan 2mg  IV once. On my evaluation, she is post ictal and unable to provide any meaningful hx.   ROS  Unable to obtain due to somnolence. Past History   Past Medical History:  Diagnosis Date  . Anxiety and depression   . Diabetes mellitus without complication ([facility])   . Right flank pain 05/2019  . Right upper quadrant pain 8/22020  . Seizures ([facility]) 2015  . Vitamin D deficiency 05/2019   Past Surgical History:  Procedure Laterality Date  . ABDOMINAL HYSTERECTOMY    . TUBAL LIGATION     Family History  Problem Relation Age of Onset  . Seizures Daughter   . Asthma Son   . Hypertension Father   . Arrhythmia Father        Status post pacemaker insertion  . Hypertension Mother   . Diabetes Mother    Social History   Socioeconomic History  . Marital status: Single    Spouse name: Not on file  . Number of children: Not on file  . Years of education: Not on file  .  Highest education level: Not on file  Occupational History  . Not on file  Tobacco Use  . Smoking status: Never Smoker  . Smokeless tobacco: Never Used  Vaping Use  . Vaping Use: Never used  Substance and Sexual Activity  . Alcohol use: Yes    Alcohol/week: 0.0 standard drinks    Comment: Occasional social  . Drug use: No  . Sexual activity: Not on file  Other Topics Concern  . Not on file  Social History Narrative  . Not on file   Social Determinants of Health   Financial Resource Strain: Not on file  Food Insecurity: Not on file  Transportation Needs: Not on file  Physical Activity: Not on file  Stress: Not on file  Social Connections: Not on file   Allergies  Allergen  Reactions  . Tramadol Itching    Vaginal itching, per patient's original chart  . Acetaminophen Rash    Medications  (Not in a hospital admission)    Vitals   Vitals:   11/04/20 2218 11/04/20 2310 11/04/20 2330 11/05/20 0000  BP: (!) 136/98 119/68 (!) 105/54 (!) 102/59  Pulse: (!) 123 98 84 81  Resp: (!) 25 (!) 21 18 16   Temp:      TempSrc:      SpO2: 100% 100% 100% 100%  Weight:      Height:         Body mass index is 23.27 kg/m.  Physical Exam   General: Laying comfortably in bed; in no acute distress. HENT: Normal oropharynx and mucosa. Normal external appearance of ears and nose. Neck: Supple, no pain or tenderness CV: No JVD. No peripheral edema.  Pulmonary: Symmetric Chest rise. Normal respiratory effort.  Abdomen: Soft to touch, non-tender.  Ext: No cyanosis, edema, or deformity  Skin: No rash. Normal palpation of skin.   Musculoskeletal: Normal digits and nails by inspection. No clubbing.   Neurologic Examination  Mental status/Cognition: opens eye partially to sternal rub, briefly regard my face and then goes back to sleep. Does give a thumbs up. Speech/language: Somnolent, no speech produced. Cranial nerves:   CN II Pupils equal and reactive to light, no VF deficits   CN III,IV,VI EOM intact, no gaze preference or deviation, no nystagmus   CN V    CN VII Symmetric facial grimace to pain   CN VIII normal hearing to speech   CN IX & X    CN XI    CN XII    Motor:  Muscle bulk: normal. Normal tone. Moves all extremities spontaneously and BL hand grip strength in 5/5.  Reflexes: 2 and symmetric throughout  Sensation:  Light touch Intact throughout   Pin prick    Temperature    Vibration   Proprioception    Coordination/Complex Motor:  Difficult to assess with somnolence, but no obvious ataxia.  Labs   CBC:  Recent Labs  Lab 11/04/20 2135  WBC 5.8  NEUTROABS 3.5  HGB 13.7  HCT 42.5  MCV 95.1  PLT 212    Basic Metabolic Panel:   Lab Results  Component Value Date   NA 137 11/04/2020   K 3.6 11/04/2020   CO2 20 (L) 11/04/2020   GLUCOSE 109 (H) 11/04/2020   BUN 10 11/04/2020   CREATININE 0.99 11/04/2020   CALCIUM 9.6 11/04/2020   GFRNONAA >60 11/04/2020   GFRAA >60 04/25/2020   Lipid Panel:  Lab Results  Component Value Date   LDLCALC 108 (H) 05/13/2019  HgbA1c:  Lab Results  Component Value Date   HGBA1C 5.6 06/27/2019   Urine Drug Screen:     Component Value Date/Time   LABOPIA NONE DETECTED 11/04/2020 2235   COCAINSCRNUR POSITIVE (A) 11/04/2020 2235   LABBENZ NONE DETECTED 11/04/2020 2235   AMPHETMU NONE DETECTED 11/04/2020 2235   THCU NONE DETECTED 11/04/2020 2235   LABBARB NONE DETECTED 11/04/2020 2235    Alcohol Level     Component Value Date/Time   ETH 128 (H) 11/04/2020 2134    CT Head without contrast: CTH was negative for a large hypodensity concerning for a large territory infarct or hyperdensity concerning for an ICH  Labs: COVID Positive EtOH levels: 128. UDS positive for cocaine.  Impression   Devonna Denithe Rodenbaugh is a 45 y.o. female with PMH significant for anxiety, depression, DM2, polysubstance abuse, EtOH use, prior hx of seizures on Keppra 1000mg  BID who presents with seizure like episodes in the setting of elevated EtOH levels, and positive cocaine on UDS. 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  Even if the presenting events were seizures, they were likely provoked in the setting of her COVID + and Cocaine use.  Recommendations  - Continue Keppra 1000mg  BID - Follow up with her outpatient neurologist. ______________________________________________________________________   Thank you for the opportunity to take part in the care of this patient. If you have any further questions, please contact the neurology consultation attending.  Signed,  [facility] Pager  Number _ _ _   _ __   _ __ _ _  __ __   _ __   __ _

## 2020-11-05 NOTE — ED Provider Notes (Signed)
I assumed care of this patient.  Please see previous provider note for further details of Hx, PE.  Briefly patient is a 45 y.o. female who presented here for possible seizures. 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  And patient was loaded on Keppra.  neurology consulted to evaluate the patient.  Initially recommended admitting for EEG.  Hospitalist evaluated the patient given the documented nonepileptic seizures had a discussion with neurology.  Ultimately decided to monitor the patient till morning to see if she metabolized and went back to normal mental state.  Patient is now awake alert ambulating without complication.  Cleared for discharge by hospitalist and neurology      Nira Conn, MD 11/05/20 [phone]

## 2020-11-05 NOTE — ED Notes (Signed)
Pt easily arousable but continues to be have intoxication, pt slurring words and disoriented on our location. Pt updated there are plans to be discharged.

## 2020-11-08 ENCOUNTER — Telehealth: Payer: Self-pay | Admitting: *Deleted

## 2020-11-08 NOTE — Telephone Encounter (Signed)
Transition Care Management Follow-up Telephone Call  Date of discharge and from where: 11/05/2020 - [facility]  How have you been since you were released from the hospital? Pt was very confused  Any questions or concerns? No  Items Reviewed:  Did the pt receive and understand the discharge instructions provided? Yes   Medications obtained and verified? Yes   Other? N/A  Any new allergies since your discharge? No   Dietary orders reviewed? Yes  Do you have support at home? Yes   Home Care and Equipment/Supplies: Were home health services ordered? not applicable If so, what is the name of the agency? N/A  Has the agency set up a time to come to the patient's home? not applicable Were any new equipment or medical supplies ordered?  No What is the name of the medical supply agency? N/A Were you able to get the supplies/equipment? not applicable Do you have any questions related to the use of the equipment or supplies? No  Functional Questionnaire: (I = Independent and D = Dependent) ADLs: I  Bathing/Dressing- I  Meal Prep- I  Eating- I  Maintaining continence- I  Transferring/Ambulation- I  Managing Meds- I  Follow up appointments reviewed:   PCP Hospital f/u appt confirmed? No    Specialist Hospital f/u appt confirmed? No    Are transportation arrangements needed? No   If their condition worsens, is the pt aware to call PCP or go to the Emergency Dept.? Yes  Was the patient provided with contact information for the PCP's office or ED? Yes  Was to pt encouraged to call back with questions or concerns? Yes

## 2020-11-26 ENCOUNTER — Encounter (HOSPITAL_COMMUNITY): Payer: Self-pay | Admitting: Emergency Medicine

## 2020-11-26 ENCOUNTER — Emergency Department (HOSPITAL_COMMUNITY)
Admission: EM | Admit: 2020-11-26 | Discharge: 2020-11-27 | Disposition: A | Discharge status: Home / Self Care | Payer: Medicaid Other | Attending: Emergency Medicine | Admitting: Emergency Medicine

## 2020-11-26 ENCOUNTER — Other Ambulatory Visit: Payer: Self-pay

## 2020-11-26 DIAGNOSIS — F419 Anxiety disorder, unspecified: Secondary | ICD-10-CM | POA: Insufficient documentation

## 2020-11-26 DIAGNOSIS — R569 Unspecified convulsions: Secondary | ICD-10-CM

## 2020-11-26 DIAGNOSIS — F1020 Alcohol dependence, uncomplicated: Secondary | ICD-10-CM

## 2020-11-26 DIAGNOSIS — Z7984 Long term (current) use of oral hypoglycemic drugs: Secondary | ICD-10-CM | POA: Diagnosis not present

## 2020-11-26 DIAGNOSIS — R69 Illness, unspecified: Secondary | ICD-10-CM | POA: Insufficient documentation

## 2020-11-26 DIAGNOSIS — F10288 Alcohol dependence with other alcohol-induced disorder: Secondary | ICD-10-CM | POA: Insufficient documentation

## 2020-11-26 DIAGNOSIS — F1414 Cocaine abuse with cocaine-induced mood disorder: Secondary | ICD-10-CM | POA: Diagnosis not present

## 2020-11-26 DIAGNOSIS — F329 Major depressive disorder, single episode, unspecified: Secondary | ICD-10-CM | POA: Insufficient documentation

## 2020-11-26 DIAGNOSIS — E11649 Type 2 diabetes mellitus with hypoglycemia without coma: Secondary | ICD-10-CM | POA: Diagnosis not present

## 2020-11-26 DIAGNOSIS — F1410 Cocaine abuse, uncomplicated: Secondary | ICD-10-CM | POA: Insufficient documentation

## 2020-11-26 DIAGNOSIS — E162 Hypoglycemia, unspecified: Secondary | ICD-10-CM

## 2020-11-26 DIAGNOSIS — G40909 Epilepsy, unspecified, not intractable, without status epilepticus: Secondary | ICD-10-CM | POA: Diagnosis not present

## 2020-11-26 DIAGNOSIS — Y905 Blood alcohol level of 100-119 mg/100 ml: Secondary | ICD-10-CM | POA: Insufficient documentation

## 2020-11-26 DIAGNOSIS — Z20822 Contact with and (suspected) exposure to covid-19: Secondary | ICD-10-CM | POA: Diagnosis not present

## 2020-11-26 LAB — URINALYSIS, DIPSTICK ONLY
Bilirubin Urine: NEGATIVE
Glucose, UA: NEGATIVE mg/dL
Ketones, ur: NEGATIVE mg/dL
Leukocytes,Ua: NEGATIVE
Nitrite: NEGATIVE
Protein, ur: NEGATIVE mg/dL
Specific Gravity, Urine: 1.003 — ABNORMAL LOW (ref 1.005–1.030)
pH: 5 (ref 5.0–8.0)

## 2020-11-26 LAB — ACETAMINOPHEN LEVEL: Acetaminophen (Tylenol), Serum: 18 ug/mL (ref 10–30)

## 2020-11-26 LAB — CBC WITH DIFFERENTIAL/PLATELET
Abs Immature Granulocytes: 0.01 10*3/uL (ref 0.00–0.07)
Basophils Absolute: 0 10*3/uL (ref 0.0–0.1)
Basophils Relative: 1 %
Eosinophils Absolute: 0 10*3/uL (ref 0.0–0.5)
Eosinophils Relative: 1 %
HCT: 39.2 % (ref 36.0–46.0)
Hemoglobin: 13 g/dL (ref 12.0–15.0)
Immature Granulocytes: 0 %
Lymphocytes Relative: 37 %
Lymphs Abs: 1.5 10*3/uL (ref 0.7–4.0)
MCH: 31.4 pg (ref 26.0–34.0)
MCHC: 33.2 g/dL (ref 30.0–36.0)
MCV: 94.7 fL (ref 80.0–100.0)
Monocytes Absolute: 0.3 10*3/uL (ref 0.1–1.0)
Monocytes Relative: 7 %
Neutro Abs: 2.2 10*3/uL (ref 1.7–7.7)
Neutrophils Relative %: 54 %
Platelets: 197 10*3/uL (ref 150–400)
RBC: 4.14 MIL/uL (ref 3.87–5.11)
RDW: 13.5 % (ref 11.5–15.5)
WBC: 4 10*3/uL (ref 4.0–10.5)
nRBC: 0 % (ref 0.0–0.2)

## 2020-11-26 LAB — CBG MONITORING, ED
Glucose-Capillary: 60 mg/dL — ABNORMAL LOW (ref 70–99)
Glucose-Capillary: 51 mg/dL — ABNORMAL LOW (ref 70–99)
Glucose-Capillary: 156 mg/dL — ABNORMAL HIGH (ref 70–99)

## 2020-11-26 LAB — RAPID URINE DRUG SCREEN, HOSP PERFORMED
Amphetamines: NOT DETECTED
Barbiturates: NOT DETECTED
Benzodiazepines: POSITIVE — AB
Cocaine: POSITIVE — AB
Opiates: NOT DETECTED
Tetrahydrocannabinol: NOT DETECTED

## 2020-11-26 LAB — COMPREHENSIVE METABOLIC PANEL
ALT: 21 U/L (ref 0–44)
AST: 25 U/L (ref 15–41)
Albumin: 4.7 g/dL (ref 3.5–5.0)
Alkaline Phosphatase: 59 U/L (ref 38–126)
Anion gap: 10 (ref 5–15)
BUN: 9 mg/dL (ref 6–20)
CO2: 21 mmol/L — ABNORMAL LOW (ref 22–32)
Calcium: 9.3 mg/dL (ref 8.9–10.3)
Chloride: 109 mmol/L (ref 98–111)
Creatinine, Ser: 0.86 mg/dL (ref 0.44–1.00)
GFR, Estimated: >60 mL/min (ref >60)
Glucose, Bld: 85 mg/dL (ref 70–99)
Potassium: 3.6 mmol/L (ref 3.5–5.1)
Sodium: 140 mmol/L (ref 135–145)
Total Bilirubin: 0.7 mg/dL (ref 0.3–1.2)
Total Protein: 8.5 g/dL — ABNORMAL HIGH (ref 6.5–8.1)

## 2020-11-26 LAB — SALICYLATE LEVEL: Salicylate Lvl: <7 mg/dL — ABNORMAL LOW (ref 7.0–30.0)

## 2020-11-26 LAB — I-STAT BETA HCG BLOOD, ED (MC, WL, AP ONLY): I-stat hCG, quantitative: <5 m[IU]/mL (ref <5)

## 2020-11-26 LAB — ETHANOL: Alcohol, Ethyl (B): 118 mg/dL — ABNORMAL HIGH (ref <10)

## 2020-11-26 MED ORDER — DEXTROSE 10 % IV SOLN: INTRAVENOUS | Status: DC

## 2020-11-26 MED ORDER — DEXTROSE 50 % IV SOLN
50.0000 mL | Freq: Once | INTRAVENOUS | Status: AC
  Administered 2020-11-26: 50 mL via INTRAVENOUS
  Filled 2020-11-26: qty 50

## 2020-11-26 MED ORDER — LEVETIRACETAM IN NACL 1000 MG/100ML IV SOLN
1000.0000 mg | Freq: Once | INTRAVENOUS | Status: AC
  Administered 2020-11-26: 1000 mg via INTRAVENOUS
  Filled 2020-11-26: qty 100

## 2020-11-26 NOTE — ED Provider Notes (Signed)
[facility]-EMERGENCY DEPT Provider Note   CSN: [id number] Arrival date & time: 11/26/20  1903     History Chief Complaint  Patient presents with  . Seizures    Jennifer Crawford is a 45 y.o. female.  45 yo F with a cc of possible seizure-like activity.  Patient has a history of seizures.  She was given 5 mg IM Versed for seizure-like activity by EMS.  Shaking has completely resolved upon arrival to the ED.  Patient is nonverbal on arrival.  Level 5 caveat.  The history is provided by the patient.  Illness Severity:  Mild Onset quality:  Sudden Duration:  1 day Timing:  Constant Progression:  Worsening Chronicity:  New      Past Medical History:  Diagnosis Date  . Anxiety and depression   . Diabetes mellitus without complication (HCC)   . Right flank pain 05/2019  . Right upper quadrant pain 8/22020  . Seizures (HCC) 2015  . Vitamin D deficiency 05/2019    Patient Active Problem List   Diagnosis Date Noted  . Hypoglycemia 06/27/2019  . Hematuria 06/27/2019  . Low back pain with sciatica 05/31/2019  . Left flank pain 05/31/2019  . Intractable headache 05/13/2019  . Right upper quadrant pain 05/13/2019  . Right flank pain 05/13/2019  . Diabetes mellitus type 2 in nonobese (HCC) 06/20/2015  . Elevated blood alcohol level 06/20/2015  . Anxiety and depression 06/20/2015  . Seizure disorder (HCC) 06/19/2015    Past Surgical History:  Procedure Laterality Date  . ABDOMINAL HYSTERECTOMY    . TUBAL LIGATION       OB History   No obstetric history on file.     Family History  Problem Relation Age of Onset  . Seizures Daughter   . Asthma Son   . Hypertension Father   . Arrhythmia Father        Status post pacemaker insertion  . Hypertension Mother   . Diabetes Mother     Social History   Tobacco Use  . Smoking status: Never Smoker  . Smokeless tobacco: Never Used  Vaping Use  . Vaping Use: Never used  Substance Use  Topics  . Alcohol use: Yes    Alcohol/week: 0.0 standard drinks    Comment: Occasional social  . Drug use: No    Home Medications Prior to Admission medications   Medication Sig Start Date End Date Taking? Authorizing Provider  albuterol (PROVENTIL) (2.5 MG/3ML) 0.083% nebulizer solution Take 3 mLs (2.5 mg total) by nebulization every 6 (six) hours as needed for wheezing or shortness of breath. 04/04/20   Sheldon, Charles B, MD  albuterol (VENTOLIN HFA) 108 (90 Base) MCG/ACT inhaler Inhale 2 puffs into the lungs every 6 (six) hours as needed for wheezing or shortness of breath. Patient not taking: Reported on 04/04/2020    [provider]  albuterol (VENTOLIN HFA) 108 (90 Base) MCG/ACT inhaler Inhale 2 puffs into the lungs every 4 (four) hours as needed for wheezing or shortness of breath. 02/10/20   Harris, Abigail, PA-C  blood glucose meter kit and supplies Dispense based on patient and insurance preference. Use up to four times daily as directed. (FOR ICD-10 E10.9, E11.9). 05/13/19   Stroud, Natalie M, FNP  Budesonide (PULMICORT FLEXHALER) 90 MCG/ACT inhaler Inhale 1 puff into the lungs 2 (two) times daily. Rinse mouth with water and spit after every use. 10/13/20   Burky, Natalie B, NP  ibuprofen (ADVIL) 800 MG tablet Take 1   tablet (800 mg total) by mouth 3 (three) times daily. 08/05/20   Goldston, Scott, MD  levETIRAcetam (KEPPRA) 1000 MG tablet Take 1 tablet (1,000 mg total) by mouth 2 (two) times daily. 04/25/20   Sanders, Lisa M, PA-C  METFORMIN HCL PO Take 1 tablet by mouth 2 (two) times daily with a meal.    [provider]    Allergies    Tramadol and Acetaminophen  Review of Systems   Review of Systems  Physical Exam Updated Vital Signs BP 103/70   Pulse 83   Temp 98.3 F (36.8 C) (Oral)   Resp 19   SpO2 100%   Physical Exam Vitals and nursing note reviewed.  Constitutional:      General: She is not in acute distress.    Appearance: She is well-developed  and well-nourished. She is not diaphoretic.  HENT:     Head: Normocephalic and atraumatic.  Eyes:     Extraocular Movements: EOM normal.     Pupils: Pupils are equal, round, and reactive to light.  Cardiovascular:     Rate and Rhythm: Normal rate and regular rhythm.     Heart sounds: No murmur heard. No friction rub. No gallop.   Pulmonary:     Effort: Pulmonary effort is normal.     Breath sounds: No wheezing or rales.  Abdominal:     General: There is no distension.     Palpations: Abdomen is soft.     Tenderness: There is no abdominal tenderness.  Musculoskeletal:        General: No tenderness or edema.     Cervical back: Normal range of motion and neck supple.  Skin:    General: Skin is warm and dry.  Neurological:     Comments: Localizes to pain  Psychiatric:        Mood and Affect: Mood and affect normal.     ED Results / Procedures / Treatments   Labs (all labs ordered are listed, but only abnormal results are displayed) Labs Reviewed  URINALYSIS, DIPSTICK ONLY - Abnormal; Notable for the following components:      Result Value   Color, Urine STRAW (*)    Specific Gravity, Urine 1.003 (*)    Hgb urine dipstick SMALL (*)    All other components within normal limits  COMPREHENSIVE METABOLIC PANEL - Abnormal; Notable for the following components:   CO2 21 (*)    Total Protein 8.5 (*)    All other components within normal limits  ETHANOL - Abnormal; Notable for the following components:   Alcohol, Ethyl (B) 118 (*)    All other components within normal limits  RAPID URINE DRUG SCREEN, HOSP PERFORMED - Abnormal; Notable for the following components:   Cocaine POSITIVE (*)    Benzodiazepines POSITIVE (*)    All other components within normal limits  SALICYLATE LEVEL - Abnormal; Notable for the following components:   Salicylate Lvl <7.0 (*)    All other components within normal limits  CBG MONITORING, ED - Abnormal; Notable for the following components:    Glucose-Capillary 60 (*)    All other components within normal limits  CBG MONITORING, ED - Abnormal; Notable for the following components:   Glucose-Capillary 51 (*)    All other components within normal limits  CBG MONITORING, ED - Abnormal; Notable for the following components:   Glucose-Capillary 156 (*)    All other components within normal limits  RESP PANEL BY RT-PCR (FLU A&B, COVID) [id number]    CBC WITH DIFFERENTIAL/PLATELET  ACETAMINOPHEN LEVEL  I-STAT BETA HCG BLOOD, ED ([facility], [facility], [facility] ONLY)    EKG EKG Interpretation  Date/Time:  Friday November 26 2020 19:42:48 EST Ventricular Rate:  97 PR Interval:    QRS Duration: 93 QT Interval:  541 QTC Calculation: 688 R Axis:   59 Text Interpretation: Sinus rhythm Biatrial enlargement Probable LVH with secondary repol abnrm Prolonged QT interval No significant change since last tracing Confirmed by Deno Etienne [phone]) on 11/26/2020 8:20:26 PM   Radiology No results found.  Procedures Procedures   Medications Ordered in ED Medications  levETIRAcetam (KEPPRA) IVPB 1000 mg/100 mL premix (0 mg Intravenous Stopped 11/26/20 2108)  dextrose 50 % solution 50 mL (50 mLs Intravenous Given 11/26/20 2006)  dextrose 50 % solution 50 mL (50 mLs Intravenous Given 11/26/20 2202)    ED Course  I have reviewed the triage vital signs and the nursing notes.  Pertinent labs & imaging results that were available during my care of the patient were reviewed by me and considered in my medical decision making (see chart for details).    MDM Rules/Calculators/A&P                          45 yo F with a chief complaints of seizure-like activity.  Patient has a history of this in the past.  Has been admitted recently at [facility] with a nondiagnostic EKG.  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  Given 5 mg of Versed in route here.  Will obtain a laboratory evaluation.  Loaded with Keppra as there was some question of compliance.   She does have a history of alcohol and cocaine abuse.  Further history was obtained and there is some concern that the patient had intentionally overdosed to take her own life.  There were bottles of alcohol and pills strewn about her.  Family is unsure what exact medicine she took.  I was called to the bedside after the patient had reportedly had a seizure.  At this time her activity had ceased.  Patient responded to pain and set up rides and then moved to a different position and then proceeded to have generalized shaking which was again broken with painful stimuli.  Patient had persistently low blood sugar.  Was given a second bolus of D50.  After which the patient spontaneously decided to wake up and have something to eat and drink.  Now much more alert.  Feel she is medically clear.  TTS evaluation.  CRITICAL CARE Performed by: Cecilio Asper   Total critical care time: 35 minutes  Critical care time was exclusive of separately billable procedures and treating other patients.  Critical care was necessary to treat or prevent imminent or life-threatening deterioration.  Critical care was time spent personally by me on the following activities: development of treatment plan with patient and/or surrogate as well as nursing, discussions with consultants, evaluation of patient's response to treatment, examination of patient, obtaining history from patient or surrogate, ordering and performing treatments and interventions, ordering and review of laboratory studies, ordering and review of radiographic studies, pulse oximetry and re-evaluation of patient's condition.  The patients results and plan were reviewed and discussed.   Any x-rays performed were independently reviewed by myself.   Differential diagnosis were considered with the presenting HPI.  Medications  levETIRAcetam (KEPPRA) IVPB 1000 mg/100 mL premix (0 mg Intravenous Stopped 11/26/20 2108)  dextrose 50 %  solution 50 mL (50  mLs Intravenous Given 11/26/20 2006)  dextrose 50 % solution 50 mL (50 mLs Intravenous Given 11/26/20 2202)    Vitals:   11/26/20 1929 11/26/20 2030 11/26/20 2045 11/26/20 2215  BP: 119/72 124/86 106/80 103/70  Pulse: 93 86 80 83  Resp: (!) 23 (!) 21 (!) 25 19  Temp: 98.3 F (36.8 C)     TempSrc: Oral     SpO2: 100% 98% 97% 100%    Final diagnoses:  Seizure-like activity (HCC)  Hypoglycemia    Admission/ observation were discussed with the admitting physician, patient and/or family and they are comfortable with the plan.    Final Clinical Impression(s) / ED Diagnoses Final diagnoses:  Seizure-like activity (HCC)  Hypoglycemia    Rx / DC Orders ED Discharge Orders    None       , , DO 11/26/20 2233  

## 2020-11-26 NOTE — ED Notes (Signed)
Pt actively seizing pts mother at bedside. Seizure last approximately one minute

## 2020-11-26 NOTE — ED Triage Notes (Signed)
45 yo female BIBA s/p seizures at home. Per EMS pt has history of seizure and is on medications but family is unsure as to whether or not she is complaint with meds. Per EMS pt had 4 seizures and had one active seizure while en route. Pt was given 5mg  midazolam IM @ 18:27  Vitals 114/74 Hr 94 rr 14 spo2 97% ra cbg 139 per EMS

## 2020-11-26 NOTE — ED Notes (Signed)
Per pts sister, pt has history of drug and alcohol abuse and history of ETOH induced seizures. Pts sister states "I believe that my sister may have been drinking today."

## 2020-11-26 NOTE — ED Notes (Signed)
Elberta Spaniel, sister, [phone], would like to be contacted if needed for anything.

## 2020-11-27 DIAGNOSIS — F1020 Alcohol dependence, uncomplicated: Secondary | ICD-10-CM

## 2020-11-27 DIAGNOSIS — F1414 Cocaine abuse with cocaine-induced mood disorder: Secondary | ICD-10-CM

## 2020-11-27 LAB — RESP PANEL BY RT-PCR (FLU A&B, COVID) ARPGX2
Influenza A by PCR: NEGATIVE
Influenza B by PCR: NEGATIVE
SARS Coronavirus 2 by RT PCR: NEGATIVE

## 2020-11-27 MED ORDER — ALBUTEROL SULFATE HFA 108 (90 BASE) MCG/ACT IN AERS: 2.0000 | INHALATION_SPRAY | Freq: Four times a day (QID) | RESPIRATORY_TRACT | Status: DC | PRN

## 2020-11-27 MED ORDER — METFORMIN HCL 500 MG PO TABS
500.0000 mg | ORAL_TABLET | Freq: Two times a day (BID) | ORAL | Status: DC
  Administered 2020-11-27: 500 mg via ORAL
  Filled 2020-11-27: qty 1

## 2020-11-27 MED ORDER — LEVETIRACETAM 500 MG PO TABS
1000.0000 mg | ORAL_TABLET | Freq: Two times a day (BID) | ORAL | Status: DC
  Administered 2020-11-27: 1000 mg via ORAL
  Filled 2020-11-27: qty 2

## 2020-11-27 MED ORDER — BUDESONIDE 90 MCG/ACT IN AEPB: 90.0000 ug | INHALATION_SPRAY | Freq: Two times a day (BID) | RESPIRATORY_TRACT | Status: DC

## 2020-11-27 NOTE — Progress Notes (Signed)
Per Ophelia Shoulder NP, pt is psych cleared. Peer support ordered.  CSW provided the following outpatient resources/shelter information/IRC information for pt:    [facility]-will provide timely access to mental health services for children and adolescents (4-17) and adults presenting in a mental health crisis. The program is designed for those who need urgent Behavioral Health or Substance Use treatment and are not experiencing a medical crisis that would typically require an emergency room visit.   [address] [place], [place] [address] Phone: [phone] [email or web address]  [facility] will also offer the following outpatient services: (Monday through Friday 8am-5pm)  . Partial Hospitalization Program (PHP) . Substance Abuse Intensive Outpatient Program (SA-IOP) . Group Therapy . Medication Management . Peer Living Room  We also provide (24/7):   Assessments: Our mental health clinician and providers will conduct a focused mental health evaluation, assessing for immediate safety concerns and further mental health needs.  Referral: Our team will provide resources and help connect to community based mental health treatment, when indicated, including psychotherapy, psychiatry, and other specialized behavioral health or substance use disorder services (for those not already in treatment).  Transitional Care: Our team providers in person bridging and/or telphonic follow-up during the patient's transition to outpatient services.    [facility] [place] [facility]RESOURCES [address] [place], [place] [address] PHONE: [phone] Hours: Monday-Friday 8:00AM-3:00PM  This agency offers the following services for free:   *Integrated Care -Case management -[facility] [employer] -Medical clinic -Mental health nurse -Referrals  *Fundamental Services -Showers and hygiene supplies -Laundry -Phone  access -Mailing addresses and mailboxes -Replacement IDs -Onsite barbershop -Storage lockers -[employer] winter warming center  *Engineer, drilling -Skilled trade classes -Job skills classes -Resume and jobs application assistance -Interview training -GED classes -Environmental health practitioner -Warden/ranger   Homeless Shelter List:   Health and safety inspector [facility] [place]) [address] [place], [place] Phone: [phone]   [facility] has been providing emergency shelter to those in need of a permanent residence for over 35 years. The [employer] shelter plays an important role in our community.    There are many life events that can pull someone into a downward spiral towards poverty that is very difficult to get out of. Homelessness is a problem that can affect anyone of [place]. [employer] is a safe and comforting place to stay, especially if you have experienced the hardship of street life.    [facility] [facility] provides a single bed and bedding to 100 adult men and women. The shelter welcomes all who are in need of housing, no one in real need is turned away unless space is not available.    While staying at [facility], guests are offered more than just a bed for a night. Hot meals are provided and every guest has access to case management services. Case managers provide assistance with finding housing, employment, or other services that will help them gain stability. Continuous stay is based on availability, capacity, and progress towards goals.    To contact the front desk of [facility] please call   [phone] ext [phone] or ext. [phone].   [facility] [address] [place], [place] [address] Phone: [phone]   [facility] (Women only) [address] [place], [place] [address] Phone: [phone]   [facility] [address], [place] [address] Phone:  [phone]   [facility]: [address] [place], [place] [address] Phone: [phone]   [facility] [address] [place], [place] [address] Check in at 6:00PM for placement at a local shelter) Phone: [phone]   Substance abuse resources and Residential Options:  [facility]-14 day residential substance abuse facility (not an option if you have active assault charges). [address], [place], [place] [address] Phone: [phone]: Ask for Drema Pry in admissions to complete intake if interested in pursuing this option.  [facility]: Can get intake scheduled; (not an option if you have active assault charges). [address] [place], [place] [phone]) Call Mon-Fri.  [facility] ([facility]): (offers outpatient therapy and intensive outpatient substance abuse therapy).  [address], [place], [place] [address] Phone: [phone]  [facility]: Offers FREE recovery skills classes, support groups, 1:1 Peer Support, and Compeer Classes. [address], [place], [place] [address] Phone: [phone] (Call to complete intake).   [facility] [address] [place], [place] [address] Phone: [phone] ext [phone]  The [facility] provides food, shelter and other programs and services to the homeless men of -[place] through our [employer] program.  By offering safe shelter, three meals a day, clean clothing, Biblical counseling, financial planning, vocational training, GED/education and employment assistance, we've helped mend the shattered lives of many homeless men since opening in 1974.  We have approximately 267 beds available, with a max of 312 beds including mats for emergency situations and currently house an average of 270 men a night.  Prospective Client Check-In Information Photo ID Required (State/ Out of State/ [facility]) - if photo ID is  not available, clients are required to have a printout of a police/sheriff's criminal history report. Help out with chores around the Mission. No sex offender of any type (pending, charged, registered and/or any other sex related offenses) will be permitted to check in. Must be willing to abide by all rules, regulations, and policies established by the [employer]. The following will be provided - shelter, food, clothing, and biblical counseling. If you or someone you know is in need of assistance at our [facility] shelter in [place], [place], please call [phone] ext. [phone].   Heather S. Alan Ripper, MSW, LCSW Clinical Social Worker 11/27/2020 12:15 PM

## 2020-11-27 NOTE — ED Notes (Signed)
Patient sleeping rise and fall of chest breathing observed  Sitter at bedside  

## 2020-11-27 NOTE — ED Provider Notes (Signed)
  Physical Exam  BP (!) 99/58 (BP Location: Left Arm)   Pulse 65   Temp 98.3 F (36.8 C) (Oral)   Resp 16   SpO2 100%   Physical Exam  ED Course/Procedures     Procedures  MDM  Please see previous notes for history, physical and care.  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  She was given seizure medications, and noted to have a seizure which was most consistent with a nonepileptic seizure while she was in the emergency department.  Ordered home medications, Inc. including continue seizure medications.  She is medically cleared and awaiting TTS/placement.  No events overnight per nursing.  No vital sign changes to suggest alcohol withdrawal and no reported seizures overnight. She is sleeping comfortably at time of my exam.        Alvira Monday, MD 11/28/20 [phone]

## 2020-11-27 NOTE — Discharge Instructions (Signed)
[facility]-will provide timely access to mental health services for children and adolescents (4-17) and adults presenting in a mental health crisis. The program is designed for those who need urgent Behavioral Health or Substance Use treatment and are not experiencing a medical crisis that would typically require an emergency room visit.   [address] [place], [place] [address] Phone: [phone] [email or web address]  [facility] will also offer the following outpatient services: (Monday through Friday 8am-5pm)   Partial Hospitalization Program (PHP)  Substance Abuse Intensive Outpatient Program (SA-IOP)  Group Therapy  Medication Management  Peer Living Room  We also provide (24/7):   Assessments: Our mental health clinician and providers will conduct a focused mental health evaluation, assessing for immediate safety concerns and further mental health needs.  Referral: Our team will provide resources and help connect to community based mental health treatment, when indicated, including psychotherapy, psychiatry, and other specialized behavioral health or substance use disorder services (for those not already in treatment).  Transitional Care: Our team providers in person bridging and/or telphonic follow-up during the patient's transition to outpatient services.    [facility] [place] [facility] [address] [place], [place] [address] PHONE: [phone] Hours: Monday-Friday 8:00AM-3:00PM  This agency offers the following services for free:   *Integrated Care -Case management -[facility] [address] [employer] -Medical clinic -Mental health nurse -Referrals  *Fundamental Services -Showers and hygiene supplies -Laundry -Phone access -Mailing addresses and mailboxes -Replacement IDs -Onsite barbershop -Storage lockers -[employer] winter warming center  *Engineer, drilling -Skilled trade  classes -Job skills classes -Resume and jobs application assistance -Interview training -GED classes -Environmental health practitioner -Warden/ranger   Homeless Shelter List:   Health and safety inspector [facility] [place]) [address] [place], [place] Phone: [phone]   [facility] has been providing emergency shelter to those in need of a permanent residence for over 35 years. The [facility] shelter plays an important role in our community.    There are many life events that can pull someone into a downward spiral towards poverty that is very difficult to get out of. Homelessness is a problem that can affect anyone of [place]. [facility] is a safe and comforting place to stay, especially if you have experienced the hardship of street life.    [facility] provides a single bed and bedding to 100 adult men and women. The shelter welcomes all who are in need of housing, no one in real need is turned away unless space is not available.    While staying at [facility], guests are offered more than just a bed for a night. Hot meals are provided and every guest has access to case management services. Case managers provide assistance with finding housing, employment, or other services that will help them gain stability. Continuous stay is based on availability, capacity, and progress towards goals.    To contact the front desk of [facility] please call   [phone] ext [phone] or ext. [phone].   [facility] [address], [place], [place] [address] Phone: [phone]   [facility] (Women only) [address], [place] [address] Phone: [phone]   [facility] [address], [place] [address] Phone: [phone]   [facility]: [phone]. [address] [place], [place] [address] Phone: [phone]   [facility] [address], [place], [place]  [address] Check in at 6:00PM for placement at a local shelter) Phone: [phone]   Substance abuse resources and Residential Options:  [facility] day residential substance abuse facility (not an option if you have active assault charges). [address], [place], [place] [address] Phone: [phone]: Ask for Drema Pry in admissions to complete intake if interested in pursuing this option.  [facility]: Can get intake scheduled; (not an option if you have active assault charges). [address] [place], [place] [phone]) Call Mon-Fri.  [facility]): (offers outpatient therapy and intensive outpatient substance abuse therapy).  [address] [place], [place] [address] Phone: [phone]  [facility]: Offers FREE recovery skills classes, support groups, 1:1 Peer Support, and Compeer Classes. [address] [place], [place] [address] Phone: [phone] (Call to complete intake).   [facility] [address] [place], [place] [address] Phone: [phone] ext [phone]  [facility] provides food, shelter and other programs and services to the homeless men of [place] through our mens program.  By offering safe shelter, three meals a day, clean clothing, Biblical counseling, financial planning, vocational training, GED/education and employment assistance, weve helped mend the shattered lives of many homeless men since opening in 1974.  We have approximately 267 beds available, with a max of 312 beds including mats for emergency situations and currently house an average of 270 men a night.  Prospective Client Check-In Information Photo ID Required (State/ Out of State/ [facility]) - if photo ID is not available, clients are required to have a printout of a police/sheriffs criminal history report. Help out with chores around the [facility]. No sex offender of any  type (pending, charged, registered and/or any other sex related offenses) will be permitted to check in. Must be willing to abide by all rules, regulations, and policies established by the [employer]. The following will be provided - shelter, food, clothing, and biblical counseling. If you or someone you know is in need of assistance at our mens shelter in [place], [place], please call [phone] ext. [phone].
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
## 2020-11-27 NOTE — ED Notes (Signed)
Belongings in locker

## 2020-11-27 NOTE — ED Notes (Signed)
Returned pt two belonging bag. She says she has all of her belongings.

## 2020-11-29 ENCOUNTER — Telehealth: Payer: Self-pay | Admitting: *Deleted

## 2020-11-29 NOTE — Telephone Encounter (Signed)
Transition Care Management Follow-up Telephone Call  Date of discharge and from where: 11/27/2020 - [facility]  How have you been since you were released from the hospital? "Doing better"  Any questions or concerns? No  Items Reviewed:  Did the pt receive and understand the discharge instructions provided? Yes   Medications obtained and verified? Yes   Other? No   Any new allergies since your discharge? No   Dietary orders reviewed? Yes  Do you have support at home? Yes   Home Care and Equipment/Supplies: Were home health services ordered? not applicable If so, what is the name of the agency? N/A  Has the agency set up a time to come to the patient's home? not applicable Were any new equipment or medical supplies ordered?  No What is the name of the medical supply agency? N/A Were you able to get the supplies/equipment? not applicable Do you have any questions related to the use of the equipment or supplies? No  Functional Questionnaire: (I = Independent and D = Dependent) ADLs: I  Bathing/Dressing- I  Meal Prep- I  Eating- I  Maintaining continence- I  Transferring/Ambulation- I  Managing Meds- I  Follow up appointments reviewed:   PCP Hospital f/u appt confirmed? No    Specialist Hospital f/u appt confirmed? No    Are transportation arrangements needed? No   If their condition worsens, is the pt aware to call PCP or go to the Emergency Dept.? Yes  Was the patient provided with contact information for the PCP's office or ED? Yes  Was to pt encouraged to call back with questions or concerns? Yes

## 2020-12-05 ENCOUNTER — Emergency Department (HOSPITAL_COMMUNITY)
Admission: EM | Admit: 2020-12-05 | Discharge: 2020-12-05 | Disposition: A | Discharge status: Home / Self Care | Payer: Medicaid Other | Attending: Emergency Medicine | Admitting: Emergency Medicine

## 2020-12-05 ENCOUNTER — Other Ambulatory Visit: Payer: Self-pay

## 2020-12-05 ENCOUNTER — Encounter (HOSPITAL_COMMUNITY): Payer: Self-pay | Admitting: Pharmacy Technician

## 2020-12-05 DIAGNOSIS — Z7984 Long term (current) use of oral hypoglycemic drugs: Secondary | ICD-10-CM | POA: Diagnosis not present

## 2020-12-05 DIAGNOSIS — L02411 Cutaneous abscess of right axilla: Secondary | ICD-10-CM | POA: Diagnosis not present

## 2020-12-05 DIAGNOSIS — L02419 Cutaneous abscess of limb, unspecified: Secondary | ICD-10-CM

## 2020-12-05 DIAGNOSIS — E119 Type 2 diabetes mellitus without complications: Secondary | ICD-10-CM | POA: Insufficient documentation

## 2020-12-05 DIAGNOSIS — Z23 Encounter for immunization: Secondary | ICD-10-CM | POA: Diagnosis not present

## 2020-12-05 MED ORDER — CEPHALEXIN 500 MG PO CAPS: 500.0000 mg | ORAL_CAPSULE | Freq: Two times a day (BID) | ORAL | 0 refills | Status: AC

## 2020-12-05 MED ORDER — NAPROXEN 500 MG PO TABS: 500.0000 mg | ORAL_TABLET | Freq: Two times a day (BID) | ORAL | 0 refills | Status: DC

## 2020-12-05 MED ORDER — TETANUS-DIPHTH-ACELL PERTUSSIS 5-2.5-18.5 LF-MCG/0.5 IM SUSY
0.5000 mL | PREFILLED_SYRINGE | Freq: Once | INTRAMUSCULAR | Status: AC
  Administered 2020-12-05: 0.5 mL via INTRAMUSCULAR
  Filled 2020-12-05: qty 0.5

## 2020-12-05 NOTE — Discharge Instructions (Signed)
Warm compress 4 times a day for the next 2 days.  You want this area to drain.  Take the antibiotics as prescribed.  Follow-up with primary care provider in 3 days for wound recheck

## 2020-12-05 NOTE — ED Notes (Signed)
Patient Alert and oriented to baseline. Stable and ambulatory to baseline. Patient verbalized understanding of the discharge instructions.  Patient belongings were taken by the patient.   

## 2020-12-05 NOTE — ED Provider Notes (Signed)
[facility] EMERGENCY DEPARTMENT Provider Note   CSN: [id number] Arrival date & time: 12/05/20  0801     History Abscess  Jennifer Crawford is a 45 y.o. female with history significant for epilepsy, pseudoseizures, anxiety, diabetes, cocaine use who presents for evaluation of right axillary abscess.  Noted yesterday.  Has some tenderness and surrounding redness.  No fever, chills, nausea, vomiting, chest pain, shortness of breath, no paresthesias, weakness, extremity edema.  Has not an abscess previously.  Has not taken anything for symptoms.  She rates her current pain a 8/10 when she palpates the area, a 4/10 when she is not touching it.  No pruritus.  Denies additional aggravating or alleviating factors  History obtained from patient and past medical records.  No interpreter used  HPI     Past Medical History:  Diagnosis Date  . Anxiety and depression   . Diabetes mellitus without complication ([place])   . Right flank pain 05/2019  . Right upper quadrant pain 8/22020  . Seizures ([place]) 2015  . Vitamin D deficiency 05/2019    Patient Active Problem List   Diagnosis Date Noted  . Cocaine abuse with cocaine-induced mood disorder ([place]) 11/27/2020  . Alcohol use disorder, moderate, dependence ([place]) 11/27/2020  . Hypoglycemia 06/27/2019  . Hematuria 06/27/2019  . Low back pain with sciatica 05/31/2019  . Left flank pain 05/31/2019  . Intractable headache 05/13/2019  . Right upper quadrant pain 05/13/2019  . Right flank pain 05/13/2019  . Diabetes mellitus type 2 in nonobese ([place]) 06/20/2015  . Elevated blood alcohol level 06/20/2015  . Anxiety and depression 06/20/2015  . Seizure disorder ([place]) 06/19/2015    Past Surgical History:  Procedure Laterality Date  . ABDOMINAL HYSTERECTOMY    . TUBAL LIGATION       OB History   No obstetric history on file.     Family History  Problem Relation Age of Onset  . Seizures Daughter   . Asthma Son   .  Hypertension Father   . Arrhythmia Father        Status post pacemaker insertion  . Hypertension Mother   . Diabetes Mother     Social History   Tobacco Use  . Smoking status: Never Smoker  . Smokeless tobacco: Never Used  Vaping Use  . Vaping Use: Never used  Substance Use Topics  . Alcohol use: Yes    Alcohol/week: 0.0 standard drinks    Comment: Occasional social  . Drug use: No    Home Medications Prior to Admission medications   Medication Sig Start Date End Date Taking? Authorizing Provider  cephALEXin (KEFLEX) 500 MG capsule Take 1 capsule (500 mg total) by mouth 2 (two) times daily for 7 days. 12/05/20 12/12/20 Yes Ruble Buttler A, PA-C  naproxen (NAPROSYN) 500 MG tablet Take 1 tablet (500 mg total) by mouth 2 (two) times daily. 12/05/20  Yes Melat Wrisley A, PA-C  albuterol (PROVENTIL) (2.5 MG/3ML) 0.083% nebulizer solution Take 3 mLs (2.5 mg total) by nebulization every 6 (six) hours as needed for wheezing or shortness of breath. 04/04/20   Truddie Hidden, MD  albuterol (VENTOLIN HFA) 108 (90 Base) MCG/ACT inhaler Inhale 2 puffs into the lungs every 6 (six) hours as needed for wheezing or shortness of breath. Patient not taking: Reported on 04/04/2020    [provider]  albuterol (VENTOLIN HFA) 108 (90 Base) MCG/ACT inhaler Inhale 2 puffs into the lungs every 4 (four) hours as needed for wheezing  or shortness of breath. 02/10/20   Margarita Mail, PA-C  blood glucose meter kit and supplies Dispense based on patient and insurance preference. Use up to four times daily as directed. (FOR ICD-10 E10.9, E11.9). 05/13/19   Azzie Glatter, FNP  Budesonide (PULMICORT FLEXHALER) 90 MCG/ACT inhaler Inhale 1 puff into the lungs 2 (two) times daily. Rinse mouth with water and spit after every use. 10/13/20   Augusto Gamble B, NP  ibuprofen (ADVIL) 800 MG tablet Take 1 tablet (800 mg total) by mouth 3 (three) times daily. 08/05/20   Sherwood Gambler, MD  levETIRAcetam (KEPPRA)  1000 MG tablet Take 1 tablet (1,000 mg total) by mouth 2 (two) times daily. 04/25/20   Larene Pickett, PA-C  METFORMIN HCL PO Take 1 tablet by mouth 2 (two) times daily with a meal.    [provider]    Allergies    Tramadol and Acetaminophen  Review of Systems   Review of Systems  Constitutional: Negative.   HENT: Negative.   Respiratory: Negative.   Cardiovascular: Negative.   Gastrointestinal: Negative.   Genitourinary: Negative.   Musculoskeletal: Negative.   Skin: Positive for wound.  Neurological: Negative.   All other systems reviewed and are negative.   Physical Exam Updated Vital Signs BP 120/77 (BP Location: Right Arm)   Pulse 66   Temp 99.1 F (37.3 C) (Oral)   Resp 16   SpO2 99%   Physical Exam Vitals and nursing note reviewed.  Constitutional:      General: She is not in acute distress.    Appearance: She is well-developed and well-nourished. She is not ill-appearing, toxic-appearing or diaphoretic.  HENT:     Head: Normocephalic and atraumatic.     Nose: Nose normal.     Mouth/Throat:     Mouth: Mucous membranes are moist.  Eyes:     Pupils: Pupils are equal, round, and reactive to light.  Cardiovascular:     Rate and Rhythm: Normal rate.     Pulses: Normal pulses and intact distal pulses.          Radial pulses are 2+ on the right side and 2+ on the left side.     Heart sounds: Normal heart sounds.  Pulmonary:     Effort: Pulmonary effort is normal. No respiratory distress.     Breath sounds: Normal breath sounds.  Abdominal:     General: Bowel sounds are normal. There is no distension.  Musculoskeletal:        General: Normal range of motion.     Cervical back: Normal range of motion.     Comments: No bony tenderness.  Moves all 4 extremities without difficulty.  No extremity edema  Skin:    General: Skin is warm and dry.     Capillary Refill: Capillary refill takes less than 2 seconds.     Comments: 2 cm x 1 cm right axillary  abscess.  Surrounding erythema.  8 mm area of fluctuance, minimal surrounding induration.  Neurological:     General: No focal deficit present.     Mental Status: She is alert and oriented to person, place, and time.  Psychiatric:        Mood and Affect: Mood and affect normal.     ED Results / Procedures / Treatments   Labs (all labs ordered are listed, but only abnormal results are displayed) Labs Reviewed - No data to display  EKG None  Radiology No results found.  Procedures .Marland KitchenIncision  and Drainage  Date/Time: 12/05/2020 8:44 AM Performed by: Nettie Elm, PA-C Authorized by: Nettie Elm, PA-C   Consent:    Consent obtained:  Verbal   Consent given by:  Patient   Risks discussed:  Bleeding, incomplete drainage, pain, damage to other organs and infection   Alternatives discussed:  No treatment, delayed treatment, alternative treatment, observation and referral Universal protocol:    Procedure explained and questions answered to patient or proxy's satisfaction: yes     Relevant documents present and verified: yes     Test results available : yes     Imaging studies available: yes     Required blood products, implants, devices, and special equipment available: yes     Site/side marked: yes     Immediately prior to procedure, a time out was called: yes     Patient identity confirmed:  Verbally with patient Location:    Type:  Abscess Pre-procedure details:    Skin preparation:  Betadine Anesthesia:    Anesthesia method:  Local infiltration   Local anesthetic:  Lidocaine 1% WITH epi Procedure type:    Complexity:  Complex Procedure details:    Incision types:  Single straight   Incision depth:  Subcutaneous   Scalpel blade:  11   Wound management:  Probed and deloculated, irrigated with saline and extensive cleaning   Drainage:  Purulent   Drainage amount:  Moderate   Wound treatment:  Wound left open   Packing materials:  None Post-procedure  details:    Procedure completion:  Procedure terminated at patient's request     Medications Ordered in ED Medications  Tdap (BOOSTRIX) injection 0.5 mL (has no administration in time range)    ED Course  I have reviewed the triage vital signs and the nursing notes.  Pertinent labs & imaging results that were available during my care of the patient were reviewed by me and considered in my medical decision making (see chart for details).  45 year old here with right axillary abscess which she noticed yesterday.  She is afebrile, nonseptic, not ill-appearing.  Does have history of substance abuse however denies any injection to this area.  Patient does have a 2 cm x 1 cm linear abscess to right axilla.  There is some surrounding mild cellulitis.  She has normal musculoskeletal exam.  She is neurovascularly intact.  Tetanus is 45 years old will update.  See procedure note.  I was able to have significant drainage however patient elected to have procedure stopped. Did not allow for packing.  Discussed warm compress, antibiotics, anti-inflammatories and follow-up with PCP in 2 days for wound recheck.  She is agreeable for this. No systemic symptoms.  Low suspicion for deep space infection.  The patient has been appropriately medically screened and/or stabilized in the ED. I have low suspicion for any other emergent medical condition which would require further screening, evaluation or treatment in the ED or require inpatient management.  Patient is hemodynamically stable and in no acute distress.  Patient able to ambulate in department prior to ED.  Evaluation does not show acute pathology that would require ongoing or additional emergent interventions while in the emergency department or further inpatient treatment.  I have discussed the diagnosis with the patient and answered all questions.  Pain is been managed while in the emergency department and patient has no further complaints prior to  discharge.  Patient is comfortable with plan discussed in room and is stable for discharge at this time.  I have discussed strict return precautions for returning to the emergency department.  Patient was encouraged to follow-up with PCP/specialist refer to at discharge.    MDM Rules/Calculators/A&P                          Final Clinical Impression(s) / ED Diagnoses Final diagnoses:  Axillary abscess    Rx / DC Orders ED Discharge Orders         Ordered    cephALEXin (KEFLEX) 500 MG capsule  2 times daily        12/05/20 0840    naproxen (NAPROSYN) 500 MG tablet  2 times daily        12/05/20 0840           Asberry Lascola A, PA-C 12/05/20 0845    Tegeler, Gwenyth Allegra, MD 12/05/20 [phone]

## 2020-12-05 NOTE — ED Triage Notes (Signed)
Pt here with reports of boil to her R axilla onset yesterday.

## 2020-12-06 ENCOUNTER — Telehealth: Payer: Self-pay | Admitting: *Deleted

## 2020-12-06 NOTE — Telephone Encounter (Signed)
Transition Care Management Follow-up Telephone Call  Date of discharge and from where: 12/05/2020 - [facility]  How have you been since you were released from the hospital? "Feeling better"  Any questions or concerns? No  Items Reviewed:  Did the pt receive and understand the discharge instructions provided? Yes   Medications obtained and verified? Yes   Other? No   Any new allergies since your discharge? No   Dietary orders reviewed? Yes  Do you have support at home? Yes   Home Care and Equipment/Supplies: Were home health services ordered? not applicable If so, what is the name of the agency? N/A  Has the agency set up a time to come to the patient's home? not applicable Were any new equipment or medical supplies ordered?  No What is the name of the medical supply agency? N/A Were you able to get the supplies/equipment? not applicable Do you have any questions related to the use of the equipment or supplies? No  Functional Questionnaire: (I = Independent and D = Dependent) ADLs: I  Bathing/Dressing- I  Meal Prep- I  Eating- I  Maintaining continence- I  Transferring/Ambulation- I  Managing Meds- I  Follow up appointments reviewed:   PCP Hospital f/u appt confirmed? No    Specialist Hospital f/u appt confirmed? No    Are transportation arrangements needed? No   If their condition worsens, is the pt aware to call PCP or go to the Emergency Dept.? Yes  Was the patient provided with contact information for the PCP's office or ED? Yes  Was to pt encouraged to call back with questions or concerns? Yes

## 2020-12-22 ENCOUNTER — Encounter (HOSPITAL_COMMUNITY): Payer: Self-pay

## 2020-12-22 ENCOUNTER — Other Ambulatory Visit: Payer: Self-pay

## 2020-12-22 ENCOUNTER — Ambulatory Visit (HOSPITAL_COMMUNITY)
Admission: EM | Admit: 2020-12-22 | Discharge: 2020-12-22 | Disposition: A | Discharge status: Home / Self Care | Payer: Medicaid Other | Attending: Emergency Medicine | Admitting: Emergency Medicine

## 2020-12-22 DIAGNOSIS — R1084 Generalized abdominal pain: Secondary | ICD-10-CM | POA: Insufficient documentation

## 2020-12-22 DIAGNOSIS — R319 Hematuria, unspecified: Secondary | ICD-10-CM | POA: Diagnosis not present

## 2020-12-22 DIAGNOSIS — R5383 Other fatigue: Secondary | ICD-10-CM | POA: Diagnosis not present

## 2020-12-22 DIAGNOSIS — R5381 Other malaise: Secondary | ICD-10-CM | POA: Diagnosis present

## 2020-12-22 DIAGNOSIS — R0789 Other chest pain: Secondary | ICD-10-CM | POA: Insufficient documentation

## 2020-12-22 LAB — BASIC METABOLIC PANEL
Anion gap: 7 (ref 5–15)
BUN: 14 mg/dL (ref 6–20)
CO2: 23 mmol/L (ref 22–32)
Calcium: 9.5 mg/dL (ref 8.9–10.3)
Chloride: 107 mmol/L (ref 98–111)
Creatinine, Ser: 0.87 mg/dL (ref 0.44–1.00)
GFR, Estimated: >60 mL/min (ref >60)
Glucose, Bld: 91 mg/dL (ref 70–99)
Potassium: 4 mmol/L (ref 3.5–5.1)
Sodium: 137 mmol/L (ref 135–145)

## 2020-12-22 LAB — POCT URINALYSIS DIPSTICK, ED / UC
Bilirubin Urine: NEGATIVE
Glucose, UA: NEGATIVE mg/dL
Ketones, ur: NEGATIVE mg/dL
Leukocytes,Ua: NEGATIVE
Nitrite: NEGATIVE
Protein, ur: NEGATIVE mg/dL
Specific Gravity, Urine: >=1.03 (ref 1.005–1.030)
Urobilinogen, UA: 0.2 mg/dL (ref 0.0–1.0)
pH: 5.5 (ref 5.0–8.0)

## 2020-12-22 LAB — CBC WITH DIFFERENTIAL/PLATELET
Abs Immature Granulocytes: 0.01 10*3/uL (ref 0.00–0.07)
Basophils Absolute: 0.1 10*3/uL (ref 0.0–0.1)
Basophils Relative: 1 %
Eosinophils Absolute: 0.2 10*3/uL (ref 0.0–0.5)
Eosinophils Relative: 4 %
HCT: 41.5 % (ref 36.0–46.0)
Hemoglobin: 13.6 g/dL (ref 12.0–15.0)
Immature Granulocytes: 0 %
Lymphocytes Relative: 51 %
Lymphs Abs: 2.4 10*3/uL (ref 0.7–4.0)
MCH: 31.1 pg (ref 26.0–34.0)
MCHC: 32.8 g/dL (ref 30.0–36.0)
MCV: 94.7 fL (ref 80.0–100.0)
Monocytes Absolute: 0.4 10*3/uL (ref 0.1–1.0)
Monocytes Relative: 8 %
Neutro Abs: 1.7 10*3/uL (ref 1.7–7.7)
Neutrophils Relative %: 36 %
Platelets: 193 10*3/uL (ref 150–400)
RBC: 4.38 MIL/uL (ref 3.87–5.11)
RDW: 13.4 % (ref 11.5–15.5)
WBC: 4.6 10*3/uL (ref 4.0–10.5)
nRBC: 0 % (ref 0.0–0.2)

## 2020-12-22 LAB — CBG MONITORING, ED: Glucose-Capillary: 80 mg/dL (ref 70–99)

## 2020-12-22 MED ORDER — ALBUTEROL SULFATE HFA 108 (90 BASE) MCG/ACT IN AERS: 2.0000 | INHALATION_SPRAY | Freq: Four times a day (QID) | RESPIRATORY_TRACT | 0 refills | Status: DC | PRN

## 2020-12-22 NOTE — Discharge Instructions (Signed)
Your urine is not consistent with a Uti but I have sent it to be cultured to confirm this.  Rest.  Plenty of water to maintain hydration as you do appear dehydrated.  I have refilled your albuterol.  I will call you if there are any other source of your symptoms or abnormalities found on your blood tests.  Please follow up with your primary care provider for recheck and management of your medications. Return or go to the ER for any worsening of symptoms.

## 2020-12-22 NOTE — ED Provider Notes (Signed)
[place]    CSN: [id number] Arrival date & time: 12/22/20  1308      History   Chief Complaint Chief Complaint  Patient presents with  . Generalized Body Aches  . Chills  . Headache    HPI Jennifer Crawford is a 45 y.o. female.   Jennifer Crawford presents with complaints of chills, fatigue, weakness, urinary frequency, abdominal pain, and right breast pain, which all started yesterday. No known ill contacts. No nausea vomiting or diarrhea. No shortness of breath . No cough or congestion. She states she has a history of bronchitis and has been given inhalers for this in the past which she is currently out of. Hasn't taken any medications for symptoms. Was treated for an axillary abscess 3/6 which she states has resolved. No longer on antibiotics. She had covid in january of 2022. History of dm, seizures, hematuria, right abdominal pain. States she takes metformin for diabetes and her blood sugars have been "low."      ROS per HPI, negative if not otherwise mentioned.      Past Medical History:  Diagnosis Date  . Anxiety and depression   . Diabetes mellitus without complication ([place])   . Right flank pain 05/2019  . Right upper quadrant pain 8/22020  . Seizures ([place]) 2015  . Vitamin D deficiency 05/2019    Patient Active Problem List   Diagnosis Date Noted  . Cocaine abuse with cocaine-induced mood disorder ([place]) 11/27/2020  . Alcohol use disorder, moderate, dependence ([place]) 11/27/2020  . Hypoglycemia 06/27/2019  . Hematuria 06/27/2019  . Low back pain with sciatica 05/31/2019  . Left flank pain 05/31/2019  . Intractable headache 05/13/2019  . Right upper quadrant pain 05/13/2019  . Right flank pain 05/13/2019  . Diabetes mellitus type 2 in nonobese ([place]) 06/20/2015  . Elevated blood alcohol level 06/20/2015  . Anxiety and depression 06/20/2015  . Seizure disorder ([place]) 06/19/2015    Past Surgical History:  Procedure Laterality Date   . ABDOMINAL HYSTERECTOMY    . TUBAL LIGATION      OB History   No obstetric history on file.      Home Medications    Prior to Admission medications   Medication Sig Start Date End Date Taking? Authorizing Provider  albuterol (PROVENTIL) (2.5 MG/3ML) 0.083% nebulizer solution Take 3 mLs (2.5 mg total) by nebulization every 6 (six) hours as needed for wheezing or shortness of breath. 04/04/20   Truddie Hidden, MD  albuterol (VENTOLIN HFA) 108 (90 Base) MCG/ACT inhaler Inhale 2 puffs into the lungs every 4 (four) hours as needed for wheezing or shortness of breath. 02/10/20   Margarita Mail, PA-C  albuterol (VENTOLIN HFA) 108 (90 Base) MCG/ACT inhaler Inhale 2 puffs into the lungs every 6 (six) hours as needed for wheezing or shortness of breath. 12/22/20   Zigmund Gottron, NP  blood glucose meter kit and supplies Dispense based on patient and insurance preference. Use up to four times daily as directed. (FOR ICD-10 E10.9, E11.9). 05/13/19   Azzie Glatter, FNP  Budesonide (PULMICORT FLEXHALER) 90 MCG/ACT inhaler Inhale 1 puff into the lungs 2 (two) times daily. Rinse mouth with water and spit after every use. 10/13/20   Augusto Gamble B, NP  ibuprofen (ADVIL) 800 MG tablet Take 1 tablet (800 mg total) by mouth 3 (three) times daily. 08/05/20   Sherwood Gambler, MD  levETIRAcetam (KEPPRA) 1000 MG tablet Take 1 tablet (1,000 mg total) by mouth 2 (two)  times daily. 04/25/20   Larene Pickett, PA-C  METFORMIN HCL PO Take 1 tablet by mouth 2 (two) times daily with a meal.    [provider]  naproxen (NAPROSYN) 500 MG tablet Take 1 tablet (500 mg total) by mouth 2 (two) times daily. 12/05/20   Henderly, Britni A, PA-C    Family History Family History  Problem Relation Age of Onset  . Seizures Daughter   . Asthma Son   . Hypertension Father   . Arrhythmia Father        Status post pacemaker insertion  . Hypertension Mother   . Diabetes Mother     Social History Social History    Tobacco Use  . Smoking status: Never Smoker  . Smokeless tobacco: Never Used  Vaping Use  . Vaping Use: Never used  Substance Use Topics  . Alcohol use: Yes    Alcohol/week: 0.0 standard drinks    Comment: Occasional social  . Drug use: No     Allergies   Tramadol and Acetaminophen   Review of Systems Review of Systems   Physical Exam Triage Vital Signs ED Triage Vitals  Enc Vitals Group     BP 12/22/20 1326 127/80     Pulse Rate 12/22/20 1326 68     Resp 12/22/20 1326 17     Temp 12/22/20 1326 98.4 F (36.9 C)     Temp Source 12/22/20 1326 Oral     SpO2 12/22/20 1326 98 %     Weight --      Height --      Head Circumference --      Peak Flow --      Pain Score 12/22/20 1325 8     Pain Loc --      Pain Edu? --      Excl. in [place]? --    No data found.  Updated Vital Signs BP 127/80 (BP Location: Left Arm)   Pulse 68   Temp 98.4 F (36.9 C) (Oral)   Resp 17   SpO2 98%   Visual Acuity Right Eye Distance:   Left Eye Distance:   Bilateral Distance:    Right Eye Near:   Left Eye Near:    Bilateral Near:     Physical Exam Constitutional:      General: She is not in acute distress.    Appearance: She is well-developed.  HENT:     Head: Normocephalic and atraumatic.  Cardiovascular:     Rate and Rhythm: Normal rate.  Pulmonary:     Effort: Pulmonary effort is normal.  Chest:     Chest wall: No mass or tenderness.     Comments: Indicates she feels pain from right lateral breast up to right shoulder, without reproducible pain on palpation, visible/ palpable mass or abnormality  Abdominal:     Palpations: Abdomen is soft.     Tenderness: There is abdominal tenderness. There is no right CVA tenderness or left CVA tenderness.     Comments: Generalized abdominal tenderness on palpation  Skin:    General: Skin is warm and dry.  Neurological:     Mental Status: She is alert and oriented to person, place, and time.      UC Treatments / Results   Labs (all labs ordered are listed, but only abnormal results are displayed) Labs Reviewed  POCT URINALYSIS DIPSTICK, ED / UC - Abnormal; Notable for the following components:      Result Value   Hgb  urine dipstick MODERATE (*)    All other components within normal limits  URINE CULTURE  CBC WITH DIFFERENTIAL/PLATELET  BASIC METABOLIC PANEL  CBG MONITORING, ED    EKG   Radiology No results found.  Procedures Procedures (including critical care time)  Medications Ordered in UC Medications - No data to display  Initial Impression / Assessment and Plan / UC Course  I have reviewed the triage vital signs and the nursing notes.  Pertinent labs & imaging results that were available during my care of the patient were reviewed by me and considered in my medical decision making (see chart for details).     Non toxic. Benign physical exam.  No work of breathing. No findings consistent with acute abdomen. No chest wall findings. Blood sugar is 80 here in clinic. Vitals are stable. Urine with hematuria which appears consistent with previous samples as well, culture pending. Basic labs also collected to evaluate her electrolytes as well as cbc to ensure there is no other source of malaise. Return precautions provided. Encouraged follow up with PCP as needed. Patient verbalized understanding and agreeable to plan.   Final Clinical Impressions(s) / UC Diagnoses   Final diagnoses:  Malaise and fatigue  Generalized abdominal pain  Hematuria, unspecified type  Right-sided chest wall pain   Discharge Instructions   None    ED Prescriptions    Medication Sig Dispense Auth. Provider   albuterol (VENTOLIN HFA) 108 (90 Base) MCG/ACT inhaler Inhale 2 puffs into the lungs every 6 (six) hours as needed for wheezing or shortness of breath. 8 g Zigmund Gottron, NP     PDMP not reviewed this encounter.   Zigmund Gottron, NP 12/22/20 [phone]

## 2020-12-22 NOTE — ED Notes (Signed)
A covid swab was obtained by initial clinical person.  Provider does not want this sample to be run, dispose of sample.

## 2020-12-22 NOTE — ED Triage Notes (Signed)
Pt c/o generalized body aches and a headache. Pt states she had Bronchitis and has been having trouble breathing. She states she has been feeling weak.

## 2020-12-23 LAB — URINE CULTURE: Culture: NO GROWTH

## 2021-01-09 ENCOUNTER — Encounter (HOSPITAL_COMMUNITY): Payer: Self-pay

## 2021-01-09 ENCOUNTER — Emergency Department (HOSPITAL_COMMUNITY)
Admission: EM | Admit: 2021-01-09 | Discharge: 2021-01-09 | Disposition: A | Discharge status: Home / Self Care | Payer: Medicaid Other | Attending: Emergency Medicine | Admitting: Emergency Medicine

## 2021-01-09 DIAGNOSIS — F10129 Alcohol abuse with intoxication, unspecified: Secondary | ICD-10-CM | POA: Diagnosis not present

## 2021-01-09 DIAGNOSIS — R569 Unspecified convulsions: Secondary | ICD-10-CM

## 2021-01-09 DIAGNOSIS — E119 Type 2 diabetes mellitus without complications: Secondary | ICD-10-CM | POA: Diagnosis not present

## 2021-01-09 DIAGNOSIS — G40909 Epilepsy, unspecified, not intractable, without status epilepticus: Secondary | ICD-10-CM | POA: Diagnosis not present

## 2021-01-09 DIAGNOSIS — F10920 Alcohol use, unspecified with intoxication, uncomplicated: Secondary | ICD-10-CM

## 2021-01-09 DIAGNOSIS — Z7984 Long term (current) use of oral hypoglycemic drugs: Secondary | ICD-10-CM | POA: Insufficient documentation

## 2021-01-09 LAB — CBC WITH DIFFERENTIAL/PLATELET
Abs Immature Granulocytes: 0.02 10*3/uL (ref 0.00–0.07)
Basophils Absolute: 0 10*3/uL (ref 0.0–0.1)
Basophils Relative: 1 %
Eosinophils Absolute: 0 10*3/uL (ref 0.0–0.5)
Eosinophils Relative: 0 %
HCT: 36.8 % (ref 36.0–46.0)
Hemoglobin: 12.1 g/dL (ref 12.0–15.0)
Immature Granulocytes: 0 %
Lymphocytes Relative: 36 %
Lymphs Abs: 2.5 10*3/uL (ref 0.7–4.0)
MCH: 31.7 pg (ref 26.0–34.0)
MCHC: 32.9 g/dL (ref 30.0–36.0)
MCV: 96.3 fL (ref 80.0–100.0)
Monocytes Absolute: 0.4 10*3/uL (ref 0.1–1.0)
Monocytes Relative: 6 %
Neutro Abs: 4.1 10*3/uL (ref 1.7–7.7)
Neutrophils Relative %: 57 %
Platelets: 189 10*3/uL (ref 150–400)
RBC: 3.82 MIL/uL — ABNORMAL LOW (ref 3.87–5.11)
RDW: 13.2 % (ref 11.5–15.5)
WBC: 7.1 10*3/uL (ref 4.0–10.5)
nRBC: 0 % (ref 0.0–0.2)

## 2021-01-09 LAB — COMPREHENSIVE METABOLIC PANEL
ALT: 16 U/L (ref 0–44)
AST: 19 U/L (ref 15–41)
Albumin: 4.1 g/dL (ref 3.5–5.0)
Alkaline Phosphatase: 48 U/L (ref 38–126)
Anion gap: 9 (ref 5–15)
BUN: 10 mg/dL (ref 6–20)
CO2: 19 mmol/L — ABNORMAL LOW (ref 22–32)
Calcium: 8.9 mg/dL (ref 8.9–10.3)
Chloride: 110 mmol/L (ref 98–111)
Creatinine, Ser: 0.76 mg/dL (ref 0.44–1.00)
GFR, Estimated: >60 mL/min (ref >60)
Glucose, Bld: 88 mg/dL (ref 70–99)
Potassium: 3.9 mmol/L (ref 3.5–5.1)
Sodium: 138 mmol/L (ref 135–145)
Total Bilirubin: 0.6 mg/dL (ref 0.3–1.2)
Total Protein: 7.3 g/dL (ref 6.5–8.1)

## 2021-01-09 LAB — ETHANOL: Alcohol, Ethyl (B): 101 mg/dL — ABNORMAL HIGH (ref <10)

## 2021-01-09 MED ORDER — SODIUM CHLORIDE 0.9 % IV BOLUS
1000.0000 mL | Freq: Once | INTRAVENOUS | Status: AC
  Administered 2021-01-09: 1000 mL via INTRAVENOUS

## 2021-01-09 NOTE — ED Provider Notes (Signed)
[facility] EMERGENCY DEPARTMENT Provider Note  CSN: [id number] Arrival date & time: 01/09/21 0231  Chief Complaint(s) Seizures  HPI Jennifer Crawford is a 44 y.o. female with a past medical history listed below very nonepileptic seizures versus seizures on Keppra, alcohol use disorder and cocaine use disorder here for seizure-like activity.  Per EMS patient was at work and had a seizure-like activity at work.  EMS reported that patient had been drinking prior to work.  They also reported that the patient had taken her Keppra late.  She was given 1/2 mg of Versed in route for seizure-like activity.  Remainder of history, ROS, and physical exam limited due to patient's condition (sedated). Additional information was obtained from EMS.   Level V Caveat.    HPI  Past Medical History Past Medical History:  Diagnosis Date  . Anxiety and depression   . Diabetes mellitus without complication (HCC)   . Right flank pain 05/2019  . Right upper quadrant pain 8/22020  . Seizures (HCC) 2015  . Vitamin D deficiency 05/2019   Patient Active Problem List   Diagnosis Date Noted  . Cocaine abuse with cocaine-induced mood disorder (HCC) 11/27/2020  . Alcohol use disorder, moderate, dependence (HCC) 11/27/2020  . Hypoglycemia 06/27/2019  . Hematuria 06/27/2019  . Low back pain with sciatica 05/31/2019  . Left flank pain 05/31/2019  . Intractable headache 05/13/2019  . Right upper quadrant pain 05/13/2019  . Right flank pain 05/13/2019  . Diabetes mellitus type 2 in nonobese (HCC) 06/20/2015  . Elevated blood alcohol level 06/20/2015  . Anxiety and depression 06/20/2015  . Seizure disorder (HCC) 06/19/2015   Home Medication(s) Prior to Admission medications   Medication Sig Start Date End Date Taking? Authorizing Provider  albuterol (PROVENTIL) (2.5 MG/3ML) 0.083% nebulizer solution Take 3 mLs (2.5 mg total) by nebulization every 6 (six) hours as needed for wheezing  or shortness of breath. 04/04/20   Sheldon, Charles B, MD  albuterol (VENTOLIN HFA) 108 (90 Base) MCG/ACT inhaler Inhale 2 puffs into the lungs every 4 (four) hours as needed for wheezing or shortness of breath. 02/10/20   Harris, Abigail, PA-C  albuterol (VENTOLIN HFA) 108 (90 Base) MCG/ACT inhaler Inhale 2 puffs into the lungs every 6 (six) hours as needed for wheezing or shortness of breath. 12/22/20   Burky, Natalie B, NP  blood glucose meter kit and supplies Dispense based on patient and insurance preference. Use up to four times daily as directed. (FOR ICD-10 E10.9, E11.9). 05/13/19   Stroud, Natalie M, FNP  Budesonide (PULMICORT FLEXHALER) 90 MCG/ACT inhaler Inhale 1 puff into the lungs 2 (two) times daily. Rinse mouth with water and spit after every use. 10/13/20   Burky, Natalie B, NP  ibuprofen (ADVIL) 800 MG tablet Take 1 tablet (800 mg total) by mouth 3 (three) times daily. 08/05/20   Goldston, Scott, MD  levETIRAcetam (KEPPRA) 1000 MG tablet Take 1 tablet (1,000 mg total) by mouth 2 (two) times daily. 04/25/20   Sanders, Lisa M, PA-C  METFORMIN HCL PO Take 1 tablet by mouth 2 (two) times daily with a meal.    [provider]  naproxen (NAPROSYN) 500 MG tablet Take 1 tablet (500 mg total) by mouth 2 (two) times daily. 12/05/20   Henderly, Britni A, PA-C                                                                                                                                      Past Surgical History Past Surgical History:  Procedure Laterality Date  . ABDOMINAL HYSTERECTOMY    . TUBAL LIGATION     Family History Family History  Problem Relation Age of Onset  . Seizures Daughter   . Asthma Son   . Hypertension Father   . Arrhythmia Father        Status post pacemaker insertion  . Hypertension Mother   . Diabetes Mother     Social History Social History   Tobacco Use  . Smoking status: Never Smoker  . Smokeless tobacco: Never Used  Vaping Use  . Vaping Use: Never used   Substance Use Topics  . Alcohol use: Yes    Alcohol/week: 0.0 standard drinks    Comment: Occasional social  . Drug use: No   Allergies Tramadol and Acetaminophen  Review of Systems Review of Systems  Unable to perform ROS: Mental status change    Physical Exam Vital Signs  I have reviewed the triage vital signs BP 122/82   Pulse 85   Temp (!) 97.2 F (36.2 C) (Temporal)   Resp 15   Ht 6' 1" (1.854 m)   Wt 80 kg   SpO2 100%   BMI 23.27 kg/m   Physical Exam Vitals reviewed.  Constitutional:      General: She is not in acute distress.    Appearance: She is well-developed. She is not diaphoretic.  HENT:     Head: Normocephalic and atraumatic.     Nose: Nose normal.  Eyes:     General: No scleral icterus.       Right eye: No discharge.        Left eye: No discharge.     Conjunctiva/sclera: Conjunctivae normal.     Pupils: Pupils are equal, round, and reactive to light.  Cardiovascular:     Rate and Rhythm: Normal rate and regular rhythm.     Heart sounds: No murmur heard. No friction rub. No gallop.   Pulmonary:     Effort: Pulmonary effort is normal. No respiratory distress.     Breath sounds: Normal breath sounds. No stridor. No rales.  Abdominal:     General: There is no distension.     Palpations: Abdomen is soft.     Tenderness: There is no abdominal tenderness.  Musculoskeletal:        General: No tenderness.     Cervical back: Normal range of motion and neck supple.  Skin:    General: Skin is warm and dry.     Findings: No erythema or rash.  Neurological:     Mental Status: She is unresponsive.     Comments: Moves all extremities. Intermittently shakes and has seizure-like activity that breaks with confrontation with saline flush and eyelash reflex.     ED Results and Treatments Labs (all labs ordered are listed, but only abnormal results are displayed) Labs Reviewed  CBC WITH DIFFERENTIAL/PLATELET - Abnormal; Notable for the following  components:      Result Value   RBC 3.82 (*)    All other components within normal limits  COMPREHENSIVE METABOLIC PANEL - Abnormal; Notable for the following components:   CO2 19 (*)    All other components within normal limits  ETHANOL - Abnormal; Notable for the following components:   Alcohol, Ethyl (B) 101 (*)    All other components within normal limits  EKG  EKG Interpretation  Date/Time:    Ventricular Rate:    PR Interval:    QRS Duration:   QT Interval:    QTC Calculation:   R Axis:     Text Interpretation:        Radiology No results found.  Pertinent labs & imaging results that were available during my care of the patient were reviewed by me and considered in my medical decision making (see chart for details).  Medications Ordered in ED Medications  sodium chloride 0.9 % bolus 1,000 mL (0 mLs Intravenous Stopped 01/09/21 0524)  sodium chloride 0.9 % bolus 1,000 mL (0 mLs Intravenous Stopped 01/09/21 0524)                                                                                                                                    Procedures Procedures  (including critical care time)  Medical Decision Making / ED Course I have reviewed the nursing notes for this encounter and the patient's prior records (if available in EHR or on provided paperwork).   Jennifer Crawford was evaluated in Emergency Department on 01/09/2021 for the symptoms described in the history of present illness. She was evaluated in the context of the global COVID-19 pandemic, which necessitated consideration that the patient might be at risk for infection with the SARS-CoV-2 virus that causes COVID-19. Institutional protocols and algorithms that pertain to the evaluation of patients at risk for COVID-19 are in a state of rapid change based on information released by  regulatory bodies including the CDC and federal and state organizations. These policies and algorithms were followed during the patient's care in the ED.  Patient presents for seizure-like activity. Patient has presented to the emergency department before for similar episodes of nonepileptic seizures in the setting of alcohol use. Confirmed alcohol intoxication labs. Patient had several nonepileptic seizures which broke with confrontation.  Provided with 2 L of IV fluids and monitored for several hours.  No epileptic seizures noted. Patient metabolized and was able to ambulate without complication. Returned to baseline.      Final Clinical Impression(s) / ED Diagnoses Final diagnoses:  Seizure-like activity ([place])  Alcoholic intoxication without complication ([place])    The patient appears reasonably screened and/or stabilized for discharge and I doubt any other medical condition or other [facility] requiring further screening, evaluation, or treatment in the ED at this time prior to discharge. Safe for discharge with strict return precautions.  Disposition: Discharge  Condition: Good  I have discussed the results, Dx and Tx plan with the patient/family who expressed understanding and agree(s) with the plan. Discharge instructions discussed at length. The patient/family was given strict return precautions who verbalized understanding of the instructions. No further questions at time of discharge.    ED Discharge Orders    None       Follow Up: Primary care provider  Call  as needed  This chart was dictated using voice recognition software.  Despite best efforts to proofread,  errors can occur which can change the documentation meaning.   Fatima Blank, MD 01/09/21 [phone]

## 2021-01-09 NOTE — ED Triage Notes (Signed)
Coming from work with EMS after witnessed seizure that lasted about 1 minute. Pt took her medications later than usual yesterday and also drank alocohol ( which triggers seizures). Pt received 7.5 total versed. Had about 4 seizures total with longest one lasting for 2 minutes. Pt did not hit head; post-ictal upon arrival to ED.

## 2021-02-02 ENCOUNTER — Other Ambulatory Visit: Payer: Self-pay

## 2021-02-02 ENCOUNTER — Encounter (HOSPITAL_COMMUNITY): Payer: Self-pay

## 2021-02-02 ENCOUNTER — Emergency Department (HOSPITAL_COMMUNITY)
Admission: EM | Admit: 2021-02-02 | Discharge: 2021-02-02 | Disposition: A | Discharge status: Home / Self Care | Payer: Medicaid Other | Attending: Emergency Medicine | Admitting: Emergency Medicine

## 2021-02-02 DIAGNOSIS — E119 Type 2 diabetes mellitus without complications: Secondary | ICD-10-CM | POA: Insufficient documentation

## 2021-02-02 DIAGNOSIS — R569 Unspecified convulsions: Secondary | ICD-10-CM

## 2021-02-02 DIAGNOSIS — Z7984 Long term (current) use of oral hypoglycemic drugs: Secondary | ICD-10-CM | POA: Insufficient documentation

## 2021-02-02 DIAGNOSIS — Y905 Blood alcohol level of 100-119 mg/100 ml: Secondary | ICD-10-CM | POA: Insufficient documentation

## 2021-02-02 DIAGNOSIS — G40909 Epilepsy, unspecified, not intractable, without status epilepticus: Secondary | ICD-10-CM | POA: Diagnosis not present

## 2021-02-02 LAB — CBC WITH DIFFERENTIAL/PLATELET
Abs Immature Granulocytes: 0 10*3/uL (ref 0.00–0.07)
Basophils Absolute: 0 10*3/uL (ref 0.0–0.1)
Basophils Relative: 1 %
Eosinophils Absolute: 0.1 10*3/uL (ref 0.0–0.5)
Eosinophils Relative: 1 %
HCT: 38.8 % (ref 36.0–46.0)
Hemoglobin: 12.6 g/dL (ref 12.0–15.0)
Immature Granulocytes: 0 %
Lymphocytes Relative: 38 %
Lymphs Abs: 2.3 10*3/uL (ref 0.7–4.0)
MCH: 31 pg (ref 26.0–34.0)
MCHC: 32.5 g/dL (ref 30.0–36.0)
MCV: 95.3 fL (ref 80.0–100.0)
Monocytes Absolute: 0.3 10*3/uL (ref 0.1–1.0)
Monocytes Relative: 6 %
Neutro Abs: 3.4 10*3/uL (ref 1.7–7.7)
Neutrophils Relative %: 54 %
Platelets: 197 10*3/uL (ref 150–400)
RBC: 4.07 MIL/uL (ref 3.87–5.11)
RDW: 13.2 % (ref 11.5–15.5)
WBC: 6.2 10*3/uL (ref 4.0–10.5)
nRBC: 0 % (ref 0.0–0.2)

## 2021-02-02 LAB — URINALYSIS, ROUTINE W REFLEX MICROSCOPIC
Bacteria, UA: NONE SEEN
Bilirubin Urine: NEGATIVE
Glucose, UA: NEGATIVE mg/dL
Ketones, ur: NEGATIVE mg/dL
Leukocytes,Ua: NEGATIVE
Nitrite: NEGATIVE
Protein, ur: NEGATIVE mg/dL
Specific Gravity, Urine: 1.005 (ref 1.005–1.030)
pH: 6 (ref 5.0–8.0)

## 2021-02-02 LAB — COMPREHENSIVE METABOLIC PANEL
ALT: 18 U/L (ref 0–44)
AST: 23 U/L (ref 15–41)
Albumin: 4.6 g/dL (ref 3.5–5.0)
Alkaline Phosphatase: 52 U/L (ref 38–126)
Anion gap: 8 (ref 5–15)
BUN: 15 mg/dL (ref 6–20)
CO2: 22 mmol/L (ref 22–32)
Calcium: 9.7 mg/dL (ref 8.9–10.3)
Chloride: 111 mmol/L (ref 98–111)
Creatinine, Ser: 0.9 mg/dL (ref 0.44–1.00)
GFR, Estimated: >60 mL/min (ref >60)
Glucose, Bld: 84 mg/dL (ref 70–99)
Potassium: 3.9 mmol/L (ref 3.5–5.1)
Sodium: 141 mmol/L (ref 135–145)
Total Bilirubin: 0.3 mg/dL (ref 0.3–1.2)
Total Protein: 8.2 g/dL — ABNORMAL HIGH (ref 6.5–8.1)

## 2021-02-02 LAB — RAPID URINE DRUG SCREEN, HOSP PERFORMED
Amphetamines: NOT DETECTED
Barbiturates: NOT DETECTED
Benzodiazepines: POSITIVE — AB
Cocaine: POSITIVE — AB
Opiates: NOT DETECTED
Tetrahydrocannabinol: NOT DETECTED

## 2021-02-02 LAB — I-STAT BETA HCG BLOOD, ED (MC, WL, AP ONLY): I-stat hCG, quantitative: <5 m[IU]/mL (ref <5)

## 2021-02-02 LAB — ETHANOL: Alcohol, Ethyl (B): 110 mg/dL — ABNORMAL HIGH (ref <10)

## 2021-02-02 MED ORDER — SODIUM CHLORIDE 0.9 % IV BOLUS
1000.0000 mL | Freq: Once | INTRAVENOUS | Status: AC
  Administered 2021-02-02: 1000 mL via INTRAVENOUS

## 2021-02-02 MED ORDER — LEVETIRACETAM 1000 MG PO TABS: 1000.0000 mg | ORAL_TABLET | Freq: Two times a day (BID) | ORAL | 0 refills | Status: DC

## 2021-02-02 MED ORDER — LEVETIRACETAM IN NACL 1000 MG/100ML IV SOLN
1000.0000 mg | Freq: Once | INTRAVENOUS | Status: AC
  Administered 2021-02-02: 1000 mg via INTRAVENOUS
  Filled 2021-02-02: qty 100

## 2021-02-02 NOTE — ED Notes (Signed)
Patient ambulated  to restroom without assistance.

## 2021-02-02 NOTE — ED Triage Notes (Signed)
Patient arrives via EMS from home after patient called a friend and stated that she had not taken her evening dose of keppra and had not been drinking. EMS reports seizure like activity, but did not witness an actual seizure. 5 of Versed given IM en route.   EMS vitals: BP 118/60  CBG 104 SPO2 100% RA

## 2021-02-02 NOTE — Discharge Instructions (Addendum)
Take your seizure medications as prescribed and follow-up with your neurologist.  Return to the ED with new or worsening symptoms.  Do not drive or operate heavy machinery until you are seizure-free for 6 months.

## 2021-02-02 NOTE — ED Notes (Signed)
Patient was given a cup of water but does not want to drink any of it at the moment.

## 2021-02-02 NOTE — ED Provider Notes (Signed)
[facility] DEPT Provider Note   CSN: [id number] Arrival date & time: 02/02/21  0248     History No chief complaint on file.   Jennifer Crawford is a 44 y.o. female.  Level 5 caveat for altered mental status.  Patient brought in by EMS after reported seizure activity.  Family apparently reported seizure activity at home but none was witnessed by EMS.  She reportedly missed her evening dose of Keppra. she was combative for EMS and was given 5 mg of Versed.  She is now somnolent.  Patient does have a history of seizure disorder and takes Keppra.  Also has history of nonepileptic seizures and alcohol and cocaine abuse. No reported head trauma.  No tongue biting or incontinence.  Patient unable to give any history  The history is provided by the EMS personnel. The history is limited by the condition of the patient.       Past Medical History:  Diagnosis Date  . Anxiety and depression   . Diabetes mellitus without complication ([place])   . Right flank pain 05/2019  . Right upper quadrant pain 8/22020  . Seizures ([place]) 2015  . Vitamin D deficiency 05/2019    Patient Active Problem List   Diagnosis Date Noted  . Cocaine abuse with cocaine-induced mood disorder ([place]) 11/27/2020  . Alcohol use disorder, moderate, dependence ([place]) 11/27/2020  . Hypoglycemia 06/27/2019  . Hematuria 06/27/2019  . Low back pain with sciatica 05/31/2019  . Left flank pain 05/31/2019  . Intractable headache 05/13/2019  . Right upper quadrant pain 05/13/2019  . Right flank pain 05/13/2019  . Diabetes mellitus type 2 in nonobese ([place]) 06/20/2015  . Elevated blood alcohol level 06/20/2015  . Anxiety and depression 06/20/2015  . Seizure disorder ([place]) 06/19/2015    Past Surgical History:  Procedure Laterality Date  . ABDOMINAL HYSTERECTOMY    . TUBAL LIGATION       OB History   No obstetric history on file.     Family History  Problem Relation Age of Onset  .  Seizures Daughter   . Asthma Son   . Hypertension Father   . Arrhythmia Father        Status post pacemaker insertion  . Hypertension Mother   . Diabetes Mother     Social History   Tobacco Use  . Smoking status: Never Smoker  . Smokeless tobacco: Never Used  Vaping Use  . Vaping Use: Never used  Substance Use Topics  . Alcohol use: Yes    Alcohol/week: 0.0 standard drinks    Comment: Occasional social  . Drug use: No    Home Medications Prior to Admission medications   Medication Sig Start Date End Date Taking? Authorizing Provider  albuterol (PROVENTIL) (2.5 MG/3ML) 0.083% nebulizer solution Take 3 mLs (2.5 mg total) by nebulization every 6 (six) hours as needed for wheezing or shortness of breath. 04/04/20   Truddie Hidden, MD  albuterol (VENTOLIN HFA) 108 (90 Base) MCG/ACT inhaler Inhale 2 puffs into the lungs every 4 (four) hours as needed for wheezing or shortness of breath. 02/10/20   Margarita Mail, PA-C  albuterol (VENTOLIN HFA) 108 (90 Base) MCG/ACT inhaler Inhale 2 puffs into the lungs every 6 (six) hours as needed for wheezing or shortness of breath. 12/22/20   Zigmund Gottron, NP  blood glucose meter kit and supplies Dispense based on patient and insurance preference. Use up to four times daily as directed. (FOR ICD-10 E10.9, E11.9). 05/13/19  Azzie Glatter, FNP  Budesonide (PULMICORT FLEXHALER) 90 MCG/ACT inhaler Inhale 1 puff into the lungs 2 (two) times daily. Rinse mouth with water and spit after every use. 10/13/20   Augusto Gamble B, NP  ibuprofen (ADVIL) 800 MG tablet Take 1 tablet (800 mg total) by mouth 3 (three) times daily. 08/05/20   Sherwood Gambler, MD  levETIRAcetam (KEPPRA) 1000 MG tablet Take 1 tablet (1,000 mg total) by mouth 2 (two) times daily. 04/25/20   Larene Pickett, PA-C  METFORMIN HCL PO Take 1 tablet by mouth 2 (two) times daily with a meal.    [provider]  naproxen (NAPROSYN) 500 MG tablet Take 1 tablet (500 mg total) by mouth  2 (two) times daily. 12/05/20   Henderly, Britni A, PA-C    Allergies    Tramadol and Acetaminophen  Review of Systems   Review of Systems  Unable to perform ROS: Mental status change    Physical Exam Updated Vital Signs BP 112/71 (BP Location: Right Arm)   Pulse 86   Temp 97.9 F (36.6 C) (Oral)   Resp (!) 21   SpO2 96%   Physical Exam Vitals and nursing note reviewed.  Constitutional:      General: She is not in acute distress.    Appearance: She is well-developed.     Comments: Obtunded, responds to pain  HENT:     Head: Normocephalic and atraumatic.     Mouth/Throat:     Pharynx: No oropharyngeal exudate.  Eyes:     Conjunctiva/sclera: Conjunctivae normal.     Pupils: Pupils are equal, round, and reactive to light.  Neck:     Comments: No meningismus. Cardiovascular:     Rate and Rhythm: Normal rate and regular rhythm.     Heart sounds: Normal heart sounds. No murmur heard.   Pulmonary:     Effort: Pulmonary effort is normal. No respiratory distress.     Breath sounds: Normal breath sounds.  Abdominal:     Palpations: Abdomen is soft.     Tenderness: There is no abdominal tenderness. There is no guarding or rebound.  Musculoskeletal:        General: No tenderness. Normal range of motion.     Cervical back: Normal range of motion and neck supple.  Skin:    General: Skin is warm.  Neurological:     Mental Status: She is alert.     Cranial Nerves: No cranial nerve deficit.     Motor: No abnormal muscle tone.     Coordination: Coordination normal.     Comments: Withdraws to pain all extremities.  Does not follow commands  Psychiatric:        Behavior: Behavior normal.     ED Results / Procedures / Treatments   Labs (all labs ordered are listed, but only abnormal results are displayed) Labs Reviewed  COMPREHENSIVE METABOLIC PANEL - Abnormal; Notable for the following components:      Result Value   Total Protein 8.2 (*)    All other components within  normal limits  ETHANOL - Abnormal; Notable for the following components:   Alcohol, Ethyl (B) 110 (*)    All other components within normal limits  RAPID URINE DRUG SCREEN, HOSP PERFORMED - Abnormal; Notable for the following components:   Cocaine POSITIVE (*)    Benzodiazepines POSITIVE (*)    All other components within normal limits  URINALYSIS, ROUTINE W REFLEX MICROSCOPIC - Abnormal; Notable for the following components:  Color, Urine STRAW (*)    Hgb urine dipstick SMALL (*)    All other components within normal limits  CBC WITH DIFFERENTIAL/PLATELET  I-STAT BETA HCG BLOOD, ED ([facility], [facility], [facility] ONLY)    EKG EKG Interpretation  Date/Time:  Wednesday Feb 02 2021 03:17:07 EDT Ventricular Rate:  82 PR Interval:  187 QRS Duration: 78 QT Interval:  406 QTC Calculation: 475 R Axis:   64 Text Interpretation: Sinus rhythm Consider left ventricular hypertrophy Borderline T abnormalities, diffuse leads No significant change was found Confirmed by Ezequiel Essex [id number]) on 02/02/2021 3:19:37 AM   Radiology No results found.  Procedures Procedures   Medications Ordered in ED Medications  levETIRAcetam (KEPPRA) IVPB 1000 mg/100 mL premix (has no administration in time range)    ED Course  I have reviewed the triage vital signs and the nursing notes.  Pertinent labs & imaging results that were available during my care of the patient were reviewed by me and considered in my medical decision making (see chart for details).    MDM Rules/Calculators/A&P                         Seizure-like activity status post Versed by EMS, now obtunded.  Unable to give any history.  Stable vitals.  Patient given IV fluids and IV Keppra  Labs show alcohol intoxication of 110.  Otherwise normal.  At 5 AM, patient becoming more awake and answering questions.  She is alert and oriented to person and place.  States compliance with her Keppra. Denies any headache.  Complains of back pain which is  chronic.  Denies any chest pain or shortness of breath  UDS positive for cocaine and benzodiazeipine.   Patient is awake and alert and able to ambulate.  States she needs a refill of Keppra as she took her last dose yesterday.  Does not have a neurologist. No evidence of life-threatening alcohol withdrawal.  Patient will be able to be discharged when she has a sober ride She understands [place] and that she is not able to drive or operate heavy machinery until she is 6 months seizure-free Final Clinical Impression(s) / ED Diagnoses   Final diagnoses:  Seizure-like activity [facility])    Rx / [place] Orders ED Discharge Orders    None       Estellar Cadena, Annie Main, MD 02/02/21 [phone]

## 2021-03-03 ENCOUNTER — Other Ambulatory Visit: Payer: Self-pay

## 2021-03-03 ENCOUNTER — Emergency Department (HOSPITAL_COMMUNITY)
Admission: EM | Admit: 2021-03-03 | Discharge: 2021-03-03 | Disposition: A | Discharge status: Home / Self Care | Payer: Medicaid Other | Attending: Emergency Medicine | Admitting: Emergency Medicine

## 2021-03-03 ENCOUNTER — Encounter (HOSPITAL_COMMUNITY): Payer: Self-pay

## 2021-03-03 DIAGNOSIS — G40909 Epilepsy, unspecified, not intractable, without status epilepticus: Secondary | ICD-10-CM | POA: Insufficient documentation

## 2021-03-03 DIAGNOSIS — Z79899 Other long term (current) drug therapy: Secondary | ICD-10-CM | POA: Diagnosis not present

## 2021-03-03 DIAGNOSIS — E119 Type 2 diabetes mellitus without complications: Secondary | ICD-10-CM | POA: Insufficient documentation

## 2021-03-03 DIAGNOSIS — R569 Unspecified convulsions: Secondary | ICD-10-CM

## 2021-03-03 LAB — RAPID URINE DRUG SCREEN, HOSP PERFORMED
Amphetamines: NOT DETECTED
Barbiturates: NOT DETECTED
Benzodiazepines: POSITIVE — AB
Cocaine: POSITIVE — AB
Opiates: NOT DETECTED
Tetrahydrocannabinol: NOT DETECTED

## 2021-03-03 LAB — CBC WITH DIFFERENTIAL/PLATELET
Abs Immature Granulocytes: 0.02 10*3/uL (ref 0.00–0.07)
Basophils Absolute: 0 10*3/uL (ref 0.0–0.1)
Basophils Relative: 1 %
Eosinophils Absolute: 0 10*3/uL (ref 0.0–0.5)
Eosinophils Relative: 1 %
HCT: 38.1 % (ref 36.0–46.0)
Hemoglobin: 12.2 g/dL (ref 12.0–15.0)
Immature Granulocytes: 0 %
Lymphocytes Relative: 34 %
Lymphs Abs: 1.8 10*3/uL (ref 0.7–4.0)
MCH: 30.6 pg (ref 26.0–34.0)
MCHC: 32 g/dL (ref 30.0–36.0)
MCV: 95.5 fL (ref 80.0–100.0)
Monocytes Absolute: 0.3 10*3/uL (ref 0.1–1.0)
Monocytes Relative: 6 %
Neutro Abs: 3 10*3/uL (ref 1.7–7.7)
Neutrophils Relative %: 58 %
Platelets: 192 10*3/uL (ref 150–400)
RBC: 3.99 MIL/uL (ref 3.87–5.11)
RDW: 13.3 % (ref 11.5–15.5)
WBC: 5.2 10*3/uL (ref 4.0–10.5)
nRBC: 0 % (ref 0.0–0.2)

## 2021-03-03 LAB — BASIC METABOLIC PANEL
Anion gap: 8 (ref 5–15)
BUN: 13 mg/dL (ref 6–20)
CO2: 22 mmol/L (ref 22–32)
Calcium: 9.2 mg/dL (ref 8.9–10.3)
Chloride: 107 mmol/L (ref 98–111)
Creatinine, Ser: 0.8 mg/dL (ref 0.44–1.00)
GFR, Estimated: >60 mL/min (ref >60)
Glucose, Bld: 100 mg/dL — ABNORMAL HIGH (ref 70–99)
Potassium: 3.7 mmol/L (ref 3.5–5.1)
Sodium: 137 mmol/L (ref 135–145)

## 2021-03-03 LAB — ETHANOL: Alcohol, Ethyl (B): 126 mg/dL — ABNORMAL HIGH (ref <10)

## 2021-03-03 MED ORDER — LEVETIRACETAM IN NACL 1000 MG/100ML IV SOLN
1000.0000 mg | Freq: Once | INTRAVENOUS | Status: AC
  Administered 2021-03-03: 1000 mg via INTRAVENOUS
  Filled 2021-03-03: qty 100

## 2021-03-03 NOTE — ED Provider Notes (Addendum)
  Physical Exam  BP 102/65   Pulse 78   Temp 98 F (36.7 C) (Oral)   Resp 17   SpO2 94%   Physical Exam  ED Course/Procedures     Procedures  MDM   Assuming care of patient from Dr. Blinda Leatherwood   Patient in the ED for seizures. Workup thus far shows : no acute changes.  Concerning findings are as following none Important pending results are : none  According to Dr. Blinda Leatherwood, plan is to reassess when she is more awake. Had received 2 rounds of versed by EMS, and keppra here. + etoh.    Derwood Kaplan, MD 03/03/21 0715  11:28 AM AO x 3 now. Reports having 3 seizures in the last month.  Does admit to some alcohol use and cocaine use prior to all of those events including last night.  Takes medications as prescribed.  Suspect that the seizure was likely a result of toxin ingestion.  Will discharge.   Derwood Kaplan, MD 03/03/21 [phone]

## 2021-03-03 NOTE — ED Provider Notes (Signed)
[facility] DEPT Provider Note   CSN: [id number] Arrival date & time: 03/03/21  9562     History No chief complaint on file.   Jennifer Crawford is a 45 y.o. female.  Brought to the emergency department for evaluation after seizure.  Patient reportedly had been drinking tonight.  She does have a seizure disorder.  She called a friend and said she felt like she was going to have a seizure and that she was going to drive herself to the hospital.  Friend called 911 and EMS arrived on the scene and found the patient somewhat combative, likely postictal.  She was initially refusing transport.  EMS administered Versed 2.5 mg IM and she agreed to transport.  During transport to the hospital, however, she had another brief generalized tonic-clonic seizure.  This lasted approximately 1 minute.  She was given an additional 2.5 mg of Versed IV.        Past Medical History:  Diagnosis Date  . Anxiety and depression   . Diabetes mellitus without complication ([employer])   . Right flank pain 05/2019  . Right upper quadrant pain 8/22020  . Seizures ([place]) 2015  . Vitamin D deficiency 05/2019    Patient Active Problem List   Diagnosis Date Noted  . Cocaine abuse with cocaine-induced mood disorder ([place]) 11/27/2020  . Alcohol use disorder, moderate, dependence ([place]) 11/27/2020  . Hypoglycemia 06/27/2019  . Hematuria 06/27/2019  . Low back pain with sciatica 05/31/2019  . Left flank pain 05/31/2019  . Intractable headache 05/13/2019  . Right upper quadrant pain 05/13/2019  . Right flank pain 05/13/2019  . Diabetes mellitus type 2 in nonobese ([place]) 06/20/2015  . Elevated blood alcohol level 06/20/2015  . Anxiety and depression 06/20/2015  . Seizure disorder ([place]) 06/19/2015    Past Surgical History:  Procedure Laterality Date  . ABDOMINAL HYSTERECTOMY    . TUBAL LIGATION       OB History   No obstetric history on file.     Family History  Problem  Relation Age of Onset  . Seizures Daughter   . Asthma Son   . Hypertension Father   . Arrhythmia Father        Status post pacemaker insertion  . Hypertension Mother   . Diabetes Mother     Social History   Tobacco Use  . Smoking status: Never Smoker  . Smokeless tobacco: Never Used  Vaping Use  . Vaping Use: Never used  Substance Use Topics  . Alcohol use: Yes    Alcohol/week: 0.0 standard drinks    Comment: Occasional social  . Drug use: No    Home Medications Prior to Admission medications   Medication Sig Start Date End Date Taking? Authorizing Provider  albuterol (PROVENTIL) (2.5 MG/3ML) 0.083% nebulizer solution Take 3 mLs (2.5 mg total) by nebulization every 6 (six) hours as needed for wheezing or shortness of breath. Patient not taking: No sig reported 04/04/20   Truddie Hidden, MD  albuterol (VENTOLIN HFA) 108 (90 Base) MCG/ACT inhaler Inhale 2 puffs into the lungs every 4 (four) hours as needed for wheezing or shortness of breath. Patient not taking: Reported on 02/02/2021 02/10/20   Margarita Mail, PA-C  blood glucose meter kit and supplies Dispense based on patient and insurance preference. Use up to four times daily as directed. (FOR ICD-10 E10.9, E11.9). 05/13/19   Azzie Glatter, FNP  Budesonide (PULMICORT FLEXHALER) 90 MCG/ACT inhaler Inhale 1 puff into the lungs 2 (  two) times daily. Rinse mouth with water and spit after every use. Patient not taking: No sig reported 10/13/20   Augusto Gamble B, NP  ibuprofen (ADVIL) 800 MG tablet Take 1 tablet (800 mg total) by mouth 3 (three) times daily. Patient not taking: No sig reported 08/05/20   Sherwood Gambler, MD  levETIRAcetam (KEPPRA) 1000 MG tablet Take 1 tablet (1,000 mg total) by mouth 2 (two) times daily. 02/02/21   Rancour, Annie Main, MD  naproxen (NAPROSYN) 500 MG tablet Take 1 tablet (500 mg total) by mouth 2 (two) times daily. Patient not taking: No sig reported 12/05/20   Henderly, Britni A, PA-C    Allergies     Tramadol and Acetaminophen  Review of Systems   Review of Systems  Unable to perform ROS: Mental status change    Physical Exam Updated Vital Signs BP 110/71 (BP Location: Left Arm)   Pulse 84   Temp 98 F (36.7 C) (Oral)   Resp 18   SpO2 96%   Physical Exam Vitals and nursing note reviewed.  Constitutional:      General: She is not in acute distress.    Appearance: Normal appearance. She is well-developed.  HENT:     Head: Normocephalic and atraumatic.     Right Ear: Hearing normal.     Left Ear: Hearing normal.     Nose: Nose normal.  Eyes:     Conjunctiva/sclera: Conjunctivae normal.     Pupils: Pupils are equal, round, and reactive to light.  Cardiovascular:     Rate and Rhythm: Regular rhythm.     Heart sounds: S1 normal and S2 normal. No murmur heard. No friction rub. No gallop.   Pulmonary:     Effort: Pulmonary effort is normal. No respiratory distress.     Breath sounds: Normal breath sounds.  Chest:     Chest wall: No tenderness.  Abdominal:     General: Bowel sounds are normal.     Palpations: Abdomen is soft.     Tenderness: There is no abdominal tenderness. There is no guarding or rebound. Negative signs include Murphy's sign and McBurney's sign.     Hernia: No hernia is present.  Musculoskeletal:        General: Normal range of motion.     Cervical back: Normal range of motion and neck supple.  Skin:    General: Skin is warm and dry.     Findings: No rash.  Neurological:     Mental Status: She is alert.     GCS: GCS eye subscore is 3. GCS verbal subscore is 3. GCS motor subscore is 5.     Cranial Nerves: No cranial nerve deficit.     Sensory: No sensory deficit.     Coordination: Coordination normal.  Psychiatric:        Speech: She is noncommunicative.     ED Results / Procedures / Treatments   Labs (all labs ordered are listed, but only abnormal results are displayed) Labs Reviewed  CBC WITH DIFFERENTIAL/PLATELET  BASIC METABOLIC  PANEL  ETHANOL  RAPID URINE DRUG SCREEN, HOSP PERFORMED    EKG EKG Interpretation  Date/Time:  Thursday March 03 2021 06:15:02 EDT Ventricular Rate:  82 PR Interval:  189 QRS Duration: 91 QT Interval:  378 QTC Calculation: 442 R Axis:   62 Text Interpretation: Sinus rhythm Consider left ventricular hypertrophy Borderline T abnormalities, diffuse leads Confirmed by Orpah Greek [phone]) on 03/03/2021 6:25:37 AM   Radiology No results found.  Procedures Procedures   Medications Ordered in ED Medications  levETIRAcetam (KEPPRA) IVPB 1000 mg/100 mL premix (1,000 mg Intravenous New Bag/Given 03/03/21 1657)    ED Course  I have reviewed the triage vital signs and the nursing notes.  Pertinent labs & imaging results that were available during my care of the patient were reviewed by me and considered in my medical decision making (see chart for details).    MDM Rules/Calculators/A&P                          Patient with known seizure disorder presents to the emergency department after a seizure.  Patient likely has been drinking alcohol as well.  Reviewing her records reveals other visits for generalized seizure related to alcohol intake and noncompliance with her Keppra.  Patient is postictal and sedated after administration of Versed by EMS.  Will administer IV Keppra.  Basic work-up and monitor.  Will signout oncoming ER physician to follow.  Final Clinical Impression(s) / ED Diagnoses Final diagnoses:  Seizure [facility])    Rx / DC Orders ED Discharge Orders    None       Lakendrick Paradis, Gwenyth Allegra, MD 03/03/21 [id number]

## 2021-03-03 NOTE — ED Triage Notes (Signed)
Patient arrives via EMS after calling a friend stating she felt like she may have a seizure. Upon EMS arrival pt was found to be unresponsive, once pt became responsive EMS reports she became uncooperative, trying to undo straps and get off of stretcher. EMS reports giving 2.5 mg IM of Versed, afterwhich pt agreed to go to hospital for evaluation.En route pt had a seizure lasting 30-45 seconds, 2.5 mg Versed IV administered.    EMS vitals BP 140/100 HR 110 SPO2 97% CBG 84

## 2021-04-22 ENCOUNTER — Emergency Department (HOSPITAL_COMMUNITY)
Admission: EM | Admit: 2021-04-22 | Discharge: 2021-04-23 | Disposition: A | Discharge status: Home / Self Care | Payer: Medicaid Other | Attending: Emergency Medicine | Admitting: Emergency Medicine

## 2021-04-22 DIAGNOSIS — F10929 Alcohol use, unspecified with intoxication, unspecified: Secondary | ICD-10-CM | POA: Insufficient documentation

## 2021-04-22 DIAGNOSIS — Z20822 Contact with and (suspected) exposure to covid-19: Secondary | ICD-10-CM | POA: Diagnosis not present

## 2021-04-22 DIAGNOSIS — E119 Type 2 diabetes mellitus without complications: Secondary | ICD-10-CM | POA: Diagnosis not present

## 2021-04-22 DIAGNOSIS — R569 Unspecified convulsions: Secondary | ICD-10-CM | POA: Diagnosis present

## 2021-04-22 DIAGNOSIS — G529 Cranial nerve disorder, unspecified: Secondary | ICD-10-CM | POA: Diagnosis not present

## 2021-04-22 DIAGNOSIS — F14929 Cocaine use, unspecified with intoxication, unspecified: Secondary | ICD-10-CM | POA: Diagnosis not present

## 2021-04-22 LAB — CBC WITH DIFFERENTIAL/PLATELET
Abs Immature Granulocytes: 0.01 10*3/uL (ref 0.00–0.07)
Basophils Absolute: 0.1 10*3/uL (ref 0.0–0.1)
Basophils Relative: 1 %
Eosinophils Absolute: 0.1 10*3/uL (ref 0.0–0.5)
Eosinophils Relative: 1 %
HCT: 37.2 % (ref 36.0–46.0)
Hemoglobin: 12.3 g/dL (ref 12.0–15.0)
Immature Granulocytes: 0 %
Lymphocytes Relative: 38 %
Lymphs Abs: 2.4 10*3/uL (ref 0.7–4.0)
MCH: 31.3 pg (ref 26.0–34.0)
MCHC: 33.1 g/dL (ref 30.0–36.0)
MCV: 94.7 fL (ref 80.0–100.0)
Monocytes Absolute: 0.4 10*3/uL (ref 0.1–1.0)
Monocytes Relative: 6 %
Neutro Abs: 3.4 10*3/uL (ref 1.7–7.7)
Neutrophils Relative %: 54 %
Platelets: 190 10*3/uL (ref 150–400)
RBC: 3.93 MIL/uL (ref 3.87–5.11)
RDW: 13.1 % (ref 11.5–15.5)
WBC: 6.3 10*3/uL (ref 4.0–10.5)
nRBC: 0 % (ref 0.0–0.2)

## 2021-04-22 LAB — URINALYSIS, ROUTINE W REFLEX MICROSCOPIC
Bacteria, UA: NONE SEEN
Bilirubin Urine: NEGATIVE
Glucose, UA: NEGATIVE mg/dL
Ketones, ur: NEGATIVE mg/dL
Leukocytes,Ua: NEGATIVE
Nitrite: NEGATIVE
Protein, ur: NEGATIVE mg/dL
Specific Gravity, Urine: 1.001 — ABNORMAL LOW (ref 1.005–1.030)
pH: 6 (ref 5.0–8.0)

## 2021-04-22 LAB — CBG MONITORING, ED: Glucose-Capillary: 79 mg/dL (ref 70–99)

## 2021-04-22 LAB — PREGNANCY, URINE: Preg Test, Ur: NEGATIVE

## 2021-04-22 MED ORDER — LACTATED RINGERS IV BOLUS
1000.0000 mL | Freq: Once | INTRAVENOUS | Status: AC
  Administered 2021-04-22: 1000 mL via INTRAVENOUS

## 2021-04-22 NOTE — ED Triage Notes (Signed)
BIB EMS, N/V/D all day, pt had 2 witnessed seizures at home PTA of EMS. Was given 5mg  Midazolam on scene(IM), EMS reports pt had roughly 10 seizures, mostly right sided, fixed gaze lasting 10--30 secs. Family reports compliant with Keppra. On arrival, EMS gave 2.5mg  of Midazolam IV. 20G in right forearm via EMS

## 2021-04-22 NOTE — ED Notes (Signed)
ED Provider at bedside. 

## 2021-04-22 NOTE — ED Provider Notes (Signed)
[facility] EMERGENCY DEPARTMENT Provider Note   CSN: [id number] Arrival date & time: 04/22/21  2210     History Chief Complaint  Patient presents with   Seizures         Jennifer Crawford is a 45 y.o. female.   Seizures  45 year old female PMHx anxiety, depression, T2DM, seizure (adherent to Keppra regimen), polysubstance use, brought by EMS for seizures.  Patient reportedly had NVD all day, and then had x2 witnessed seizures at home prompting EMS to be called.  They arrived and provided 5 mg midazolam after which she had roughly 10 more seizures "right-sided, fixed gaze lasting 10 to 30 seconds."  EMS administered additional 2.5 mg of IV midazolam after arrival, for concern for more early seizure-like activity.  On chart review, patient noted to have multiple similar presentations, with associated EtOH and cocaine use.  Recent imaging has been negative.  Remainder of history limited by patient's altered mentation. Level 5 caveat applies d/t ams.  History obtained from EMS, chart review.    Past Medical History:  Diagnosis Date   Anxiety and depression    Diabetes mellitus without complication ([place])    Right flank pain 05/2019   Right upper quadrant pain 8/22020   Seizures ([place]) 2015   Vitamin D deficiency 05/2019    Patient Active Problem List   Diagnosis Date Noted   Cocaine abuse with cocaine-induced mood disorder ([place]) 11/27/2020   Alcohol use disorder, moderate, dependence ([place]) 11/27/2020   Hypoglycemia 06/27/2019   Hematuria 06/27/2019   Low back pain with sciatica 05/31/2019   Left flank pain 05/31/2019   Intractable headache 05/13/2019   Right upper quadrant pain 05/13/2019   Right flank pain 05/13/2019   Diabetes mellitus type 2 in nonobese ([place]) 06/20/2015   Elevated blood alcohol level 06/20/2015   Anxiety and depression 06/20/2015   Seizure disorder ([place]) 06/19/2015    Past Surgical History:  Procedure Laterality Date    ABDOMINAL HYSTERECTOMY     TUBAL LIGATION       OB History   No obstetric history on file.     Family History  Problem Relation Age of Onset   Seizures Daughter    Asthma Son    Hypertension Father    Arrhythmia Father        Status post pacemaker insertion   Hypertension Mother    Diabetes Mother     Social History   Tobacco Use   Smoking status: Never   Smokeless tobacco: Never  Vaping Use   Vaping Use: Never used  Substance Use Topics   Alcohol use: Yes    Alcohol/week: 0.0 standard drinks    Comment: Occasional social   Drug use: No    Home Medications Prior to Admission medications   Medication Sig Start Date End Date Taking? Authorizing Provider  albuterol (PROVENTIL) (2.5 MG/3ML) 0.083% nebulizer solution Take 3 mLs (2.5 mg total) by nebulization every 6 (six) hours as needed for wheezing or shortness of breath. Patient not taking: No sig reported 04/04/20   Truddie Hidden, MD  albuterol (VENTOLIN HFA) 108 (90 Base) MCG/ACT inhaler Inhale 2 puffs into the lungs every 4 (four) hours as needed for wheezing or shortness of breath. Patient not taking: Reported on 02/02/2021 02/10/20   Margarita Mail, PA-C  blood glucose meter kit and supplies Dispense based on patient and insurance preference. Use up to four times daily as directed. (FOR ICD-10 E10.9, E11.9). 05/13/19   Azzie Glatter,  FNP  Budesonide (PULMICORT FLEXHALER) 90 MCG/ACT inhaler Inhale 1 puff into the lungs 2 (two) times daily. Rinse mouth with water and spit after every use. Patient not taking: No sig reported 10/13/20   Augusto Gamble B, NP  ibuprofen (ADVIL) 800 MG tablet Take 1 tablet (800 mg total) by mouth 3 (three) times daily. Patient not taking: No sig reported 08/05/20   Sherwood Gambler, MD  levETIRAcetam (KEPPRA) 1000 MG tablet Take 1 tablet (1,000 mg total) by mouth 2 (two) times daily. 02/02/21   Rancour, Annie Main, MD  naproxen (NAPROSYN) 500 MG tablet Take 1 tablet (500 mg total) by mouth 2  (two) times daily. Patient not taking: No sig reported 12/05/20   Henderly, Britni A, PA-C    Allergies    Tramadol and Acetaminophen  Review of Systems   Review of Systems  Unable to perform ROS: Mental status change  Neurological:  Positive for seizures.   Physical Exam Updated Vital Signs BP 109/74   Pulse 72   Temp 97.9 F (36.6 C) (Axillary)   Resp 18   SpO2 100%   Physical Exam Vitals and nursing note reviewed.  Constitutional:      General: She is not in acute distress.    Appearance: She is not diaphoretic.     Comments: Somnolent  HENT:     Head: Normocephalic and atraumatic.     Right Ear: External ear normal.     Left Ear: External ear normal.     Nose: Nose normal.     Mouth/Throat:     Mouth: Mucous membranes are moist.     Pharynx: Oropharynx is clear.  Eyes:     Extraocular Movements: Extraocular movements intact.     Conjunctiva/sclera: Conjunctivae normal.     Pupils: Pupils are equal, round, and reactive to light.  Cardiovascular:     Rate and Rhythm: Normal rate and regular rhythm.     Pulses: Normal pulses.     Heart sounds: No murmur heard.   No friction rub. No gallop.  Pulmonary:     Effort: Pulmonary effort is normal.     Breath sounds: No stridor. No wheezing, rhonchi or rales.  Abdominal:     General: There is no distension.     Palpations: Abdomen is soft.     Tenderness: There is no abdominal tenderness. There is no guarding or rebound.  Musculoskeletal:        General: No deformity or signs of injury.     Cervical back: Normal range of motion. No rigidity.     Right lower leg: No edema.     Left lower leg: No edema.  Skin:    General: Skin is warm and dry.  Neurological:     Cranial Nerves: Cranial nerve deficit present.     Deep Tendon Reflexes: Reflexes normal.     Comments: Mental status: nonverbal, slow to obey commands Speech: Not speaking CN II: Blinks to threat CN III/IV/VI: PERRL, EOMI, although slow to follow  finger CN V: Not tested CN VII: no facial droop CN VIII: no nystagmus, audition grossly intact speech VN IX/X: Tolerating secretions CN XI: trapezius and SCM motor function intact CN XII: midline tongue w/o atrophy or fasciculation Spontaneously moving all 4 extremities intermittently Coordination: Untested Gait: Not tested     ED Results / Procedures / Treatments   Labs (all labs ordered are listed, but only abnormal results are displayed) Labs Reviewed  URINALYSIS, ROUTINE W REFLEX MICROSCOPIC - Abnormal; Notable  for the following components:      Result Value   Color, Urine COLORLESS (*)    Specific Gravity, Urine 1.001 (*)    Hgb urine dipstick SMALL (*)    All other components within normal limits  RESP PANEL BY RT-PCR (FLU A&B, COVID) ARPGX2  PREGNANCY, URINE  CBC WITH DIFFERENTIAL/PLATELET  RAPID URINE DRUG SCREEN, HOSP PERFORMED  COMPREHENSIVE METABOLIC PANEL  LIPASE, BLOOD  LEVETIRACETAM LEVEL  ETHANOL  CBG MONITORING, ED    EKG EKG Interpretation  Date/Time:  Friday April 22 2021 22:13:41 EDT Ventricular Rate:  97 PR Interval:  145 QRS Duration: 81 QT Interval:  482 QTC Calculation: 613 R Axis:   64 Text Interpretation: Sinus or ectopic atrial rhythm Abnormal R-wave progression, early transition Probable LVH with secondary repol abnrm Spacebar Confirmed by Ripley Fraise [id number] on 04/22/2021 11:33:44 PM  Radiology No results found.  Procedures Procedures   Medications Ordered in ED Medications  lactated ringers bolus 1,000 mL (1,000 mLs Intravenous New Bag/Given 04/22/21 2307)    ED Course  I have reviewed the triage vital signs and the nursing notes.  Pertinent labs & imaging results that were available during my care of the patient were reviewed by me and considered in my medical decision making (see chart for details).    MDM Rules/Calculators/A&P                          This is a 45 year old female PMHx polysubstance use and seizure  disorder on Keppra, presenting for multiple witnessed seizure-like events.  Received 7.5 mg Versed total by EMS.  Has had similar visits in the past, with alcohol and cocaine intoxication noted.  On exam, somnolent but with no focal neurologic deficits.  Initial interventions: LR bolus administered  All studies independently reviewed by myself, d/w the attending physician, factored into my MDM. -EKG: NSR 97 bpm, normal axis, normal intervals, repolarization abnormalities, most likely 2/2 LVH; essentially unchanged compared to prior from 03/07/2021 -Initial BGL 79 -Unremarkable: UPT, UA, CBCd -Pending: UDS, respiratory panel, CMP, lipase, ethanol, Keppra level  Presentation patient most consistent with seizure versus intoxication versus side effect of Versed administration.  No focal neurodeficit to suggest localized CNS/PNS lesion.  No tearing chest pain to suggest dissection.  CMP pending to work-up electrolyte derangement.  She did have some epigastric tenderness, lipase pending to evaluate for pancreatitis.  No evidence of traumatic injury on my exam.  Given her multiple presentations for similar symptoms, feel that if labs are WNL, patient likely safe to go home.  Patient HDS on reevaluation part time.  Course of care and plan were discussed with oncoming team, to patient care was transferred.  Final Clinical Impression(s) / ED Diagnoses Final diagnoses:  Seizure-like activity [facility])    Rx / [place] Orders ED Discharge Orders     None        Levin Bacon, MD 04/22/21 2348    Ripley Fraise, MD 04/23/21 [phone]

## 2021-04-23 ENCOUNTER — Other Ambulatory Visit: Payer: Self-pay

## 2021-04-23 LAB — RAPID URINE DRUG SCREEN, HOSP PERFORMED
Amphetamines: NOT DETECTED
Barbiturates: NOT DETECTED
Benzodiazepines: POSITIVE — AB
Cocaine: POSITIVE — AB
Opiates: NOT DETECTED
Tetrahydrocannabinol: NOT DETECTED

## 2021-04-23 LAB — COMPREHENSIVE METABOLIC PANEL
ALT: 21 U/L (ref 0–44)
AST: 21 U/L (ref 15–41)
Albumin: 4.1 g/dL (ref 3.5–5.0)
Alkaline Phosphatase: 57 U/L (ref 38–126)
Anion gap: 10 (ref 5–15)
BUN: 10 mg/dL (ref 6–20)
CO2: 19 mmol/L — ABNORMAL LOW (ref 22–32)
Calcium: 9.4 mg/dL (ref 8.9–10.3)
Chloride: 108 mmol/L (ref 98–111)
Creatinine, Ser: 0.85 mg/dL (ref 0.44–1.00)
GFR, Estimated: >60 mL/min (ref >60)
Glucose, Bld: 81 mg/dL (ref 70–99)
Potassium: 3.7 mmol/L (ref 3.5–5.1)
Sodium: 137 mmol/L (ref 135–145)
Total Bilirubin: 0.5 mg/dL (ref 0.3–1.2)
Total Protein: 7.6 g/dL (ref 6.5–8.1)

## 2021-04-23 LAB — RESP PANEL BY RT-PCR (FLU A&B, COVID) ARPGX2
Influenza A by PCR: NEGATIVE
Influenza B by PCR: NEGATIVE
SARS Coronavirus 2 by RT PCR: NEGATIVE

## 2021-04-23 LAB — ETHANOL: Alcohol, Ethyl (B): 106 mg/dL — ABNORMAL HIGH (ref <10)

## 2021-04-23 LAB — LIPASE, BLOOD: Lipase: 29 U/L (ref 11–51)

## 2021-04-23 NOTE — Discharge Instructions (Addendum)
Please be aware you may have another seizure ° °Do not drive until seen by your physician for your condition ° °Do not climb ladders/roofs/trees as a seizure can occur at that height and cause serious harm ° °Do not bathe/swim alone as a seizure can occur and cause serious harm ° °Please followup with your physician or neurologist for further testing and possible treatment ° ° °

## 2021-04-24 ENCOUNTER — Other Ambulatory Visit: Payer: Self-pay

## 2021-04-24 ENCOUNTER — Encounter (HOSPITAL_COMMUNITY): Payer: Self-pay

## 2021-04-24 ENCOUNTER — Ambulatory Visit (HOSPITAL_COMMUNITY)
Admission: EM | Admit: 2021-04-24 | Discharge: 2021-04-24 | Disposition: A | Discharge status: Home / Self Care | Payer: Medicaid Other | Attending: Emergency Medicine | Admitting: Emergency Medicine

## 2021-04-24 DIAGNOSIS — B349 Viral infection, unspecified: Secondary | ICD-10-CM | POA: Diagnosis present

## 2021-04-24 DIAGNOSIS — Z20822 Contact with and (suspected) exposure to covid-19: Secondary | ICD-10-CM | POA: Diagnosis present

## 2021-04-24 NOTE — ED Triage Notes (Signed)
PT had a neg COVID test in ED 04-22-21

## 2021-04-24 NOTE — ED Provider Notes (Signed)
[facility]    CSN: [id number] Arrival date & time: 04/24/21  1140      History   Chief Complaint Chief Complaint  Patient presents with   Exposure to STD    HPI Jennifer Crawford is a 45 y.o. female.   Patient here for evaluation of body aches, sore throat, cough, and congestion that have been ongoing for the past several days.  Reports symptoms have been worsening.  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  Denies any trauma, injury, or other precipitating event.  Denies any specific alleviating or aggravating factors.  Denies any N/V/D, numbness, tingling, weakness, abdominal pain.      Exposure to STD Associated symptoms include shortness of breath.   Past Medical History:  Diagnosis Date   Anxiety and depression    Diabetes mellitus without complication (HCC)    Right flank pain 05/2019   Right upper quadrant pain 8/22020   Seizures (HCC) 2015   Vitamin D deficiency 05/2019    Patient Active Problem List   Diagnosis Date Noted   Cocaine abuse with cocaine-induced mood disorder (HCC) 11/27/2020   Alcohol use disorder, moderate, dependence (HCC) 11/27/2020   Hypoglycemia 06/27/2019   Hematuria 06/27/2019   Low back pain with sciatica 05/31/2019   Left flank pain 05/31/2019   Intractable headache 05/13/2019   Right upper quadrant pain 05/13/2019   Right flank pain 05/13/2019   Diabetes mellitus type 2 in nonobese (HCC) 06/20/2015   Elevated blood alcohol level 06/20/2015   Anxiety and depression 06/20/2015   Seizure disorder (HCC) 06/19/2015    Past Surgical History:  Procedure Laterality Date   ABDOMINAL HYSTERECTOMY     TUBAL LIGATION      OB History   No obstetric history on file.      Home Medications    Prior to Admission medications   Medication Sig Start Date End Date Taking? Authorizing Provider  albuterol (PROVENTIL) (2.5 MG/3ML) 0.083%  nebulizer solution Take 3 mLs (2.5 mg total) by nebulization every 6 (six) hours as needed for wheezing or shortness of breath. Patient not taking: No sig reported 04/04/20   Pollyann Savoy, MD  albuterol (VENTOLIN HFA) 108 (90 Base) MCG/ACT inhaler Inhale 2 puffs into the lungs every 4 (four) hours as needed for wheezing or shortness of breath. Patient not taking: Reported on 02/02/2021 02/10/20   Arthor Captain, PA-C  blood glucose meter kit and supplies Dispense based on patient and insurance preference. Use up to four times daily as directed. (FOR ICD-10 E10.9, E11.9). 05/13/19   Kallie Locks, FNP  Budesonide (PULMICORT FLEXHALER) 90 MCG/ACT inhaler Inhale 1 puff into the lungs 2 (two) times daily. Rinse mouth with water and spit after every use. Patient not taking: No sig reported 10/13/20   Linus Mako B, NP  ibuprofen (ADVIL) 800 MG tablet Take 1 tablet (800 mg total) by mouth 3 (three) times daily. Patient not taking: No sig reported 08/05/20   Pricilla Loveless, MD  levETIRAcetam (KEPPRA) 1000 MG tablet Take 1 tablet (1,000 mg total) by mouth 2 (two) times daily. 02/02/21   Rancour, Jeannett Senior, MD  naproxen (NAPROSYN) 500 MG tablet Take 1 tablet (500 mg total) by mouth 2 (two) times daily. Patient not taking: No sig reported 12/05/20   Henderly, Britni A, PA-C    Family History Family History  Problem Relation Age of Onset   Seizures Daughter  Asthma Son    Hypertension Father    Arrhythmia Father        Status post pacemaker insertion   Hypertension Mother    Diabetes Mother     Social History Social History   Tobacco Use   Smoking status: Never   Smokeless tobacco: Never  Vaping Use   Vaping Use: Never used  Substance Use Topics   Alcohol use: Yes    Alcohol/week: 0.0 standard drinks    Comment: Occasional social   Drug use: No     Allergies   Tramadol and Acetaminophen   Review of Systems Review of Systems  Constitutional:  Positive for fatigue and fever.   HENT:  Positive for congestion and sore throat.   Respiratory:  Positive for cough and shortness of breath.   Musculoskeletal:  Positive for myalgias.  All other systems reviewed and are negative.   Physical Exam Triage Vital Signs ED Triage Vitals  Enc Vitals Group     BP 04/24/21 1309 115/77     Pulse Rate 04/24/21 1309 61     Resp 04/24/21 1309 16     Temp 04/24/21 1309 98.2 F (36.8 C)     Temp Source 04/24/21 1309 Oral     SpO2 04/24/21 1309 100 %     Weight --      Height --      Head Circumference --      Peak Flow --      Pain Score 04/24/21 1307 8     Pain Loc --      Pain Edu? --      Excl. in [place]? --    No data found.  Updated Vital Signs BP 115/77 (BP Location: Left Arm)   Pulse 61   Temp 98.2 F (36.8 C) (Oral)   Resp 16   SpO2 100%   Visual Acuity Right Eye Distance:   Left Eye Distance:   Bilateral Distance:    Right Eye Near:   Left Eye Near:    Bilateral Near:     Physical Exam Vitals and nursing note reviewed.  Constitutional:      General: She is not in acute distress.    Appearance: Normal appearance. She is not ill-appearing, toxic-appearing or diaphoretic.  HENT:     Head: Normocephalic and atraumatic.     Nose: Congestion present.     Mouth/Throat:     Pharynx: Posterior oropharyngeal erythema present. No oropharyngeal exudate.  Eyes:     Conjunctiva/sclera: Conjunctivae normal.  Cardiovascular:     Rate and Rhythm: Normal rate.     Pulses: Normal pulses.     Heart sounds: Normal heart sounds.  Pulmonary:     Effort: Pulmonary effort is normal.     Breath sounds: Normal breath sounds.  Abdominal:     General: Abdomen is flat.  Musculoskeletal:        General: Normal range of motion.     Cervical back: Normal range of motion.  Skin:    General: Skin is warm and dry.  Neurological:     General: No focal deficit present.     Mental Status: She is alert and oriented to person, place, and time.  Psychiatric:        Mood and  Affect: Mood normal.     UC Treatments / Results  Labs (all labs ordered are listed, but only abnormal results are displayed) Labs Reviewed  SARS CORONAVIRUS 2 (TAT 6-24 HRS)    EKG  Radiology No results found.  Procedures Procedures (including critical care time)  Medications Ordered in UC Medications - No data to display  Initial Impression / Assessment and Plan / UC Course  I have reviewed the triage vital signs and the nursing notes.  Pertinent labs & imaging results that were available during my care of the patient were reviewed by me and considered in my medical decision making (see chart for details).    Assessment negative for red flags or concerns.  COVID test pending.  Discussed need to quarantine while waiting on results and for at least 5 days from symptom onset if test is positive.  Work note supplied to patient.  Discussed conservative symptom management as described in discharge instructions.  Follow-up with primary care as needed. Final Clinical Impressions(s) / UC Diagnoses   Final diagnoses:  Exposure to COVID-19 virus  Viral illness     Discharge Instructions      We will contact you if your COVID test is positive.  Please quarantine while you wait for the results.  If your test is negative you may resume normal activities.  If your test is positive please continue to quarantine for at least 5 days from your symptom onset or until you are without a fever for at least 24 hours after the medications.  You can take Tylenol and/or Ibuprofen as needed for fever reduction and pain relief.   For cough: honey 1/2 to 1 teaspoon (you can dilute the honey in water or another fluid).  You can also use guaifenesin and dextromethorphan for cough. You can use a humidifier for chest congestion and cough.  If you don't have a humidifier, you can sit in the bathroom with the hot shower running.     For sore throat: try warm salt water gargles, cepacol lozenges, throat  spray, warm tea or water with lemon/honey, popsicles or ice, or OTC cold relief medicine for throat discomfort.    For congestion: take a daily anti-histamine like Zyrtec, Claritin, and a oral decongestant, such as pseudoephedrine.  You can also use Flonase 1-2 sprays in each nostril daily.    It is important to stay hydrated: drink plenty of fluids (water, gatorade/powerade/pedialyte, juices, or teas) to keep your throat moisturized and help further relieve irritation/discomfort.   Return or go to the Emergency Department if symptoms worsen or do not improve in the next few days.      ED Prescriptions   None    PDMP not reviewed this encounter.   Pearson Forster, NP 04/24/21 1358

## 2021-04-24 NOTE — ED Triage Notes (Signed)
Pt present exposure to covid, she complains of body aches and sore throat with coughing and deep congestion. Symptom started yesterday. Pt state that some residents are positive for covid.

## 2021-04-24 NOTE — Discharge Instructions (Addendum)

## 2021-04-25 ENCOUNTER — Encounter: Payer: Self-pay | Admitting: Neurology

## 2021-04-25 LAB — SARS CORONAVIRUS 2 (TAT 6-24 HRS): SARS Coronavirus 2: NEGATIVE

## 2021-05-17 ENCOUNTER — Emergency Department (HOSPITAL_COMMUNITY)
Admission: EM | Admit: 2021-05-17 | Discharge: 2021-05-18 | Disposition: A | Discharge status: Home / Self Care | Payer: Medicaid Other | Attending: Emergency Medicine | Admitting: Emergency Medicine

## 2021-05-17 DIAGNOSIS — E119 Type 2 diabetes mellitus without complications: Secondary | ICD-10-CM | POA: Insufficient documentation

## 2021-05-17 DIAGNOSIS — Z79899 Other long term (current) drug therapy: Secondary | ICD-10-CM | POA: Diagnosis not present

## 2021-05-17 DIAGNOSIS — R569 Unspecified convulsions: Secondary | ICD-10-CM | POA: Diagnosis present

## 2021-05-17 MED ORDER — LEVETIRACETAM IN NACL 1000 MG/100ML IV SOLN
1000.0000 mg | Freq: Once | INTRAVENOUS | Status: AC
  Administered 2021-05-18: 1000 mg via INTRAVENOUS
  Filled 2021-05-17: qty 100

## 2021-05-18 LAB — COMPREHENSIVE METABOLIC PANEL
ALT: 20 U/L (ref 0–44)
AST: 19 U/L (ref 15–41)
Albumin: 4 g/dL (ref 3.5–5.0)
Alkaline Phosphatase: 57 U/L (ref 38–126)
Anion gap: 8 (ref 5–15)
BUN: 9 mg/dL (ref 6–20)
CO2: 23 mmol/L (ref 22–32)
Calcium: 9.4 mg/dL (ref 8.9–10.3)
Chloride: 106 mmol/L (ref 98–111)
Creatinine, Ser: 0.94 mg/dL (ref 0.44–1.00)
GFR, Estimated: >60 mL/min (ref >60)
Glucose, Bld: 94 mg/dL (ref 70–99)
Potassium: 3.6 mmol/L (ref 3.5–5.1)
Sodium: 137 mmol/L (ref 135–145)
Total Bilirubin: 0.8 mg/dL (ref 0.3–1.2)
Total Protein: 7.8 g/dL (ref 6.5–8.1)

## 2021-05-18 LAB — CBC WITH DIFFERENTIAL/PLATELET
Abs Immature Granulocytes: 0.01 10*3/uL (ref 0.00–0.07)
Basophils Absolute: 0 10*3/uL (ref 0.0–0.1)
Basophils Relative: 1 %
Eosinophils Absolute: 0.1 10*3/uL (ref 0.0–0.5)
Eosinophils Relative: 1 %
HCT: 38 % (ref 36.0–46.0)
Hemoglobin: 12.3 g/dL (ref 12.0–15.0)
Immature Granulocytes: 0 %
Lymphocytes Relative: 34 %
Lymphs Abs: 2.1 10*3/uL (ref 0.7–4.0)
MCH: 30.8 pg (ref 26.0–34.0)
MCHC: 32.4 g/dL (ref 30.0–36.0)
MCV: 95.2 fL (ref 80.0–100.0)
Monocytes Absolute: 0.4 10*3/uL (ref 0.1–1.0)
Monocytes Relative: 6 %
Neutro Abs: 3.6 10*3/uL (ref 1.7–7.7)
Neutrophils Relative %: 58 %
Platelets: 184 10*3/uL (ref 150–400)
RBC: 3.99 MIL/uL (ref 3.87–5.11)
RDW: 13 % (ref 11.5–15.5)
WBC: 6.1 10*3/uL (ref 4.0–10.5)
nRBC: 0 % (ref 0.0–0.2)

## 2021-05-18 LAB — CBG MONITORING, ED: Glucose-Capillary: 102 mg/dL — ABNORMAL HIGH (ref 70–99)

## 2021-05-18 NOTE — ED Provider Notes (Signed)
[facility] EMERGENCY DEPARTMENT Provider Note   CSN: [id number] Arrival date & time: 05/17/21  2345     History Chief Complaint  Patient presents with   Seizures    Jennifer Crawford is a 45 y.o. female.  45 year old female with a past medical history of seizures presents status post seizure activity.  Had 2 events per EMS 1 prior to arrival and then 1 tonic-clonic activity resolved with 5 mg of Versed.  Postictal on arrival.  Level 5 caveat altered mental status.   Seizures     Past Medical History:  Diagnosis Date   Anxiety and depression    Diabetes mellitus without complication ([place])    Right flank pain 05/2019   Right upper quadrant pain 8/22020   Seizures ([place]) 2015   Vitamin D deficiency 05/2019    Patient Active Problem List   Diagnosis Date Noted   Cocaine abuse with cocaine-induced mood disorder ([place]) 11/27/2020   Alcohol use disorder, moderate, dependence ([place]) 11/27/2020   Hypoglycemia 06/27/2019   Hematuria 06/27/2019   Low back pain with sciatica 05/31/2019   Left flank pain 05/31/2019   Intractable headache 05/13/2019   Right upper quadrant pain 05/13/2019   Right flank pain 05/13/2019   Diabetes mellitus type 2 in nonobese ([place]) 06/20/2015   Elevated blood alcohol level 06/20/2015   Anxiety and depression 06/20/2015   Seizure disorder ([place]) 06/19/2015    Past Surgical History:  Procedure Laterality Date   ABDOMINAL HYSTERECTOMY     TUBAL LIGATION       OB History   No obstetric history on file.     Family History  Problem Relation Age of Onset   Seizures Daughter    Asthma Son    Hypertension Father    Arrhythmia Father        Status post pacemaker insertion   Hypertension Mother    Diabetes Mother     Social History   Tobacco Use   Smoking status: Never   Smokeless tobacco: Never  Vaping Use   Vaping Use: Never used  Substance Use Topics   Alcohol use: Yes    Alcohol/week: 0.0 standard drinks     Comment: Occasional social   Drug use: No    Home Medications Prior to Admission medications   Medication Sig Start Date End Date Taking? Authorizing Provider  albuterol (PROVENTIL) (2.5 MG/3ML) 0.083% nebulizer solution Take 3 mLs (2.5 mg total) by nebulization every 6 (six) hours as needed for wheezing or shortness of breath. Patient not taking: No sig reported 04/04/20   Truddie Hidden, MD  albuterol (VENTOLIN HFA) 108 (90 Base) MCG/ACT inhaler Inhale 2 puffs into the lungs every 4 (four) hours as needed for wheezing or shortness of breath. Patient not taking: Reported on 02/02/2021 02/10/20   Margarita Mail, PA-C  blood glucose meter kit and supplies Dispense based on patient and insurance preference. Use up to four times daily as directed. (FOR ICD-10 E10.9, E11.9). 05/13/19   Azzie Glatter, FNP  Budesonide (PULMICORT FLEXHALER) 90 MCG/ACT inhaler Inhale 1 puff into the lungs 2 (two) times daily. Rinse mouth with water and spit after every use. Patient not taking: No sig reported 10/13/20   Augusto Gamble B, NP  ibuprofen (ADVIL) 800 MG tablet Take 1 tablet (800 mg total) by mouth 3 (three) times daily. Patient not taking: No sig reported 08/05/20   Sherwood Gambler, MD  levETIRAcetam (KEPPRA) 1000 MG tablet Take 1 tablet (1,000 mg total) by  mouth 2 (two) times daily. 02/02/21   Rancour, Annie Main, MD  naproxen (NAPROSYN) 500 MG tablet Take 1 tablet (500 mg total) by mouth 2 (two) times daily. Patient not taking: No sig reported 12/05/20   Henderly, Britni A, PA-C    Allergies    Tramadol and Acetaminophen  Review of Systems   Review of Systems  Unable to perform ROS: Mental status change  Neurological:  Positive for seizures.   Physical Exam Updated Vital Signs BP 137/80   Pulse 84   Resp (!) 24   SpO2 100%   Physical Exam Vitals and nursing note reviewed.  Constitutional:      General: She is not in acute distress.    Appearance: She is well-developed. She is not  diaphoretic.  HENT:     Head: Normocephalic and atraumatic.  Eyes:     Pupils: Pupils are equal, round, and reactive to light.  Cardiovascular:     Rate and Rhythm: Normal rate and regular rhythm.     Heart sounds: No murmur heard.   No friction rub. No gallop.  Pulmonary:     Effort: Pulmonary effort is normal.     Breath sounds: No wheezing or rales.  Abdominal:     General: There is no distension.     Palpations: Abdomen is soft.     Tenderness: There is no abdominal tenderness.  Musculoskeletal:        General: No tenderness.     Cervical back: Normal range of motion and neck supple.  Skin:    General: Skin is warm and dry.  Neurological:     GCS: GCS eye subscore is 2. GCS verbal subscore is 2. GCS motor subscore is 5.     Comments: Localizes to painful stimuli.  Moves all 4 extremities spontaneously.  No signs of trauma.    ED Results / Procedures / Treatments   Labs (all labs ordered are listed, but only abnormal results are displayed) Labs Reviewed  CBG MONITORING, ED - Abnormal; Notable for the following components:      Result Value   Glucose-Capillary 102 (*)    All other components within normal limits  CBC WITH DIFFERENTIAL/PLATELET  COMPREHENSIVE METABOLIC PANEL  LEVETIRACETAM LEVEL    EKG EKG Interpretation  Date/Time:  Tuesday May 17 2021 23:51:51 EDT Ventricular Rate:  83 PR Interval:  184 QRS Duration: 88 QT Interval:  357 QTC Calculation: 420 R Axis:   54 Text Interpretation: Sinus rhythm Consider left ventricular hypertrophy Borderline T abnormalities, diffuse leads No significant change since last tracing Confirmed by Deno Etienne [phone]) on 05/18/2021 12:09:18 AM  Radiology No results found.  Procedures Procedures   Medications Ordered in ED Medications  levETIRAcetam (KEPPRA) IVPB 1000 mg/100 mL premix (1,000 mg Intravenous New Bag/Given 05/18/21 0013)    ED Course  I have reviewed the triage vital signs and the nursing  notes.  Pertinent labs & imaging results that were available during my care of the patient were reviewed by me and considered in my medical decision making (see chart for details).    MDM Rules/Calculators/A&P                           45 yo F with a chief complaints of seizure activity.  Has a history of seizures and had 1 prior to EMS arrival on then 1 while in route.  Given 5 mg of Versed.  Sleepy on arrival.  We will  give a bolus dose of Keppra.  Blood work.  Reassess.  Patient is now awake and alert.  She feels somewhat sleepy but denies any other complaints.  Ambulatory tolerating by mouth.  Will discharge home.  3:17 AM:  I have discussed the diagnosis/risks/treatment options with the patient and believe the pt to be eligible for discharge home to follow-up with Neuro, PCP. We also discussed returning to the ED immediately if new or worsening sx occur. We discussed the sx which are most concerning (e.g., sudden worsening pain, fever, inability to tolerate by mouth) that necessitate immediate return. Medications administered to the patient during their visit and any new prescriptions provided to the patient are listed below.  Medications given during this visit Medications  levETIRAcetam (KEPPRA) IVPB 1000 mg/100 mL premix (1,000 mg Intravenous New Bag/Given 05/18/21 0013)     The patient appears reasonably screen and/or stabilized for discharge and I doubt any other medical condition or other [facility] requiring further screening, evaluation, or treatment in the ED at this time prior to discharge.   Final Clinical Impression(s) / ED Diagnoses Final diagnoses:  Seizure [facility])    Rx / DC Orders ED Discharge Orders          Ordered    Ambulatory referral to Neurology        05/18/21 [place], [place], DO 05/18/21 [phone]

## 2021-05-18 NOTE — Discharge Instructions (Addendum)
When you have a seizure and you have a history of seizures is called a breakthrough seizure.  Typically as long as you come back to normal there is no significant thing that needs to be done from an emergent standpoint.  It something that you need to discuss with your neurologist and see if they want to change her medications.  I do not see any neurology notes in our system, I have referred you to our neurologists.

## 2021-06-10 ENCOUNTER — Ambulatory Visit: Payer: Medicaid Other | Admitting: Neurology

## 2021-07-02 ENCOUNTER — Emergency Department (HOSPITAL_COMMUNITY)
Admission: EM | Admit: 2021-07-02 | Discharge: 2021-07-02 | Disposition: A | Discharge status: Home / Self Care | Payer: Medicaid Other | Attending: Emergency Medicine | Admitting: Emergency Medicine

## 2021-07-02 ENCOUNTER — Other Ambulatory Visit: Payer: Self-pay

## 2021-07-02 ENCOUNTER — Encounter (HOSPITAL_COMMUNITY): Payer: Self-pay

## 2021-07-02 DIAGNOSIS — Z79899 Other long term (current) drug therapy: Secondary | ICD-10-CM | POA: Diagnosis not present

## 2021-07-02 DIAGNOSIS — R4182 Altered mental status, unspecified: Secondary | ICD-10-CM | POA: Diagnosis present

## 2021-07-02 DIAGNOSIS — F1910 Other psychoactive substance abuse, uncomplicated: Secondary | ICD-10-CM | POA: Insufficient documentation

## 2021-07-02 DIAGNOSIS — Z8669 Personal history of other diseases of the nervous system and sense organs: Secondary | ICD-10-CM | POA: Diagnosis not present

## 2021-07-02 DIAGNOSIS — Z87898 Personal history of other specified conditions: Secondary | ICD-10-CM

## 2021-07-02 DIAGNOSIS — R400 Somnolence: Secondary | ICD-10-CM | POA: Diagnosis not present

## 2021-07-02 LAB — CBC WITH DIFFERENTIAL/PLATELET
Abs Immature Granulocytes: 0.02 10*3/uL (ref 0.00–0.07)
Basophils Absolute: 0 10*3/uL (ref 0.0–0.1)
Basophils Relative: 0 %
Eosinophils Absolute: 0 10*3/uL (ref 0.0–0.5)
Eosinophils Relative: 1 %
HCT: 36.2 % (ref 36.0–46.0)
Hemoglobin: 11.8 g/dL — ABNORMAL LOW (ref 12.0–15.0)
Immature Granulocytes: 0 %
Lymphocytes Relative: 28 %
Lymphs Abs: 1.6 10*3/uL (ref 0.7–4.0)
MCH: 31.1 pg (ref 26.0–34.0)
MCHC: 32.6 g/dL (ref 30.0–36.0)
MCV: 95.3 fL (ref 80.0–100.0)
Monocytes Absolute: 0.3 10*3/uL (ref 0.1–1.0)
Monocytes Relative: 5 %
Neutro Abs: 3.8 10*3/uL (ref 1.7–7.7)
Neutrophils Relative %: 66 %
Platelets: 199 10*3/uL (ref 150–400)
RBC: 3.8 MIL/uL — ABNORMAL LOW (ref 3.87–5.11)
RDW: 13 % (ref 11.5–15.5)
WBC: 5.7 10*3/uL (ref 4.0–10.5)
nRBC: 0 % (ref 0.0–0.2)

## 2021-07-02 LAB — URINALYSIS, ROUTINE W REFLEX MICROSCOPIC
Bacteria, UA: NONE SEEN
Bilirubin Urine: NEGATIVE
Glucose, UA: NEGATIVE mg/dL
Ketones, ur: NEGATIVE mg/dL
Leukocytes,Ua: NEGATIVE
Nitrite: NEGATIVE
Protein, ur: NEGATIVE mg/dL
Specific Gravity, Urine: 1.002 — ABNORMAL LOW (ref 1.005–1.030)
pH: 5 (ref 5.0–8.0)

## 2021-07-02 LAB — COMPREHENSIVE METABOLIC PANEL
ALT: 19 U/L (ref 0–44)
AST: 22 U/L (ref 15–41)
Albumin: 4.8 g/dL (ref 3.5–5.0)
Alkaline Phosphatase: 60 U/L (ref 38–126)
Anion gap: 9 (ref 5–15)
BUN: 10 mg/dL (ref 6–20)
CO2: 23 mmol/L (ref 22–32)
Calcium: 9.6 mg/dL (ref 8.9–10.3)
Chloride: 108 mmol/L (ref 98–111)
Creatinine, Ser: 0.92 mg/dL (ref 0.44–1.00)
GFR, Estimated: >60 mL/min (ref >60)
Glucose, Bld: 92 mg/dL (ref 70–99)
Potassium: 3.4 mmol/L — ABNORMAL LOW (ref 3.5–5.1)
Sodium: 140 mmol/L (ref 135–145)
Total Bilirubin: 0.4 mg/dL (ref 0.3–1.2)
Total Protein: 8.6 g/dL — ABNORMAL HIGH (ref 6.5–8.1)

## 2021-07-02 LAB — RAPID URINE DRUG SCREEN, HOSP PERFORMED
Amphetamines: NOT DETECTED
Barbiturates: POSITIVE — AB
Benzodiazepines: POSITIVE — AB
Cocaine: POSITIVE — AB
Opiates: NOT DETECTED
Tetrahydrocannabinol: NOT DETECTED

## 2021-07-02 LAB — CBG MONITORING, ED: Glucose-Capillary: 90 mg/dL (ref 70–99)

## 2021-07-02 LAB — ETHANOL: Alcohol, Ethyl (B): 79 mg/dL — ABNORMAL HIGH (ref <10)

## 2021-07-02 MED ORDER — LEVETIRACETAM IN NACL 1000 MG/100ML IV SOLN
1000.0000 mg | Freq: Once | INTRAVENOUS | Status: AC
  Administered 2021-07-02: 1000 mg via INTRAVENOUS
  Filled 2021-07-02: qty 100

## 2021-07-02 NOTE — ED Notes (Signed)
Pt ambulatory to restroom with standby assist

## 2021-07-02 NOTE — ED Triage Notes (Signed)
Pt seems to be postictal, will respond to painfull stimuli.

## 2021-07-02 NOTE — ED Provider Notes (Signed)
[facility] DEPT Provider Note   CSN: [id number] Arrival date & time: 07/02/21  0128     History Chief Complaint  Patient presents with   Altered Mental Status    Pt called 911 from the [place] parking lot saying that she thinks she is going to have a seizure, pt was slumped over the steering wheel when ems arrived.  Pt did tell ems that she had been drinking earlier, was given 80m of versed for fighting medics after coming out of her post ictal state.     Cynitha Denithe DSimonetis a 45y.o. female.  HPI     This is a 45year old female with a history of seizures and polysubstance abuse who presents with altered mental status.  Per EMS report[place]  She was found slumped over her steering wheel and altered.  EMS question whether she may be postictal.  She does have a history of seizures.  She became combative for EMS in route and was given Versed.  Did endorse alcohol use earlier today.  Patient does not contribute to history taking as she is significantly somnolent  Chart reviewed.  [facility] and concern for polysubstance abuse and possible withdrawal.  EKG at that time did not show any seizure-like activity.  She was continued on her Keppra.  Past Medical History:  Diagnosis Date   Anxiety and depression    Diabetes mellitus without complication ([place]    Right flank pain 05/2019   Right upper quadrant pain 8/22020   [place]   Vitamin D deficiency 05/2019    Patient Active Problem List   Diagnosis Date Noted   Cocaine abuse with cocaine-induced mood disorder ([place] 11/27/2020   Alcohol use disorder, moderate, dependence ([place] 11/27/2020   Hypoglycemia 06/27/2019   Hematuria 06/27/2019   Low back pain with sciatica 05/31/2019   Left flank pain 05/31/2019   Intractable headache 05/13/2019   Right upper quadrant pain  05/13/2019   Right flank pain 05/13/2019   Diabetes mellitus type 2 in nonobese ([place] 06/20/2015   Elevated blood alcohol level 06/20/2015   Anxiety and depression 06/20/2015   Seizure disorder ([place] 06/19/2015    Past Surgical History:  Procedure Laterality Date   ABDOMINAL HYSTERECTOMY     TUBAL LIGATION       OB History   No obstetric history on file.     Family History  Problem Relation Age of Onset   Seizures Daughter    Asthma Son    Hypertension Father    Arrhythmia Father        Status post pacemaker insertion   Hypertension Mother    Diabetes Mother     Social History   Tobacco Use   Smoking status: Never   Smokeless tobacco: Never  Vaping Use   Vaping Use: Never used  Substance Use Topics   Alcohol use: Yes    Alcohol/week: 0.0 standard drinks    Comment: Occasional social   Drug use: No    Home Medications Prior to Admission medications   Medication Sig Start Date End Date Taking? Authorizing Provider  albuterol (PROVENTIL) (2.5 MG/3ML) 0.083% nebulizer solution Take 3 mLs (2.5 mg total) by nebulization every 6 (six) hours as needed for wheezing or shortness of breath. 04/04/20  Yes STruddie Hidden MD  albuterol (VENTOLIN HFA) 108 (90 Base) MCG/ACT inhaler Inhale 2 puffs into the lungs  every 4 (four) hours as needed for wheezing or shortness of breath. 02/10/20  Yes Harris, Abigail, PA-C  Budesonide (PULMICORT FLEXHALER) 90 MCG/ACT inhaler Inhale 1 puff into the lungs 2 (two) times daily. Rinse mouth with water and spit after every use. Patient taking differently: Inhale 1 puff into the lungs daily as needed (for shortness of breath). Rinse mouth with water and spit after every use. 10/13/20  Yes Burky, Malachy Moan, NP  levETIRAcetam (KEPPRA) 1000 MG tablet Take 1 tablet (1,000 mg total) by mouth 2 (two) times daily. 02/02/21  Yes Rancour, Annie Main, MD  blood glucose meter kit and supplies Dispense based on patient and insurance preference. Use up to four  times daily as directed. (FOR ICD-10 E10.9, E11.9). 05/13/19   Azzie Glatter, FNP  naproxen (NAPROSYN) 500 MG tablet Take 1 tablet (500 mg total) by mouth 2 (two) times daily. Patient not taking: No sig reported 12/05/20   Henderly, Britni A, PA-C    Allergies    Ibuprofen, Tramadol, and Acetaminophen  Review of Systems   Review of Systems  Unable to perform ROS: Mental status change   Physical Exam Updated Vital Signs BP 105/79   Pulse 70   Temp 98.1 F (36.7 C) (Oral)   Resp 16   Ht 1.753 m (_0 )   Wt 68 kg   SpO2 100%   BMI 22.15 kg/m   Physical Exam Vitals and nursing note reviewed.  Constitutional:      Appearance: Normal appearance. She is well-developed.     Comments: Somnolent, not ill-appearing, arousable to loud voice  HENT:     Head: Normocephalic and atraumatic.     Nose: Nose normal.     Mouth/Throat:     Mouth: Mucous membranes are moist.  Eyes:     Pupils: Pupils are equal, round, and reactive to light.  Cardiovascular:     Rate and Rhythm: Normal rate and regular rhythm.     Heart sounds: Normal heart sounds.  Pulmonary:     Effort: Pulmonary effort is normal. No respiratory distress.     Breath sounds: No wheezing.  Abdominal:     Palpations: Abdomen is soft.     Tenderness: There is no abdominal tenderness.  Musculoskeletal:     Cervical back: Neck supple.  Skin:    General: Skin is warm and dry.  Neurological:     Comments: Unable to fully evaluate, patient significantly somnolent, appears to move all 4 extremities  Psychiatric:     Comments: Unable to assess    ED Results / Procedures / Treatments   Labs (all labs ordered are listed, but only abnormal results are displayed) Labs Reviewed  CBC WITH DIFFERENTIAL/PLATELET - Abnormal; Notable for the following components:      Result Value   RBC 3.80 (*)    Hemoglobin 11.8 (*)    All other components within normal limits  COMPREHENSIVE METABOLIC PANEL - Abnormal; Notable for the  following components:   Potassium 3.4 (*)    Total Protein 8.6 (*)    All other components within normal limits  URINALYSIS, ROUTINE W REFLEX MICROSCOPIC - Abnormal; Notable for the following components:   Color, Urine STRAW (*)    Specific Gravity, Urine 1.002 (*)    Hgb urine dipstick SMALL (*)    All other components within normal limits  RAPID URINE DRUG SCREEN, HOSP PERFORMED - Abnormal; Notable for the following components:   Cocaine POSITIVE (*)    Benzodiazepines POSITIVE (*)  Barbiturates POSITIVE (*)    All other components within normal limits  ETHANOL - Abnormal; Notable for the following components:   Alcohol, Ethyl (B) 79 (*)    All other components within normal limits  CBG MONITORING, ED    EKG EKG Interpretation  Date/Time:  Saturday July 02 2021 01:38:17 EDT Ventricular Rate:  87 PR Interval:  198 QRS Duration: 81 QT Interval:  439 QTC Calculation: 529 R Axis:   65 Text Interpretation: Sinus rhythm Probable left atrial enlargement Probable left ventricular hypertrophy Borderline T abnormalities, anterior leads Prolonged QT interval prolonged QT new when compared to prior Confirmed by Thayer Jew [phone]) on 07/02/2021 1:59:18 AM  Radiology No results found.  Procedures Procedures   Medications Ordered in ED Medications  levETIRAcetam (KEPPRA) IVPB 1000 mg/100 mL premix (0 mg Intravenous Stopped 07/02/21 0309)    ED Course  I have reviewed the triage vital signs and the nursing notes.  Pertinent labs & imaging results that were available during my care of the patient were reviewed by me and considered in my medical decision making (see chart for details).  Clinical Course as of 07/02/21 0017  Sat Jul 02, 2021  4944 Patient more awake and alert.  Endorses alcohol use earlier this evening.  States compliance with Keppra.  Could not provide full details of the evening but states that she was out with her friends prior to calling 911. [CH]     Clinical Course User Index [CH] Pema Thomure, Barbette Hair, MD   MDM Rules/Calculators/A&P                           Patient presents with altered mental status.  History of polysubstance abuse and seizures.  She is nontoxic and vital signs are reassuring.  ABCs are intact but she is minimally responsive.  Could be related to being administered Versed in route.  Per EMS, she was combative.  Considerations include but not limited to acute intoxication, postictal state from seizure, less likely traumatic injury or infectious etiology.  Labs obtained.  Labs are largely reassuring.  Alcohol level is 79.  Drug screen is positive for cocaine, benzodiazepines, barbiturates.  On recheck, patient is more awake and alert.  She is able to ambulate and tolerate fluids on her own.  Recommend she continue her Keppra at home as previously prescribed.  She was advised that if she wishes to abstain from alcohol, she should only do so under the supervision of the doctor.  After history, exam, and medical workup I feel the patient has been appropriately medically screened and is safe for discharge home. Pertinent diagnoses were discussed with the patient. Patient was given return precautions.  Final Clinical Impression(s) / ED Diagnoses Final diagnoses:  Somnolence  Polysubstance abuse ([place])  History of seizure    Rx / DC Orders ED Discharge Orders     None        Daunte Oestreich, Barbette Hair, MD 07/02/21 [phone]

## 2021-07-02 NOTE — Discharge Instructions (Addendum)
You were seen today for altered mental status.  This could have been related to your substance abuse and alcohol abuse this evening.  It is also consideration that you may have had a seizure.  You need to continue your seizure medications at home.  Drugs and alcohol can lower your seizure threshold.  If you attempt to stop drinking alcohol, you should only do so under the care of a doctor.

## 2021-08-03 ENCOUNTER — Other Ambulatory Visit: Payer: Self-pay | Admitting: Family Medicine

## 2021-08-03 DIAGNOSIS — Z1231 Encounter for screening mammogram for malignant neoplasm of breast: Secondary | ICD-10-CM

## 2021-08-20 ENCOUNTER — Emergency Department (HOSPITAL_COMMUNITY)
Admission: EM | Admit: 2021-08-20 | Discharge: 2021-08-20 | Disposition: A | Discharge status: Home / Self Care | Payer: Medicaid Other | Attending: Emergency Medicine | Admitting: Emergency Medicine

## 2021-08-20 ENCOUNTER — Encounter (HOSPITAL_COMMUNITY): Payer: Self-pay | Admitting: *Deleted

## 2021-08-20 ENCOUNTER — Other Ambulatory Visit: Payer: Self-pay

## 2021-08-20 ENCOUNTER — Emergency Department (HOSPITAL_COMMUNITY): Payer: Medicaid Other

## 2021-08-20 DIAGNOSIS — Y905 Blood alcohol level of 100-119 mg/100 ml: Secondary | ICD-10-CM | POA: Diagnosis not present

## 2021-08-20 DIAGNOSIS — R569 Unspecified convulsions: Secondary | ICD-10-CM | POA: Diagnosis present

## 2021-08-20 DIAGNOSIS — E119 Type 2 diabetes mellitus without complications: Secondary | ICD-10-CM | POA: Insufficient documentation

## 2021-08-20 LAB — CBC WITH DIFFERENTIAL/PLATELET
Abs Immature Granulocytes: 0.01 10*3/uL (ref 0.00–0.07)
Basophils Absolute: 0 10*3/uL (ref 0.0–0.1)
Basophils Relative: 1 %
Eosinophils Absolute: 0 10*3/uL (ref 0.0–0.5)
Eosinophils Relative: 1 %
HCT: 37.8 % (ref 36.0–46.0)
Hemoglobin: 12.4 g/dL (ref 12.0–15.0)
Immature Granulocytes: 0 %
Lymphocytes Relative: 39 %
Lymphs Abs: 1.8 10*3/uL (ref 0.7–4.0)
MCH: 30.6 pg (ref 26.0–34.0)
MCHC: 32.8 g/dL (ref 30.0–36.0)
MCV: 93.3 fL (ref 80.0–100.0)
Monocytes Absolute: 0.2 10*3/uL (ref 0.1–1.0)
Monocytes Relative: 5 %
Neutro Abs: 2.5 10*3/uL (ref 1.7–7.7)
Neutrophils Relative %: 54 %
Platelets: 203 10*3/uL (ref 150–400)
RBC: 4.05 MIL/uL (ref 3.87–5.11)
RDW: 12.8 % (ref 11.5–15.5)
WBC: 4.6 10*3/uL (ref 4.0–10.5)
nRBC: 0 % (ref 0.0–0.2)

## 2021-08-20 LAB — COMPREHENSIVE METABOLIC PANEL
ALT: 19 U/L (ref 0–44)
AST: 25 U/L (ref 15–41)
Albumin: 4.7 g/dL (ref 3.5–5.0)
Alkaline Phosphatase: 64 U/L (ref 38–126)
Anion gap: 11 (ref 5–15)
BUN: 12 mg/dL (ref 6–20)
CO2: 20 mmol/L — ABNORMAL LOW (ref 22–32)
Calcium: 9 mg/dL (ref 8.9–10.3)
Chloride: 105 mmol/L (ref 98–111)
Creatinine, Ser: 0.9 mg/dL (ref 0.44–1.00)
GFR, Estimated: >60 mL/min (ref >60)
Glucose, Bld: 87 mg/dL (ref 70–99)
Potassium: 3.5 mmol/L (ref 3.5–5.1)
Sodium: 136 mmol/L (ref 135–145)
Total Bilirubin: 0.6 mg/dL (ref 0.3–1.2)
Total Protein: 8.7 g/dL — ABNORMAL HIGH (ref 6.5–8.1)

## 2021-08-20 LAB — ETHANOL: Alcohol, Ethyl (B): 101 mg/dL — ABNORMAL HIGH (ref <10)

## 2021-08-20 MED ORDER — LEVETIRACETAM IN NACL 1000 MG/100ML IV SOLN
1000.0000 mg | Freq: Once | INTRAVENOUS | Status: AC
  Administered 2021-08-20: 1000 mg via INTRAVENOUS
  Filled 2021-08-20: qty 100

## 2021-08-20 NOTE — ED Notes (Signed)
Gave patient ginger ale and graham crackers

## 2021-08-20 NOTE — Discharge Instructions (Signed)
You are seen today for concern for seizure.  It is very important that you take your seizure medication as prescribed.  Follow-up with your neurologist.

## 2021-08-20 NOTE — ED Triage Notes (Signed)
EMS called out to pt's residence for seizures. Upon arrival pt was found lying in the bathroom not responding appropriately or following commands. Pt with questionable seizure activity "twitching motions". Eventually pt became more responsive and was ambulating in the house however to stop periodically and appear drowsy. She still wasn't following commands or answering questions. EMS states that there was a empty wine glass and that once loaded into the ambulance pt did say that she had been drinking wine this evening. Pt received 5mg  IM versed in the field.

## 2021-08-20 NOTE — ED Provider Notes (Signed)
[facility] DEPT Provider Note   CSN: [id number] Arrival date & time: 08/20/21  0053     History Chief Complaint  Patient presents with   Seizures    Jennifer Crawford is a 45 y.o. female.  HPI    This a 45 year old female with a history of diabetes, seizures, alcohol and drug abuse who presents with concern for seizure.  Per EMS, they were called out for seizures.  Upon arrival they noted the patient was lying on the bathroom floor and not responding appropriately.  They noted some twitching motion.  Patient was given Versed.  She ultimately became more responsive and was able to ambulate but appeared drowsy.  She did endorse drinking some alcohol.  Per EMS, vital signs were stable in route.  On my evaluation, patient initially was very difficult to arouse.  She had a fixed stare midline and would not answer questions or respond to pain.  She was unable to contribute to history taking.  Level 5 caveat for altered mental status Past Medical History:  Diagnosis Date   Anxiety and depression    Diabetes mellitus without complication ([place])    Right flank pain 05/2019   Right upper quadrant pain 8/22020   Seizures ([place]) 2015   Vitamin D deficiency 05/2019    Patient Active Problem List   Diagnosis Date Noted   Cocaine abuse with cocaine-induced mood disorder ([place]) 11/27/2020   Alcohol use disorder, moderate, dependence ([place]) 11/27/2020   Hypoglycemia 06/27/2019   Hematuria 06/27/2019   Low back pain with sciatica 05/31/2019   Left flank pain 05/31/2019   Intractable headache 05/13/2019   Right upper quadrant pain 05/13/2019   Right flank pain 05/13/2019   Diabetes mellitus type 2 in nonobese ([place]) 06/20/2015   Elevated blood alcohol level 06/20/2015   Anxiety and depression 06/20/2015   Seizure disorder ([place]) 06/19/2015    Past Surgical History:  Procedure Laterality Date   ABDOMINAL HYSTERECTOMY     TUBAL LIGATION       OB  History   No obstetric history on file.     Family History  Problem Relation Age of Onset   Seizures Daughter    Asthma Son    Hypertension Father    Arrhythmia Father        Status post pacemaker insertion   Hypertension Mother    Diabetes Mother     Social History   Tobacco Use   Smoking status: Never   Smokeless tobacco: Never  Vaping Use   Vaping Use: Never used  Substance Use Topics   Alcohol use: Yes    Alcohol/week: 0.0 standard drinks    Comment: Occasional social   Drug use: No    Home Medications Prior to Admission medications   Medication Sig Start Date End Date Taking? Authorizing Provider  albuterol (PROVENTIL) (2.5 MG/3ML) 0.083% nebulizer solution Take 3 mLs (2.5 mg total) by nebulization every 6 (six) hours as needed for wheezing or shortness of breath. 04/04/20   Truddie Hidden, MD  albuterol (VENTOLIN HFA) 108 (90 Base) MCG/ACT inhaler Inhale 2 puffs into the lungs every 4 (four) hours as needed for wheezing or shortness of breath. 02/10/20   Margarita Mail, PA-C  blood glucose meter kit and supplies Dispense based on patient and insurance preference. Use up to four times daily as directed. (FOR ICD-10 E10.9, E11.9). 05/13/19   Azzie Glatter, FNP  Budesonide (PULMICORT FLEXHALER) 90 MCG/ACT inhaler Inhale 1 puff into the lungs  2 (two) times daily. Rinse mouth with water and spit after every use. Patient taking differently: Inhale 1 puff into the lungs daily as needed (for shortness of breath). Rinse mouth with water and spit after every use. 10/13/20   Burky, Malachy Moan, NP  levETIRAcetam (KEPPRA) 1000 MG tablet Take 1 tablet (1,000 mg total) by mouth 2 (two) times daily. 02/02/21   Rancour, Annie Main, MD  naproxen (NAPROSYN) 500 MG tablet Take 1 tablet (500 mg total) by mouth 2 (two) times daily. Patient not taking: No sig reported 12/05/20   Henderly, Britni A, PA-C    Allergies    Ibuprofen, Tramadol, and Acetaminophen  Review of Systems   Review of  Systems  Unable to perform ROS: Mental status change   Physical Exam Updated Vital Signs BP 93/65   Pulse 64   Temp 97.6 F (36.4 C) (Axillary)   Resp 17   SpO2 100%   Physical Exam Vitals and nursing note reviewed.  Constitutional:      Appearance: She is well-developed.     Comments: Somnolent, minimally arousable, ultimately will withdrawal to tactile stimuli  HENT:     Head: Normocephalic and atraumatic.     Nose: Nose normal.     Mouth/Throat:     Mouth: Mucous membranes are moist.  Eyes:     Pupils: Pupils are equal, round, and reactive to light.     Comments: Initially 9 mm reactive bilaterally  Cardiovascular:     Rate and Rhythm: Normal rate and regular rhythm.     Heart sounds: Normal heart sounds.  Pulmonary:     Effort: Pulmonary effort is normal. No respiratory distress.     Breath sounds: No wheezing.  Abdominal:     Palpations: Abdomen is soft.     Tenderness: There is no abdominal tenderness.  Musculoskeletal:     Cervical back: Neck supple.  Skin:    General: Skin is warm and dry.  Neurological:     Comments: Somnolent, minimally arousable, question absence seizure on initial evaluation although ultimately appear postictal, moves all 4 extremities but not following commands reliably  Psychiatric:     Comments: Unable to assess    ED Results / Procedures / Treatments   Labs (all labs ordered are listed, but only abnormal results are displayed) Labs Reviewed  COMPREHENSIVE METABOLIC PANEL - Abnormal; Notable for the following components:      Result Value   CO2 20 (*)    Total Protein 8.7 (*)    All other components within normal limits  ETHANOL - Abnormal; Notable for the following components:   Alcohol, Ethyl (B) 101 (*)    All other components within normal limits  CBC WITH DIFFERENTIAL/PLATELET    EKG None  Radiology CT Head Wo Contrast  Result Date: 08/20/2021 CLINICAL DATA:  Head trauma, seizure EXAM: CT HEAD WITHOUT CONTRAST  TECHNIQUE: Contiguous axial images were obtained from the base of the skull through the vertex without intravenous contrast. COMPARISON:  11/04/2020 FINDINGS: Brain: There is no mass, hemorrhage or extra-axial collection. The size and configuration of the ventricles and extra-axial CSF spaces are normal. The brain parenchyma is normal, without acute or chronic infarction. Vascular: No abnormal hyperdensity of the major intracranial arteries or dural venous sinuses. No intracranial atherosclerosis. Skull: The visualized skull base, calvarium and extracranial soft tissues are normal. Sinuses/Orbits: No fluid levels or advanced mucosal thickening of the visualized paranasal sinuses. No mastoid or middle ear effusion. The orbits are normal. IMPRESSION: Normal  head CT. Electronically Signed   By: Ulyses Jarred M.D.   On: 08/20/2021 02:42    Procedures .Critical Care Performed by: Merryl Hacker, MD Authorized by: Merryl Hacker, MD   Critical care provider statement:    Critical care time (minutes):  31   Critical care was time spent personally by me on the following activities:  Development of treatment plan with patient or surrogate, evaluation of patient's response to treatment, examination of patient, ordering and review of laboratory studies, ordering and review of radiographic studies, ordering and performing treatments and interventions, pulse oximetry, re-evaluation of patient's condition and review of old charts   Medications Ordered in ED Medications  levETIRAcetam (KEPPRA) IVPB 1000 mg/100 mL premix (0 mg Intravenous Stopped 08/20/21 0159)    ED Course  I have reviewed the triage vital signs and the nursing notes.  Pertinent labs & imaging results that were available during my care of the patient were reviewed by me and considered in my medical decision making (see chart for details).  Clinical Course as of 08/21/21 0545  Sat Aug 20, 2021  0358 Somnolent but arousable.  Continues  to sleep.  Blood alcohol level 101. [CH]  [id number] Patient now more arousable.  Work-up reassuring.  Neurologic exam intact.  She reports that she has missed a few doses of Keppra.  Have asked nursing to ambulate her in the hall. [CH]    Clinical Course User Index [CH] Delvon Chipps, Barbette Hair, MD   MDM Rules/Calculators/A&P                           Patient presents with probable seizure-like activity.  History of the same.  It appears she may have been having a seizure on my evaluation but was most definitely postictal.  She was loaded with a gram of Keppra.  Supposed be on Keppra at home.  Labs obtained and no significant metabolic derangements.  CT head without any anterior cranial pathology.  Patient progressively became more arousable.  She endorses that she is not taking her Keppra regularly and has missed several doses.  I encouraged her to take her Keppra as prescribed.  Patient was able to ambulate and tolerate fluids without difficulty.  Blood alcohol level was noted to be 101.  Patient did endorse some alcohol use tonight.  This is likely contributory as well.  After history, exam, and medical workup I feel the patient has been appropriately medically screened and is safe for discharge home. Pertinent diagnoses were discussed with the patient. Patient was given return precautions.  Final Clinical Impression(s) / ED Diagnoses Final diagnoses:  Seizure [facility])    Rx / [place] Orders ED Discharge Orders     None        Rosalin Buster, Barbette Hair, MD 08/21/21 [phone]

## 2021-09-13 ENCOUNTER — Emergency Department: Payer: Medicaid Other

## 2021-09-13 ENCOUNTER — Emergency Department
Admission: EM | Admit: 2021-09-13 | Discharge: 2021-09-14 | Disposition: A | Discharge status: Home / Self Care | Payer: Medicaid Other | Attending: Emergency Medicine | Admitting: Emergency Medicine

## 2021-09-13 DIAGNOSIS — S80812A Abrasion, left lower leg, initial encounter: Secondary | ICD-10-CM | POA: Insufficient documentation

## 2021-09-13 DIAGNOSIS — M542 Cervicalgia: Secondary | ICD-10-CM | POA: Diagnosis not present

## 2021-09-13 DIAGNOSIS — M545 Low back pain, unspecified: Secondary | ICD-10-CM | POA: Insufficient documentation

## 2021-09-13 DIAGNOSIS — Z23 Encounter for immunization: Secondary | ICD-10-CM | POA: Insufficient documentation

## 2021-09-13 DIAGNOSIS — E119 Type 2 diabetes mellitus without complications: Secondary | ICD-10-CM | POA: Diagnosis not present

## 2021-09-13 DIAGNOSIS — S8990XA Unspecified injury of unspecified lower leg, initial encounter: Secondary | ICD-10-CM | POA: Diagnosis present

## 2021-09-13 DIAGNOSIS — M25571 Pain in right ankle and joints of right foot: Secondary | ICD-10-CM | POA: Diagnosis not present

## 2021-09-13 DIAGNOSIS — M25561 Pain in right knee: Secondary | ICD-10-CM | POA: Insufficient documentation

## 2021-09-13 DIAGNOSIS — Y92410 Unspecified street and highway as the place of occurrence of the external cause: Secondary | ICD-10-CM | POA: Diagnosis not present

## 2021-09-13 DIAGNOSIS — S0990XA Unspecified injury of head, initial encounter: Secondary | ICD-10-CM

## 2021-09-13 DIAGNOSIS — M25569 Pain in unspecified knee: Secondary | ICD-10-CM

## 2021-09-13 MED ORDER — LIDOCAINE 5 % EX PTCH
1.0000 | MEDICATED_PATCH | CUTANEOUS | Status: DC
Start: 2021-09-13 — End: 2021-09-14
  Administered 2021-09-14: 1 via TRANSDERMAL
  Filled 2021-09-13: qty 1

## 2021-09-13 MED ORDER — TETANUS-DIPHTH-ACELL PERTUSSIS 5-2.5-18.5 LF-MCG/0.5 IM SUSY
0.5000 mL | PREFILLED_SYRINGE | Freq: Once | INTRAMUSCULAR | Status: AC
  Administered 2021-09-14: 0.5 mL via INTRAMUSCULAR
  Filled 2021-09-13: qty 0.5

## 2021-09-13 MED ORDER — OXYCODONE HCL 5 MG PO TABS
5.0000 mg | ORAL_TABLET | Freq: Once | ORAL | Status: AC
  Administered 2021-09-13: 5 mg via ORAL
  Filled 2021-09-13: qty 1

## 2021-09-13 MED ORDER — OXYCODONE HCL 5 MG PO TABS
5.0000 mg | ORAL_TABLET | Freq: Once | ORAL | Status: AC
  Administered 2021-09-14: 5 mg via ORAL
  Filled 2021-09-13: qty 1

## 2021-09-13 MED ORDER — ONDANSETRON 4 MG PO TBDP
4.0000 mg | ORAL_TABLET | Freq: Once | ORAL | Status: AC
  Administered 2021-09-13: 4 mg via ORAL
  Filled 2021-09-13: qty 1
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
## 2021-09-14 MED ORDER — LIDOCAINE 5 % EX PTCH: 1.0000 | MEDICATED_PATCH | Freq: Two times a day (BID) | CUTANEOUS | 0 refills | Status: AC
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
## 2021-09-14 NOTE — Discharge Instructions (Addendum)
CT and x-rays were reassuring.  Return to the ER for abdominal pain, fevers or any other concerns  IMPRESSION: Degenerative changes without evidence of fractures. Chronic sacroiliitis.

## 2021-09-21 ENCOUNTER — Ambulatory Visit: Payer: Medicaid Other

## 2021-09-23 ENCOUNTER — Encounter (HOSPITAL_BASED_OUTPATIENT_CLINIC_OR_DEPARTMENT_OTHER): Payer: Self-pay | Admitting: Emergency Medicine

## 2021-09-23 ENCOUNTER — Emergency Department (HOSPITAL_BASED_OUTPATIENT_CLINIC_OR_DEPARTMENT_OTHER)
Admission: EM | Admit: 2021-09-23 | Discharge: 2021-09-23 | Disposition: A | Discharge status: Home / Self Care | Payer: Medicaid Other | Attending: Emergency Medicine | Admitting: Emergency Medicine

## 2021-09-23 ENCOUNTER — Other Ambulatory Visit: Payer: Self-pay

## 2021-09-23 DIAGNOSIS — S199XXA Unspecified injury of neck, initial encounter: Secondary | ICD-10-CM | POA: Diagnosis present

## 2021-09-23 DIAGNOSIS — Y92410 Unspecified street and highway as the place of occurrence of the external cause: Secondary | ICD-10-CM | POA: Diagnosis not present

## 2021-09-23 DIAGNOSIS — S161XXA Strain of muscle, fascia and tendon at neck level, initial encounter: Secondary | ICD-10-CM | POA: Insufficient documentation

## 2021-09-23 DIAGNOSIS — E119 Type 2 diabetes mellitus without complications: Secondary | ICD-10-CM | POA: Diagnosis not present

## 2021-09-23 MED ORDER — LIDOCAINE 5 % EX PTCH
1.0000 | MEDICATED_PATCH | CUTANEOUS | Status: DC
  Administered 2021-09-23: 03:00:00 1 via TRANSDERMAL
  Filled 2021-09-23: qty 1

## 2021-09-23 MED ORDER — LIDOCAINE 5 % EX PTCH: 1.0000 | MEDICATED_PATCH | CUTANEOUS | 0 refills | Status: DC
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
## 2021-11-06 ENCOUNTER — Encounter (HOSPITAL_COMMUNITY): Payer: Self-pay

## 2021-11-06 ENCOUNTER — Other Ambulatory Visit: Payer: Self-pay

## 2021-11-06 ENCOUNTER — Emergency Department (HOSPITAL_COMMUNITY)
Admission: EM | Admit: 2021-11-06 | Discharge: 2021-11-06 | Disposition: A | Discharge status: Home / Self Care | Payer: Medicaid Other | Attending: Emergency Medicine | Admitting: Emergency Medicine

## 2021-11-06 DIAGNOSIS — G40909 Epilepsy, unspecified, not intractable, without status epilepticus: Secondary | ICD-10-CM | POA: Insufficient documentation

## 2021-11-06 DIAGNOSIS — R569 Unspecified convulsions: Secondary | ICD-10-CM | POA: Diagnosis present

## 2021-11-06 DIAGNOSIS — E119 Type 2 diabetes mellitus without complications: Secondary | ICD-10-CM | POA: Insufficient documentation

## 2021-11-06 LAB — CBC WITH DIFFERENTIAL/PLATELET
Abs Immature Granulocytes: 0.01 10*3/uL (ref 0.00–0.07)
Basophils Absolute: 0 10*3/uL (ref 0.0–0.1)
Basophils Relative: 1 %
Eosinophils Absolute: 0.1 10*3/uL (ref 0.0–0.5)
Eosinophils Relative: 1 %
HCT: 38.1 % (ref 36.0–46.0)
Hemoglobin: 12.4 g/dL (ref 12.0–15.0)
Immature Granulocytes: 0 %
Lymphocytes Relative: 43 %
Lymphs Abs: 1.7 10*3/uL (ref 0.7–4.0)
MCH: 30.8 pg (ref 26.0–34.0)
MCHC: 32.5 g/dL (ref 30.0–36.0)
MCV: 94.8 fL (ref 80.0–100.0)
Monocytes Absolute: 0.3 10*3/uL (ref 0.1–1.0)
Monocytes Relative: 7 %
Neutro Abs: 1.9 10*3/uL (ref 1.7–7.7)
Neutrophils Relative %: 48 %
Platelets: 187 10*3/uL (ref 150–400)
RBC: 4.02 MIL/uL (ref 3.87–5.11)
RDW: 13 % (ref 11.5–15.5)
WBC: 3.9 10*3/uL — ABNORMAL LOW (ref 4.0–10.5)
nRBC: 0 % (ref 0.0–0.2)

## 2021-11-06 LAB — COMPREHENSIVE METABOLIC PANEL
ALT: 22 U/L (ref 0–44)
AST: 25 U/L (ref 15–41)
Albumin: 4.6 g/dL (ref 3.5–5.0)
Alkaline Phosphatase: 56 U/L (ref 38–126)
Anion gap: 10 (ref 5–15)
BUN: 13 mg/dL (ref 6–20)
CO2: 21 mmol/L — ABNORMAL LOW (ref 22–32)
Calcium: 9.2 mg/dL (ref 8.9–10.3)
Chloride: 104 mmol/L (ref 98–111)
Creatinine, Ser: 0.83 mg/dL (ref 0.44–1.00)
GFR, Estimated: >60 mL/min (ref >60)
Glucose, Bld: 92 mg/dL (ref 70–99)
Potassium: 3.4 mmol/L — ABNORMAL LOW (ref 3.5–5.1)
Sodium: 135 mmol/L (ref 135–145)
Total Bilirubin: 0.4 mg/dL (ref 0.3–1.2)
Total Protein: 8.3 g/dL — ABNORMAL HIGH (ref 6.5–8.1)

## 2021-11-06 LAB — RAPID URINE DRUG SCREEN, HOSP PERFORMED
Amphetamines: NOT DETECTED
Barbiturates: NOT DETECTED
Benzodiazepines: POSITIVE — AB
Cocaine: POSITIVE — AB
Opiates: NOT DETECTED
Tetrahydrocannabinol: NOT DETECTED

## 2021-11-06 LAB — ETHANOL: Alcohol, Ethyl (B): 86 mg/dL — ABNORMAL HIGH (ref <10)

## 2021-11-06 MED ORDER — LEVETIRACETAM IN NACL 1000 MG/100ML IV SOLN
1000.0000 mg | Freq: Once | INTRAVENOUS | Status: AC
  Administered 2021-11-06: 1000 mg via INTRAVENOUS
  Filled 2021-11-06: qty 100

## 2021-11-06 MED ORDER — LEVETIRACETAM 1000 MG PO TABS: 1000.0000 mg | ORAL_TABLET | Freq: Two times a day (BID) | ORAL | 0 refills | Status: DC

## 2021-11-06 NOTE — Discharge Instructions (Signed)
Resume your keppra as prescribed, try not to miss any doses. Follow-up with your primary care doctor and/or your neurologist.   Return to the ED for new or worsening symptoms.

## 2021-11-06 NOTE — ED Provider Notes (Signed)
Patient's care assumed from Sharilyn Sites, PA-C at shift change.  Please see their note for further information.  Briefly: Patient with history of alcohol and cocaine abuse with history of seizures presents today after witnessed seizure.  Normal seizure for her has not been taking her home Keppra.  Was given Versed by EMS.  Plan: Patient given load of Keppra in ED. Somewhat difficult to arouse on exam likely attributed to getting Versed with EMS.  Work-up reassuring for acute abnormalities.  Plan to discharge once she is fully awake and alert and to send prescription for home dose of Keppra with PCP and neurology follow-up.  8:37 AM Patient is awake, alert and oriented x3 and ambulatory with steady gait.  She has no complaints.  She is afebrile, nontoxic-appearing, and in no acute distress with reassuring vital signs.  Stable for discharge at this time.  She has been educated on the plan and importance of taking her home medications as well as red flag symptoms of prompt immediate return.  Patient discharged in stable condition.   Silva Bandy, PA-C 11/06/21 8416    Rolan Bucco, MD 11/06/21 [phone]

## 2021-11-06 NOTE — ED Provider Notes (Signed)
[facility] DEPT Provider Note   CSN: [id number] Arrival date & time: 11/06/21  0320     History  Chief Complaint  Patient presents with   Seizures    Jennifer Crawford is a 46 y.o. female.  The history is provided by the patient and medical records.   46 year old female with history of alcohol abuse, cocaine abuse, diabetes, seizure disorder, presenting to the ED after witnessed seizure.  This occurred at home, witnessed by family.  Normal tonic-clonic.  She usually takes Keppra for seizures but apparently ran out yesterday per family.  Was postictal upon EMS arrival and started coming around in route, she sat up and became combative so was given 5 mg IM Versed.  She is sleeping on my assessment.  Home Medications Prior to Admission medications   Medication Sig Start Date End Date Taking? Authorizing Provider  albuterol (PROVENTIL) (2.5 MG/3ML) 0.083% nebulizer solution Take 3 mLs (2.5 mg total) by nebulization every 6 (six) hours as needed for wheezing or shortness of breath. 04/04/20   Truddie Hidden, MD  albuterol (VENTOLIN HFA) 108 (90 Base) MCG/ACT inhaler Inhale 2 puffs into the lungs every 4 (four) hours as needed for wheezing or shortness of breath. 02/10/20   Margarita Mail, PA-C  blood glucose meter kit and supplies Dispense based on patient and insurance preference. Use up to four times daily as directed. (FOR ICD-10 E10.9, E11.9). 05/13/19   Azzie Glatter, FNP  Budesonide (PULMICORT FLEXHALER) 90 MCG/ACT inhaler Inhale 1 puff into the lungs 2 (two) times daily. Rinse mouth with water and spit after every use. Patient taking differently: Inhale 1 puff into the lungs daily as needed (for shortness of breath). Rinse mouth with water and spit after every use. 10/13/20   Burky, Malachy Moan, NP  levETIRAcetam (KEPPRA) 1000 MG tablet Take 1 tablet (1,000 mg total) by mouth 2 (two) times daily. 02/02/21   Rancour, Annie Main, MD  lidocaine (LIDODERM)  5 % Place 1 patch onto the skin daily. Remove & Discard patch within 12 hours or as directed by MD 09/23/21   Malvin Johns, MD  naproxen (NAPROSYN) 500 MG tablet Take 1 tablet (500 mg total) by mouth 2 (two) times daily. Patient not taking: No sig reported 12/05/20   Henderly, Britni A, PA-C      Allergies    Ibuprofen, Tramadol, and Acetaminophen    Review of Systems   Review of Systems  Neurological:  Positive for seizures.  All other systems reviewed and are negative.  Physical Exam Updated Vital Signs BP (!) 135/97    Pulse 66    Temp 97.9 F (36.6 C) (Oral)    Resp 15    Ht 5' 9"  (1.753 m)    Wt 74.8 kg    SpO2 100%    BMI 24.37 kg/m   Physical Exam Vitals and nursing note reviewed.  Constitutional:      Appearance: She is well-developed.     Comments: Sleeping, post versed by EMS  HENT:     Head: Normocephalic and atraumatic.     Comments: No visible head trauma    Mouth/Throat:     Comments: No tongue or dental injury noted Eyes:     Conjunctiva/sclera: Conjunctivae normal.     Pupils: Pupils are equal, round, and reactive to light.     Comments: PERRL  Cardiovascular:     Rate and Rhythm: Normal rate and regular rhythm.     Heart sounds: Normal heart  sounds.  Pulmonary:     Effort: Pulmonary effort is normal. No respiratory distress.     Breath sounds: Normal breath sounds. No rhonchi.  Abdominal:     General: Bowel sounds are normal.     Palpations: Abdomen is soft.  Musculoskeletal:        General: Normal range of motion.     Cervical back: Normal range of motion.  Skin:    General: Skin is warm and dry.  Neurological:     Comments: Sleeping, spontaneously moving in bed    ED Results / Procedures / Treatments   Labs (all labs ordered are listed, but only abnormal results are displayed) Labs Reviewed  CBC WITH DIFFERENTIAL/PLATELET - Abnormal; Notable for the following components:      Result Value   WBC 3.9 (*)    All other components within normal  limits  COMPREHENSIVE METABOLIC PANEL - Abnormal; Notable for the following components:   Potassium 3.4 (*)    CO2 21 (*)    Total Protein 8.3 (*)    All other components within normal limits  ETHANOL - Abnormal; Notable for the following components:   Alcohol, Ethyl (B) 86 (*)    All other components within normal limits  RAPID URINE DRUG SCREEN, HOSP PERFORMED    EKG EKG Interpretation  Date/Time:  Sunday November 06 2021 03:34:40 EST Ventricular Rate:  79 PR Interval:  189 QRS Duration: 91 QT Interval:  380 QTC Calculation: 436 R Axis:   68 Text Interpretation: Sinus rhythm Consider left ventricular hypertrophy Borderline T abnormalities, diffuse leads No acute changes Confirmed by Addison Lank ([id number]) on 11/06/2021 4:31:08 AM  Radiology No results found.  Procedures Procedures    Medications Ordered in ED Medications  levETIRAcetam (KEPPRA) IVPB 1000 mg/100 mL premix (0 mg Intravenous Stopped 11/06/21 0421)    ED Course/ Medical Decision Making/ A&P                           Medical Decision Making Amount and/or Complexity of Data Reviewed Independent Historian: EMS External Data Reviewed: labs, radiology and ECG. Labs: ordered. ECG/medicine tests: ordered and independent interpretation performed.  Risk Prescription drug management.   46 year old female presenting to the ED after seizure.  Has known history of same, ran out of her Keppra yesterday.  Became somewhat combative with EMS in route so given 5 mg of IM Versed.  She is sleeping on my initial assessment.  She has no head or facial trauma visible on exam.  Her vitals are stable on room air.  Will load with IV Keppra, obtain EKG and labs.  EKG is nonischemic.  Labs are overall reassuring.  Ethanol is elevated at 86. IV keppra infusing.  Will monitor.  5:23 AM Patient still somewhat sleepy but does arouse and converse on repeat assessment.  States she just feels tired.  She is moving her extremities well,  no recurrent seizure activity, no focal deficits.  Denies headache, confusion, numbness, weakness.  Will monitor until fully awake/alert.  6:23 AM Patient still drowsy/sleepy, no further seizure activity.  She continues to arouse to voice when spoken to.  VSS.  Anticipate discharge once fully awake/alert and ambulatory.  New prescription for keppra sent to pharmacy, advised to adhere to regimen.  Recommended close PCP and/or neurology follow-up.  Care will be signed out to oncoming provider.  Final Clinical Impression(s) / ED Diagnoses Final diagnoses:  Seizure ([place])    Rx /  DC Orders ED Discharge Orders     None         Kathryne Hitch 11/06/21 6283    Fatima Blank, MD 11/06/21 [phone]

## 2021-11-06 NOTE — ED Triage Notes (Signed)
Family woke up to patient having a seizure called 911, ems found her drowsy and loaded her into the ambulance, witnessed a 15 to 20 second tonic clonic seizure, and patient became combative after her post ictal state, ems gave 5 of versed. Pt ran out of keppra a day ago according to family.

## 2021-11-06 NOTE — ED Notes (Signed)
Patient awake, A&Ox4

## 2021-12-05 ENCOUNTER — Emergency Department (HOSPITAL_COMMUNITY)
Admission: EM | Admit: 2021-12-05 | Discharge: 2021-12-05 | Disposition: A | Discharge status: Home / Self Care | Payer: No Typology Code available for payment source | Attending: Emergency Medicine | Admitting: Emergency Medicine

## 2021-12-05 ENCOUNTER — Emergency Department (HOSPITAL_COMMUNITY): Payer: No Typology Code available for payment source

## 2021-12-05 DIAGNOSIS — F1010 Alcohol abuse, uncomplicated: Secondary | ICD-10-CM | POA: Diagnosis present

## 2021-12-05 DIAGNOSIS — E119 Type 2 diabetes mellitus without complications: Secondary | ICD-10-CM | POA: Insufficient documentation

## 2021-12-05 DIAGNOSIS — F1410 Cocaine abuse, uncomplicated: Secondary | ICD-10-CM | POA: Diagnosis not present

## 2021-12-05 DIAGNOSIS — R410 Disorientation, unspecified: Secondary | ICD-10-CM | POA: Diagnosis not present

## 2021-12-05 DIAGNOSIS — F1910 Other psychoactive substance abuse, uncomplicated: Secondary | ICD-10-CM

## 2021-12-05 DIAGNOSIS — Z9114 Patient's other noncompliance with medication regimen: Secondary | ICD-10-CM | POA: Insufficient documentation

## 2021-12-05 DIAGNOSIS — Y905 Blood alcohol level of 100-119 mg/100 ml: Secondary | ICD-10-CM | POA: Diagnosis not present

## 2021-12-05 DIAGNOSIS — R569 Unspecified convulsions: Secondary | ICD-10-CM

## 2021-12-05 LAB — COMPREHENSIVE METABOLIC PANEL
ALT: 20 U/L (ref 0–44)
AST: 20 U/L (ref 15–41)
Albumin: 3.8 g/dL (ref 3.5–5.0)
Alkaline Phosphatase: 48 U/L (ref 38–126)
Anion gap: 7 (ref 5–15)
BUN: 10 mg/dL (ref 6–20)
CO2: 21 mmol/L — ABNORMAL LOW (ref 22–32)
Calcium: 8.5 mg/dL — ABNORMAL LOW (ref 8.9–10.3)
Chloride: 107 mmol/L (ref 98–111)
Creatinine, Ser: 0.68 mg/dL (ref 0.44–1.00)
GFR, Estimated: >60 mL/min (ref >60)
Glucose, Bld: 97 mg/dL (ref 70–99)
Potassium: 3.4 mmol/L — ABNORMAL LOW (ref 3.5–5.1)
Sodium: 135 mmol/L (ref 135–145)
Total Bilirubin: 0.2 mg/dL — ABNORMAL LOW (ref 0.3–1.2)
Total Protein: 6.8 g/dL (ref 6.5–8.1)

## 2021-12-05 LAB — CBC WITH DIFFERENTIAL/PLATELET
Abs Immature Granulocytes: 0.02 10*3/uL (ref 0.00–0.07)
Basophils Absolute: 0 10*3/uL (ref 0.0–0.1)
Basophils Relative: 1 %
Eosinophils Absolute: 0.1 10*3/uL (ref 0.0–0.5)
Eosinophils Relative: 1 %
HCT: 35 % — ABNORMAL LOW (ref 36.0–46.0)
Hemoglobin: 12.1 g/dL (ref 12.0–15.0)
Immature Granulocytes: 0 %
Lymphocytes Relative: 34 %
Lymphs Abs: 2 10*3/uL (ref 0.7–4.0)
MCH: 32.3 pg (ref 26.0–34.0)
MCHC: 34.6 g/dL (ref 30.0–36.0)
MCV: 93.3 fL (ref 80.0–100.0)
Monocytes Absolute: 0.3 10*3/uL (ref 0.1–1.0)
Monocytes Relative: 6 %
Neutro Abs: 3.4 10*3/uL (ref 1.7–7.7)
Neutrophils Relative %: 58 %
Platelets: 168 10*3/uL (ref 150–400)
RBC: 3.75 MIL/uL — ABNORMAL LOW (ref 3.87–5.11)
RDW: 12.7 % (ref 11.5–15.5)
WBC: 5.9 10*3/uL (ref 4.0–10.5)
nRBC: 0 % (ref 0.0–0.2)

## 2021-12-05 LAB — BLOOD GAS, VENOUS
Acid-base deficit: 4 mmol/L — ABNORMAL HIGH (ref 0.0–2.0)
Bicarbonate: 20.9 mmol/L (ref 20.0–28.0)
O2 Saturation: 93.6 %
Patient temperature: 37.1
pCO2, Ven: 37 mmHg — ABNORMAL LOW (ref 44–60)
pH, Ven: 7.36 (ref 7.25–7.43)
pO2, Ven: 63 mmHg — ABNORMAL HIGH (ref 32–45)

## 2021-12-05 LAB — CBG MONITORING, ED: Glucose-Capillary: 80 mg/dL (ref 70–99)

## 2021-12-05 LAB — URINALYSIS, ROUTINE W REFLEX MICROSCOPIC
Bacteria, UA: NONE SEEN
Bilirubin Urine: NEGATIVE
Glucose, UA: NEGATIVE mg/dL
Ketones, ur: NEGATIVE mg/dL
Leukocytes,Ua: NEGATIVE
Nitrite: NEGATIVE
Protein, ur: NEGATIVE mg/dL
Specific Gravity, Urine: 1.003 — ABNORMAL LOW (ref 1.005–1.030)
pH: 5 (ref 5.0–8.0)

## 2021-12-05 LAB — AMMONIA: Ammonia: 30 umol/L (ref 9–35)

## 2021-12-05 LAB — RAPID URINE DRUG SCREEN, HOSP PERFORMED
Amphetamines: NOT DETECTED
Barbiturates: NOT DETECTED
Benzodiazepines: NOT DETECTED
Cocaine: POSITIVE — AB
Opiates: NOT DETECTED
Tetrahydrocannabinol: NOT DETECTED

## 2021-12-05 LAB — CK: Total CK: 162 U/L (ref 38–234)

## 2021-12-05 LAB — TROPONIN I (HIGH SENSITIVITY)
Troponin I (High Sensitivity): 3 ng/L (ref <18)
Troponin I (High Sensitivity): 3 ng/L (ref <18)

## 2021-12-05 LAB — HCG, SERUM, QUALITATIVE: Preg, Serum: NEGATIVE

## 2021-12-05 LAB — ETHANOL: Alcohol, Ethyl (B): 107 mg/dL — ABNORMAL HIGH (ref <10)

## 2021-12-05 LAB — OSMOLALITY: Osmolality: 312 mOsm/kg — ABNORMAL HIGH (ref 275–295)

## 2021-12-05 LAB — TSH: TSH: 2.198 u[IU]/mL (ref 0.350–4.500)

## 2021-12-05 MED ORDER — SODIUM CHLORIDE 0.9 % IV BOLUS
1000.0000 mL | Freq: Once | INTRAVENOUS | Status: AC
  Administered 2021-12-05: 1000 mL via INTRAVENOUS

## 2021-12-05 MED ORDER — LEVETIRACETAM IN NACL 1500 MG/100ML IV SOLN
1500.0000 mg | Freq: Once | INTRAVENOUS | Status: AC
Start: 2021-12-05 — End: 2021-12-05
  Administered 2021-12-05: 1500 mg via INTRAVENOUS
  Filled 2021-12-05: qty 100

## 2021-12-05 MED ORDER — LORAZEPAM 2 MG/ML IJ SOLN: 2.0000 mg | Freq: Once | INTRAMUSCULAR | Status: AC

## 2021-12-05 MED ORDER — LORAZEPAM 2 MG/ML IJ SOLN
INTRAMUSCULAR | Status: AC
  Administered 2021-12-05: 2 mg via INTRAVENOUS
  Filled 2021-12-05: qty 1

## 2021-12-05 MED ORDER — HALOPERIDOL LACTATE 5 MG/ML IJ SOLN
INTRAMUSCULAR | Status: AC
  Filled 2021-12-05: qty 1

## 2021-12-05 NOTE — ED Triage Notes (Signed)
Patient BIB EMS after being found " unresponsive " on the ground in a fetal position . 911 called by mom who lives in [place] , mom states patient has epilepsy and diabetes . Patient takes keppra daily, not sure if she is compliant. EMS did sternum rub and the patient responded but not able to recall her name . EMS reports a few "shaking scenarios  " . 18g placed by EMS to the left hand .  ?

## 2021-12-05 NOTE — ED Notes (Addendum)
Patient ambulated to restroom with staff, walked with steady gait and required minimal assistance. Patient currently resting.  ?

## 2021-12-05 NOTE — ED Provider Notes (Addendum)
[facility] DEPT Provider Note   CSN: [id number] Arrival date & time: 12/05/21  0308     History  Chief Complaint  Patient presents with   Altered Mental Status    Jennifer Crawford is a 46 y.o. female.  This is a 46 y.o. female  with significant medical history as below, including polysubstance abuse, alcohol abuse, seizure with variable medication compliance, diabetes who presents to the ED with complaint of seizure.  EMS was called with patient's mother, reports that they were talking on the phone and patient became unresponsive.  On EMS arrival, she would not open the door so the domicile had to be forcibly entered.  Patient was lying on the ground by her bed.  No external evidence of trauma was noted by EMS.  C-collar was placed.  Patient had multiple episodes and EMS presents for she would start shaking for few seconds, this would subside, then she would start speaking to EMS, no post ictal period noted.  Answering questions appropriately.  Moving extremities spontaneously and purposefully following these episodes.  Patient reports drinking 1 regular sized beer tonight, no recent cocaine or illicit drug use.  Variable compliance with Keppra.  She feels like her last seizure was around 6 wks ago.  Does not regularly follow with neurology.  She says last cocaine was more than 3 months ago.     Past Medical History: No date: Anxiety and depression No date: Diabetes mellitus without complication ([place]) 56/2563: Right flank pain 8/22020: Right upper quadrant pain 2015: Seizures ([facility]) 05/2019: Vitamin D deficiency  Past Surgical History: No date: ABDOMINAL HYSTERECTOMY No date: TUBAL LIGATION    The history is provided by the patient and the EMS personnel. No language interpreter was used.  Altered Mental Status     Home Medications Prior to Admission medications   Medication Sig Start Date End Date Taking? Authorizing Provider  albuterol  (PROVENTIL) (2.5 MG/3ML) 0.083% nebulizer solution Take 3 mLs (2.5 mg total) by nebulization every 6 (six) hours as needed for wheezing or shortness of breath. 04/04/20   Truddie Hidden, MD  albuterol (VENTOLIN HFA) 108 (90 Base) MCG/ACT inhaler Inhale 2 puffs into the lungs every 4 (four) hours as needed for wheezing or shortness of breath. 02/10/20   Margarita Mail, PA-C  blood glucose meter kit and supplies Dispense based on patient and insurance preference. Use up to four times daily as directed. (FOR ICD-10 E10.9, E11.9). 05/13/19   Azzie Glatter, FNP  Budesonide (PULMICORT FLEXHALER) 90 MCG/ACT inhaler Inhale 1 puff into the lungs 2 (two) times daily. Rinse mouth with water and spit after every use. Patient taking differently: Inhale 1 puff into the lungs daily as needed (for shortness of breath). Rinse mouth with water and spit after every use. 10/13/20   Burky, Malachy Moan, NP  levETIRAcetam (KEPPRA) 1000 MG tablet Take 1 tablet (1,000 mg total) by mouth 2 (two) times daily. 11/06/21   Larene Pickett, PA-C  lidocaine (LIDODERM) 5 % Place 1 patch onto the skin daily. Remove & Discard patch within 12 hours or as directed by MD 09/23/21   Malvin Johns, MD  naproxen (NAPROSYN) 500 MG tablet Take 1 tablet (500 mg total) by mouth 2 (two) times daily. 12/05/20   Henderly, Britni A, PA-C      Allergies    Ibuprofen, Tramadol, and Acetaminophen    Review of Systems   Review of Systems  Physical Exam Updated Vital Signs BP 123/84  Pulse 75    Temp 97.7 F (36.5 C) (Axillary)    Resp 14    SpO2 100%  Physical Exam Vitals and nursing note reviewed.  Constitutional:      General: She is not in acute distress.    Appearance: Normal appearance. She is well-developed.  HENT:     Head: Normocephalic and atraumatic. No raccoon eyes, Battle's sign, right periorbital erythema or left periorbital erythema.     Right Ear: External ear normal.     Left Ear: External ear normal.     Nose: Nose  normal.     Mouth/Throat:     Mouth: Mucous membranes are moist.  Eyes:     General: No scleral icterus.       Right eye: No discharge.        Left eye: No discharge.     Conjunctiva/sclera: Conjunctivae normal.  Cardiovascular:     Rate and Rhythm: Normal rate and regular rhythm.     Pulses: Normal pulses.     Heart sounds: Normal heart sounds. No murmur heard. Pulmonary:     Effort: Pulmonary effort is normal. No respiratory distress.     Breath sounds: Normal breath sounds.  Abdominal:     General: Abdomen is flat.     Palpations: Abdomen is soft. There is no mass.     Tenderness: There is no abdominal tenderness.  Musculoskeletal:        General: No swelling. Normal range of motion.     Cervical back: Normal range of motion and neck supple.     Right lower leg: No edema.     Left lower leg: No edema.  Skin:    General: Skin is warm and dry.     Capillary Refill: Capillary refill takes less than 2 seconds.  Neurological:     Mental Status: She is alert and oriented to person, place, and time.     GCS: GCS eye subscore is 4. GCS verbal subscore is 5. GCS motor subscore is 6.     Cranial Nerves: Cranial nerves 2-12 are intact.     Sensory: Sensation is intact.     Motor: Motor function is intact.     Coordination: Coordination is intact.     Gait: Gait is intact. Gait normal.  Psychiatric:        Mood and Affect: Mood normal.        Behavior: Behavior normal.    ED Results / Procedures / Treatments   Labs (all labs ordered are listed, but only abnormal results are displayed) Labs Reviewed  CBC WITH DIFFERENTIAL/PLATELET - Abnormal; Notable for the following components:      Result Value   RBC 3.75 (*)    HCT 35.0 (*)    All other components within normal limits  COMPREHENSIVE METABOLIC PANEL - Abnormal; Notable for the following components:   Potassium 3.4 (*)    CO2 21 (*)    Calcium 8.5 (*)    Total Bilirubin 0.2 (*)    All other components within normal  limits  URINALYSIS, ROUTINE W REFLEX MICROSCOPIC - Abnormal; Notable for the following components:   Color, Urine COLORLESS (*)    Specific Gravity, Urine 1.003 (*)    Hgb urine dipstick MODERATE (*)    All other components within normal limits  ETHANOL - Abnormal; Notable for the following components:   Alcohol, Ethyl (B) 107 (*)    All other components within normal limits  RAPID URINE DRUG SCREEN,  HOSP PERFORMED - Abnormal; Notable for the following components:   Cocaine POSITIVE (*)    All other components within normal limits  BLOOD GAS, VENOUS - Abnormal; Notable for the following components:   pCO2, Ven 37 (*)    pO2, Ven 63 (*)    Acid-base deficit 4.0 (*)    All other components within normal limits  OSMOLALITY - Abnormal; Notable for the following components:   Osmolality 312 (*)    All other components within normal limits  CK  HCG, SERUM, QUALITATIVE  AMMONIA  TSH  LEVETIRACETAM LEVEL  CBG MONITORING, ED  TROPONIN I (HIGH SENSITIVITY)  TROPONIN I (HIGH SENSITIVITY)    EKG EKG Interpretation  Date/Time:  Monday December 05 2021 04:09:25 EST Ventricular Rate:  91 PR Interval:  194 QRS Duration: 88 QT Interval:  366 QTC Calculation: 451 R Axis:   77 Text Interpretation: Sinus rhythm LVH with secondary repolarization abnormality Similar to prior Confirmed by Wynona Dove ([id number]) on 12/05/2021 6:23:49 AM  Radiology CT Head Wo Contrast  Result Date: 12/05/2021 CLINICAL DATA:  Delirium. EXAM: CT HEAD WITHOUT CONTRAST TECHNIQUE: Contiguous axial images were obtained from the base of the skull through the vertex without intravenous contrast. RADIATION DOSE REDUCTION: This exam was performed according to the departmental dose-optimization program which includes automated exposure control, adjustment of the mA and/or kV according to patient size and/or use of iterative reconstruction technique. COMPARISON:  09/13/2021. FINDINGS: Brain: No acute intracranial hemorrhage, midline  shift or mass effect. No extra-axial fluid collection. Johnathan Heskett-white matter differentiation is within normal limits. No hydrocephalus. Vascular: No hyperdense vessel or unexpected calcification. Skull: Normal. Negative for fracture or focal lesion. Sinuses/Orbits: No acute finding. Other: None. IMPRESSION: No acute intracranial process. Electronically Signed   By: Brett Fairy M.D.   On: 12/05/2021 04:38   DG Chest Portable 1 View  Result Date: 12/05/2021 CLINICAL DATA:  Altered mental status EXAM: PORTABLE CHEST 1 VIEW COMPARISON:  09/13/2021 FINDINGS: Cardiac shadow is within normal limits. Mild increased vascularity is noted without focal infiltrate. No sizable effusion is noted. No bony abnormality is noted. IMPRESSION: Mild vascular congestion without significant edema. No other focal abnormality is noted. Electronically Signed   By: Inez Catalina M.D.   On: 12/05/2021 03:54    Procedures .Critical Care Performed by: Jeanell Sparrow, DO Authorized by: Jeanell Sparrow, DO   Critical care provider statement:    Critical care time (minutes):  32   Critical care time was exclusive of:  Separately billable procedures and treating other patients   Critical care was necessary to treat or prevent imminent or life-threatening deterioration of the following conditions: seizure, ams.   Critical care was time spent personally by me on the following activities:  Development of treatment plan with patient or surrogate, discussions with consultants, evaluation of patient's response to treatment, examination of patient, ordering and review of laboratory studies, ordering and review of radiographic studies, ordering and performing treatments and interventions, pulse oximetry, re-evaluation of patient's condition, review of old charts and obtaining history from patient or surrogate    Medications Ordered in ED Medications  LORazepam (ATIVAN) injection 2 mg (2 mg Intravenous Given 12/05/21 0317)  sodium chloride 0.9  % bolus 1,000 mL (0 mLs Intravenous Stopped 12/05/21 0455)  levETIRAcetam (KEPPRA) IVPB 1500 mg/ 100 mL premix (0 mg Intravenous Stopped 12/05/21 0410)  haloperidol lactate (HALDOL) 5 MG/ML injection (  Given 12/05/21 0347)    ED Course/ Medical Decision Making/ A&P  Medical Decision Making Amount and/or Complexity of Data Reviewed Labs: ordered. Radiology: ordered.  Risk Prescription drug management.    CC: AMS  This patient presents to the Emergency Department for the above complaint. This involves an extensive number of treatment options and is a complaint that carries with it a high risk of complications and morbidity. Vital signs were reviewed. Serious etiologies considered.   I observed patient having a seizure-like episode, she had jerking of her extremities.  She withdrew her arm when I attempted to move it, holding her eyelid shut when I attempted to open them.  There was no postictal period, no desaturation.  Movements resolved spontaneously without intervention.  Record review:  Previous records obtained and reviewed   Additional history obtained from EMS  Medical and surgical history as noted above.   Work up as above, notable for:  Labs & imaging results that were available during my care of the patient were visualized by me and considered in my medical decision making.   I ordered imaging studies which included CTH, CXR and I visualized the imaging and I agree with radiologist interpretation. No acute process  Cardiac monitoring reviewed and interpreted personally which shows sinus tachycardia   Labs reviewed, ethanol elevated, UDS positive for cocaine, CPK is nominal.  Labs otherwise unremarkable.  My suspicion for epileptiform seizure is very low; favor pseudo seizure given no post ictal period and pt with purposeful movement while having episode.    Social determinants of health include - N/a  Management: Given ativan 2/2 pt combative,  she was then given haldol. This did improve her combativeness.   She was loaded with keppra given hx of sz with med noncomplaince   Reassessment:  Patient is resting comfortably.  Given reassuring work-up today, suspicion for acute intracranial process very low.  Suspicion for nonepileptic seizures, polysubstance abuse.  Recommend patient follow-up outpatient setting with her primary care doctor.  Given resources regarding outpatient rehab.  Advised her to refrain from illicit substances and alcohol. No evidence at this time of ETOH withdrawal.   Pt signed out to incoming EDP pending clinical sobriety. She is HDS, protecting her airway at time of shift change.    Counseled patient for approximately 5 minutes regarding smoking cessation. Discussed risks of smoking and how they applied and affected their visit here today. Patient not\ ready to quit at this time, however will follow up with their primary doctor when they are.   CPT code: [phone]: intermediate counseling for smoking cessation      This chart was dictated using voice recognition software.  Despite best efforts to proofread,  errors can occur which can change the documentation meaning.         Final Clinical Impression(s) / ED Diagnoses Final diagnoses:  Polysubstance abuse ([place])  Noncompliance with medications  Delirium  Alcohol abuse    Rx / DC Orders ED Discharge Orders     None         Jeanell Sparrow, DO 12/05/21 0732    Jeanell Sparrow, DO 12/06/21 2100

## 2021-12-05 NOTE — ED Provider Notes (Signed)
?  Patient seen after prior EDP. ? ?Patient feels improved.  She is ambulating with steady gait.  She desires DC home. ? ?Importance of close follow-up stressed.  Strict return precautions given and understood. ? ?  ?Wynetta Fines, MD ?12/05/21 1202 ? ?

## 2021-12-05 NOTE — Discharge Instructions (Addendum)
It was a pleasure caring for you today in the emergency department. ° °Please return to the emergency department for any worsening or worrisome symptoms. ° ° °

## 2021-12-06 LAB — LEVETIRACETAM LEVEL: Levetiracetam Lvl: 75.4 ug/mL — ABNORMAL HIGH (ref 10.0–40.0)

## 2022-01-20 ENCOUNTER — Emergency Department (HOSPITAL_COMMUNITY)
Admission: EM | Admit: 2022-01-20 | Discharge: 2022-01-21 | Disposition: A | Discharge status: Home / Self Care | Payer: Medicaid Other | Source: Home / Self Care | Attending: Emergency Medicine | Admitting: Emergency Medicine

## 2022-01-20 ENCOUNTER — Other Ambulatory Visit: Payer: Self-pay

## 2022-01-20 ENCOUNTER — Encounter (HOSPITAL_COMMUNITY): Payer: Self-pay

## 2022-01-20 ENCOUNTER — Emergency Department (HOSPITAL_COMMUNITY)
Admission: EM | Admit: 2022-01-20 | Discharge: 2022-01-20 | Discharge status: Against Medical Advice | Payer: Medicaid Other | Attending: Emergency Medicine | Admitting: Emergency Medicine

## 2022-01-20 DIAGNOSIS — E119 Type 2 diabetes mellitus without complications: Secondary | ICD-10-CM | POA: Insufficient documentation

## 2022-01-20 DIAGNOSIS — F1490 Cocaine use, unspecified, uncomplicated: Secondary | ICD-10-CM | POA: Diagnosis not present

## 2022-01-20 DIAGNOSIS — Y904 Blood alcohol level of 80-99 mg/100 ml: Secondary | ICD-10-CM | POA: Insufficient documentation

## 2022-01-20 DIAGNOSIS — R569 Unspecified convulsions: Secondary | ICD-10-CM | POA: Insufficient documentation

## 2022-01-20 DIAGNOSIS — D649 Anemia, unspecified: Secondary | ICD-10-CM | POA: Insufficient documentation

## 2022-01-20 DIAGNOSIS — F1090 Alcohol use, unspecified, uncomplicated: Secondary | ICD-10-CM | POA: Diagnosis not present

## 2022-01-20 DIAGNOSIS — Z5321 Procedure and treatment not carried out due to patient leaving prior to being seen by health care provider: Secondary | ICD-10-CM | POA: Insufficient documentation

## 2022-01-20 DIAGNOSIS — R030 Elevated blood-pressure reading, without diagnosis of hypertension: Secondary | ICD-10-CM | POA: Insufficient documentation

## 2022-01-20 DIAGNOSIS — R4182 Altered mental status, unspecified: Secondary | ICD-10-CM | POA: Diagnosis not present

## 2022-01-20 NOTE — ED Triage Notes (Signed)
Pt was found by visitors outside passed out. Pt reports having seizures and states that she is fine.  ?

## 2022-01-20 NOTE — ED Notes (Signed)
Pt states that she is leaving and has walked out.  ?

## 2022-01-21 ENCOUNTER — Emergency Department (HOSPITAL_COMMUNITY): Payer: Medicaid Other

## 2022-01-21 ENCOUNTER — Encounter (HOSPITAL_COMMUNITY): Payer: Self-pay | Admitting: Emergency Medicine

## 2022-01-21 LAB — CBG MONITORING, ED: Glucose-Capillary: 81 mg/dL (ref 70–99)

## 2022-01-21 LAB — BASIC METABOLIC PANEL
Anion gap: 7 (ref 5–15)
BUN: 9 mg/dL (ref 6–20)
CO2: 24 mmol/L (ref 22–32)
Calcium: 8.9 mg/dL (ref 8.9–10.3)
Chloride: 106 mmol/L (ref 98–111)
Creatinine, Ser: 0.78 mg/dL (ref 0.44–1.00)
GFR, Estimated: >60 mL/min (ref >60)
Glucose, Bld: 87 mg/dL (ref 70–99)
Potassium: 3.7 mmol/L (ref 3.5–5.1)
Sodium: 137 mmol/L (ref 135–145)

## 2022-01-21 LAB — CBC
HCT: 34.6 % — ABNORMAL LOW (ref 36.0–46.0)
Hemoglobin: 11.5 g/dL — ABNORMAL LOW (ref 12.0–15.0)
MCH: 31.6 pg (ref 26.0–34.0)
MCHC: 33.2 g/dL (ref 30.0–36.0)
MCV: 95.1 fL (ref 80.0–100.0)
Platelets: 210 10*3/uL (ref 150–400)
RBC: 3.64 MIL/uL — ABNORMAL LOW (ref 3.87–5.11)
RDW: 13.2 % (ref 11.5–15.5)
WBC: 6.9 10*3/uL (ref 4.0–10.5)
nRBC: 0 % (ref 0.0–0.2)

## 2022-01-21 LAB — RAPID URINE DRUG SCREEN, HOSP PERFORMED
Amphetamines: NOT DETECTED
Barbiturates: POSITIVE — AB
Benzodiazepines: NOT DETECTED
Cocaine: POSITIVE — AB
Opiates: NOT DETECTED
Tetrahydrocannabinol: NOT DETECTED

## 2022-01-21 LAB — I-STAT BETA HCG BLOOD, ED (MC, WL, AP ONLY): I-stat hCG, quantitative: <5 m[IU]/mL (ref <5)

## 2022-01-21 LAB — ETHANOL: Alcohol, Ethyl (B): 84 mg/dL — ABNORMAL HIGH (ref <10)

## 2022-01-21 MED ORDER — LEVETIRACETAM IN NACL 1500 MG/100ML IV SOLN
1500.0000 mg | Freq: Once | INTRAVENOUS | Status: AC
  Administered 2022-01-21: 1500 mg via INTRAVENOUS
  Filled 2022-01-21: qty 100

## 2022-01-21 MED ORDER — SODIUM CHLORIDE 0.9 % IV BOLUS
1000.0000 mL | Freq: Once | INTRAVENOUS | Status: AC
  Administered 2022-01-21: 1000 mL via INTRAVENOUS

## 2022-01-21 NOTE — ED Notes (Signed)
Patient ambulatory to the restroom under her own power.  ?

## 2022-01-21 NOTE — ED Triage Notes (Signed)
Pt left AMA from room 18 ~ 1 hour ago. Now back c/o having a seizure in the parking lot. Pt ambulatory to room. NAD.  ?

## 2022-01-21 NOTE — ED Notes (Signed)
Patient transported to CT 

## 2022-01-21 NOTE — Discharge Instructions (Addendum)
You were seen in the ER tonight after seizures.  ?Your head CT was normal.  ?Your labs showed mild anemia- please have this rechecked by your doctor.  ?Take your seizure medication as prescribed.  ?Do NOT drive or operate heavy machinery until cleared by neurology. Call to make soonest available appointment. ? ?Please see resources for counseling.  ? ?Follow up with primary care as soon as possible. Return to the Er for new or worsening symptoms or any other concerns.  ? ? ?

## 2022-01-21 NOTE — ED Provider Notes (Signed)
?[facility] ?Provider Note ? ? ?CSN: [id number] ?Arrival date & time: 01/20/22  2351 ? ?  ? ?History ? ?Chief Complaint  ?Patient presents with  ? Seizures  ? ? ?Jennifer Crawford is a 46 y.o. female with a history of seizures, diabetes mellitus, EtOH use, and cocaine use who presents to the ED tonight due to seizure.  Patient reports that she thinks that she had 2 seizures tonight.  She thinks each lasted a brief period of time.  She did hit her head.  She currently feels tired.  Also notes that she has had a few alcoholic beverages tonight she has been feeling sad about the death of her daughter.  No leaving aggravating factors.  States that she does not drink daily, denies history of alcohol withdrawal.  Additionally denies SI, HI, or hallucinations.  Denies pain. She has missed her last 2-3 doses of seizure medications.  ? ?HPI ? ?  ? ?Home Medications ?Prior to Admission medications   ?Medication Sig Start Date End Date Taking? Authorizing Provider  ?albuterol (PROVENTIL) (2.5 MG/3ML) 0.083% nebulizer solution Take 3 mLs (2.5 mg total) by nebulization every 6 (six) hours as needed for wheezing or shortness of breath. 04/04/20   Truddie Hidden, MD  ?albuterol (VENTOLIN HFA) 108 (90 Base) MCG/ACT inhaler Inhale 2 puffs into the lungs every 4 (four) hours as needed for wheezing or shortness of breath. 02/10/20   Margarita Mail, PA-C  ?blood glucose meter kit and supplies Dispense based on patient and insurance preference. Use up to four times daily as directed. (FOR ICD-10 E10.9, E11.9). 05/13/19   Azzie Glatter, FNP  ?Budesonide (PULMICORT FLEXHALER) 90 MCG/ACT inhaler Inhale 1 puff into the lungs 2 (two) times daily. Rinse mouth with water and spit after every use. ?Patient taking differently: Inhale 1 puff into the lungs daily as needed (for shortness of breath). Rinse mouth with water and spit after every use. 10/13/20   Zigmund Gottron, NP  ?levETIRAcetam (KEPPRA)  1000 MG tablet Take 1 tablet (1,000 mg total) by mouth 2 (two) times daily. 11/06/21   Larene Pickett, PA-C  ?lidocaine (LIDODERM) 5 % Place 1 patch onto the skin daily. Remove & Discard patch within 12 hours or as directed by MD 09/23/21   Malvin Johns, MD  ?naproxen (NAPROSYN) 500 MG tablet Take 1 tablet (500 mg total) by mouth 2 (two) times daily. 12/05/20   Henderly, Britni A, PA-C  ?   ? ?Allergies    ?Ibuprofen, Tramadol, and Acetaminophen   ? ?Review of Systems   ?Review of Systems  ?Constitutional:  Negative for chills and fever.  ?Eyes:  Negative for visual disturbance.  ?Respiratory:  Negative for shortness of breath.   ?Cardiovascular:  Negative for chest pain.  ?Gastrointestinal:  Negative for abdominal pain and vomiting.  ?Neurological:  Positive for seizures. Negative for weakness, numbness and headaches.  ?All other systems reviewed and are negative. ? ?Physical Exam ?Updated Vital Signs ?BP (!) 141/91   Pulse 78   Temp 98 ?F (36.7 ?C) (Oral)   Resp 16   Ht 5' 9"  (1.753 m)   Wt 74.8 kg   SpO2 97%   BMI 24.37 kg/m?  ?Physical Exam ?Vitals and nursing note reviewed.  ?Constitutional:   ?   General: She is not in acute distress. ?   Appearance: She is well-developed. She is not toxic-appearing.  ?HENT:  ?   Head: Normocephalic and atraumatic.  ?Eyes:  ?  General: Vision grossly intact. Gaze aligned appropriately.     ?   Right eye: No discharge.     ?   Left eye: No discharge.  ?   Extraocular Movements: Extraocular movements intact.  ?   Conjunctiva/sclera: Conjunctivae normal.  ?   Comments: PERRL. No proptosis.   ?Cardiovascular:  ?   Rate and Rhythm: Normal rate and regular rhythm.  ?Pulmonary:  ?   Effort: Pulmonary effort is normal. No respiratory distress.  ?   Breath sounds: Normal breath sounds. No wheezing, rhonchi or rales.  ?Chest:  ?   Chest wall: No tenderness.  ?Abdominal:  ?   General: There is no distension.  ?   Palpations: Abdomen is soft.  ?   Tenderness: There is no abdominal  tenderness.  ?Musculoskeletal:  ?   Cervical back: Normal range of motion and neck supple. No rigidity or tenderness.  ?   Comments: No midline spinal tenderness.  Ranging all extremities without focal bony tenderness.  ?Skin: ?   General: Skin is warm and dry.  ?   Findings: No rash.  ?Neurological:  ?   Comments: Alert. Clear speech. No facial droop. CNIII-XII grossly intact. Bilateral upper and lower extremities' sensation grossly intact. 5/5 symmetric strength with grip strength and with plantar and dorsi flexion bilaterally .  Ambulatory.    ?Psychiatric:     ?   Thought Content: Thought content does not include homicidal or suicidal ideation.  ? ? ?ED Results / Procedures / Treatments   ?Labs ?(all labs ordered are listed, but only abnormal results are displayed) ?Labs Reviewed  ?CBC - Abnormal; Notable for the following components:  ?    Result Value  ? RBC 3.64 (*)   ? Hemoglobin 11.5 (*)   ? HCT 34.6 (*)   ? All other components within normal limits  ?ETHANOL - Abnormal; Notable for the following components:  ? Alcohol, Ethyl (B) 84 (*)   ? All other components within normal limits  ?RAPID URINE DRUG SCREEN, HOSP PERFORMED - Abnormal; Notable for the following components:  ? Cocaine POSITIVE (*)   ? Barbiturates POSITIVE (*)   ? All other components within normal limits  ?BASIC METABOLIC PANEL  ?CBG MONITORING, ED  ?I-STAT BETA HCG BLOOD, ED ([facility], [facility], [facility] ONLY)  ? ? ?EKG ?EKG Interpretation ? ?Date/Time:  Saturday January 21 2022 00:41:27 EDT ?Ventricular Rate:  64 ?PR Interval:  189 ?QRS Duration: 82 ?QT Interval:  423 ?QTC Calculation: 437 ?R Axis:   75 ?Text Interpretation: Sinus rhythm Consider left ventricular hypertrophy Confirmed by Quintella Reichert [phone]) on 01/21/2022 12:56:15 AM ? ?Radiology ?CT Head Wo Contrast ? ?Result Date: 01/21/2022 ?CLINICAL DATA:  Head trauma, abnormal mental status (Age 38-64y) EXAM: CT HEAD WITHOUT CONTRAST TECHNIQUE: Contiguous axial images were obtained from the base of the  skull through the vertex without intravenous contrast. RADIATION DOSE REDUCTION: This exam was performed according to the departmental dose-optimization program which includes automated exposure control, adjustment of the mA and/or kV according to patient size and/or use of iterative reconstruction technique. COMPARISON:  12/05/2021 FINDINGS: Brain: No acute intracranial abnormality. Specifically, no hemorrhage, hydrocephalus, mass lesion, acute infarction, or significant intracranial injury. Vascular: No hyperdense vessel or unexpected calcification. Skull: No acute calvarial abnormality. Sinuses/Orbits: No acute findings Other: None IMPRESSION: Normal study. Electronically Signed   By: Rolm Baptise M.D.   On: 01/21/2022 00:21   ? ?Procedures ?Procedures  ? ? ?Medications Ordered in ED ?Medications  ?sodium  chloride 0.9 % bolus 1,000 mL (has no administration in time range)  ? ? ?ED Course/ Medical Decision Making/ A&P ?  ?                        ?Medical Decision Making ?Amount and/or Complexity of Data Reviewed ?Labs: ordered. ?Radiology: ordered. ? ?Risk ?Prescription drug management. ? ?Patient presents to the ED with complaints of seizures tonight, this involves an extensive number of treatment options, and is a complaint that carries with it a high risk of complications and morbidity. Nontoxic, vitals w/ elevated BP on arrival.  ? ?Additional history obtained:  ?Chart & nursing note reviewed.  ? ?EKG: sinus rhythm, consider LVH ? ?Lab Tests:  ?I reviewed & interpreted labs including:  ?CBG: WNL ?CBC: mild anemia ?BMP: Unremarkable.  ?Ethanol: mild elevation.  ?Pregnancy test: Negative ?UDS: + for cocaine & barbiturates.  ? ?Imaging Studies ordered:  ?I ordered and viewed the following imaging, agree with radiologist impression:  ?CT head:  Normal study ? ?ED Course:  ?Patient has not been taking her antiepileptic medication which I suspect may have lead to seizure tonight, keppra loading dose given in the ED.  No critical electrolyte derangement. CT head without bleed or other acute process. No other signs to indicate EtOH withdrawal seizure. Patient has had some drugs/etoh. Metabolized in the ED, requesting disc

## 2022-02-18 ENCOUNTER — Emergency Department (HOSPITAL_COMMUNITY): Payer: No Typology Code available for payment source

## 2022-02-18 ENCOUNTER — Encounter (HOSPITAL_COMMUNITY): Payer: Self-pay | Admitting: Emergency Medicine

## 2022-02-18 ENCOUNTER — Emergency Department (HOSPITAL_COMMUNITY)
Admission: EM | Admit: 2022-02-18 | Discharge: 2022-02-19 | Disposition: A | Discharge status: Home / Self Care | Payer: No Typology Code available for payment source | Attending: Emergency Medicine | Admitting: Emergency Medicine

## 2022-02-18 DIAGNOSIS — Y906 Blood alcohol level of 120-199 mg/100 ml: Secondary | ICD-10-CM | POA: Insufficient documentation

## 2022-02-18 DIAGNOSIS — R569 Unspecified convulsions: Secondary | ICD-10-CM | POA: Diagnosis not present

## 2022-02-18 DIAGNOSIS — F10120 Alcohol abuse with intoxication, uncomplicated: Secondary | ICD-10-CM | POA: Diagnosis present

## 2022-02-18 DIAGNOSIS — F10920 Alcohol use, unspecified with intoxication, uncomplicated: Secondary | ICD-10-CM

## 2022-02-18 LAB — COMPREHENSIVE METABOLIC PANEL
ALT: 18 U/L (ref 0–44)
AST: 24 U/L (ref 15–41)
Albumin: 4.4 g/dL (ref 3.5–5.0)
Alkaline Phosphatase: 72 U/L (ref 38–126)
Anion gap: 10 (ref 5–15)
BUN: 12 mg/dL (ref 6–20)
CO2: 22 mmol/L (ref 22–32)
Calcium: 9.4 mg/dL (ref 8.9–10.3)
Chloride: 107 mmol/L (ref 98–111)
Creatinine, Ser: 0.96 mg/dL (ref 0.44–1.00)
GFR, Estimated: >60 mL/min (ref >60)
Glucose, Bld: 90 mg/dL (ref 70–99)
Potassium: 3.6 mmol/L (ref 3.5–5.1)
Sodium: 139 mmol/L (ref 135–145)
Total Bilirubin: 0.6 mg/dL (ref 0.3–1.2)
Total Protein: 8.9 g/dL — ABNORMAL HIGH (ref 6.5–8.1)

## 2022-02-18 LAB — CBC WITH DIFFERENTIAL/PLATELET
Abs Immature Granulocytes: 0.01 10*3/uL (ref 0.00–0.07)
Basophils Absolute: 0 10*3/uL (ref 0.0–0.1)
Basophils Relative: 1 %
Eosinophils Absolute: 0.1 10*3/uL (ref 0.0–0.5)
Eosinophils Relative: 1 %
HCT: 40.4 % (ref 36.0–46.0)
Hemoglobin: 13.2 g/dL (ref 12.0–15.0)
Immature Granulocytes: 0 %
Lymphocytes Relative: 51 %
Lymphs Abs: 3.7 10*3/uL (ref 0.7–4.0)
MCH: 31.1 pg (ref 26.0–34.0)
MCHC: 32.7 g/dL (ref 30.0–36.0)
MCV: 95.1 fL (ref 80.0–100.0)
Monocytes Absolute: 0.3 10*3/uL (ref 0.1–1.0)
Monocytes Relative: 4 %
Neutro Abs: 3.1 10*3/uL (ref 1.7–7.7)
Neutrophils Relative %: 43 %
Platelets: 201 10*3/uL (ref 150–400)
RBC: 4.25 MIL/uL (ref 3.87–5.11)
RDW: 13 % (ref 11.5–15.5)
WBC: 7.2 10*3/uL (ref 4.0–10.5)
nRBC: 0 % (ref 0.0–0.2)

## 2022-02-18 LAB — ETHANOL: Alcohol, Ethyl (B): 122 mg/dL — ABNORMAL HIGH (ref <10)

## 2022-02-18 LAB — I-STAT BETA HCG BLOOD, ED (MC, WL, AP ONLY): I-stat hCG, quantitative: <5 m[IU]/mL (ref <5)

## 2022-02-18 LAB — I-STAT CHEM 8, ED
BUN: 14 mg/dL (ref 6–20)
Calcium, Ion: 0.98 mmol/L — ABNORMAL LOW (ref 1.15–1.40)
Chloride: 107 mmol/L (ref 98–111)
Creatinine, Ser: 1 mg/dL (ref 0.44–1.00)
Glucose, Bld: 88 mg/dL (ref 70–99)
HCT: 43 % (ref 36.0–46.0)
Hemoglobin: 14.6 g/dL (ref 12.0–15.0)
Potassium: 3.7 mmol/L (ref 3.5–5.1)
Sodium: 141 mmol/L (ref 135–145)
TCO2: 22 mmol/L (ref 22–32)

## 2022-02-18 MED ORDER — LORAZEPAM 2 MG/ML IJ SOLN
1.0000 mg | Freq: Once | INTRAMUSCULAR | Status: AC
  Administered 2022-02-18: 1 mg via INTRAVENOUS
  Filled 2022-02-18: qty 1

## 2022-02-18 MED ORDER — LEVETIRACETAM 1000 MG PO TABS: 1000.0000 mg | ORAL_TABLET | Freq: Two times a day (BID) | ORAL | 0 refills | Status: DC

## 2022-02-18 MED ORDER — LEVETIRACETAM IN NACL 1500 MG/100ML IV SOLN
1500.0000 mg | Freq: Once | INTRAVENOUS | Status: AC
Start: 2022-02-18 — End: 2022-02-18
  Administered 2022-02-18: 1500 mg via INTRAVENOUS
  Filled 2022-02-18: qty 100

## 2022-02-18 NOTE — Discharge Instructions (Addendum)
Return for any problem.  ?

## 2022-02-18 NOTE — ED Triage Notes (Signed)
Pt found in parking lot. Bystanders report pt got out of her car and put herself on the ground, then had a seizure. Pt not talking, unable to follow commands.

## 2022-02-18 NOTE — ED Notes (Signed)
CBG 71.  

## 2022-02-18 NOTE — ED Provider Notes (Signed)
[facility] EMERGENCY DEPARTMENT Provider Note   CSN: [id number] Arrival date & time: 02/18/22  1624     History  Chief Complaint  Patient presents with   Seizures    Clotiel Denithe Pittinger is a 46 y.o. female.  46 year old female with prior medical history as detailed below presents for evaluation.  Patient reports that she had been drinking alcohol earlier today.  She started to feel "not right" and so drove herself to the emergency department for evaluation.  Apparently, upon arriving at the ER parking lot she was able to self extricate from her car but then slumped down to the floor.  She did not have any obvious trauma.  She may have had some type of seizure-like activity in the parking lot.  She is brought to the ED for evaluation.  Patient with history of seizure.  Patient is alert and slowly conversant with this examiner.  She does admit to alcohol consumption.  She appears intoxicated.  She does not remember the last time she took her Keppra.  She denies other drug use.  The history is provided by the patient and medical records.  Illness Location:  Reported alcohol consumption, suspected alcohol intoxication, possible seizure activity Severity:  Mild Onset quality:  Unable to specify Timing:  Unable to specify Progression:  Unable to specify     Home Medications Prior to Admission medications   Medication Sig Start Date End Date Taking? Authorizing Provider  albuterol (PROVENTIL) (2.5 MG/3ML) 0.083% nebulizer solution Take 3 mLs (2.5 mg total) by nebulization every 6 (six) hours as needed for wheezing or shortness of breath. 04/04/20   Truddie Hidden, MD  albuterol (VENTOLIN HFA) 108 (90 Base) MCG/ACT inhaler Inhale 2 puffs into the lungs every 4 (four) hours as needed for wheezing or shortness of breath. 02/10/20   Margarita Mail, PA-C  blood glucose meter kit and supplies Dispense based on patient and insurance preference. Use up to four  times daily as directed. (FOR ICD-10 E10.9, E11.9). 05/13/19   Azzie Glatter, FNP  Budesonide (PULMICORT FLEXHALER) 90 MCG/ACT inhaler Inhale 1 puff into the lungs 2 (two) times daily. Rinse mouth with water and spit after every use. Patient taking differently: Inhale 1 puff into the lungs daily as needed (for shortness of breath). Rinse mouth with water and spit after every use. 10/13/20   Burky, Malachy Moan, NP  levETIRAcetam (KEPPRA) 1000 MG tablet Take 1 tablet (1,000 mg total) by mouth 2 (two) times daily. 11/06/21   Larene Pickett, PA-C  lidocaine (LIDODERM) 5 % Place 1 patch onto the skin daily. Remove & Discard patch within 12 hours or as directed by MD 09/23/21   Malvin Johns, MD  naproxen (NAPROSYN) 500 MG tablet Take 1 tablet (500 mg total) by mouth 2 (two) times daily. 12/05/20   Henderly, Britni A, PA-C      Allergies    Ibuprofen, Tramadol, and Acetaminophen    Review of Systems   Review of Systems  All other systems reviewed and are negative.  Physical Exam Updated Vital Signs BP (!) 150/102   Pulse (!) 102   Resp 14   Ht _0  (1.753 m)   Wt 74.8 kg   SpO2 100%   BMI 24.35 kg/m  Physical Exam Vitals and nursing note reviewed.  Constitutional:      General: She is not in acute distress.    Appearance: Normal appearance. She is well-developed.     Comments: Appears intoxicated,  slow to respond to questioning, reports recent alcohol consumption  HENT:     Head: Normocephalic and atraumatic.     Comments: No laceration noted to tongue. Eyes:     Conjunctiva/sclera: Conjunctivae normal.     Pupils: Pupils are equal, round, and reactive to light.  Cardiovascular:     Rate and Rhythm: Normal rate and regular rhythm.     Heart sounds: Normal heart sounds.  Pulmonary:     Effort: Pulmonary effort is normal. No respiratory distress.     Breath sounds: Normal breath sounds.  Abdominal:     General: There is no distension.     Palpations: Abdomen is soft.      Tenderness: There is no abdominal tenderness.  Genitourinary:    Comments: No evidence of apparent urinary incontinence Musculoskeletal:        General: No deformity. Normal range of motion.     Cervical back: Normal range of motion and neck supple.  Skin:    General: Skin is warm and dry.  Neurological:     General: No focal deficit present.     Mental Status: She is alert and oriented to person, place, and time.    ED Results / Procedures / Treatments   Labs (all labs ordered are listed, but only abnormal results are displayed) Labs Reviewed  ETHANOL - Abnormal; Notable for the following components:      Result Value   Alcohol, Ethyl (B) 122 (*)    All other components within normal limits  COMPREHENSIVE METABOLIC PANEL - Abnormal; Notable for the following components:   Total Protein 8.9 (*)    All other components within normal limits  I-STAT CHEM 8, ED - Abnormal; Notable for the following components:   Calcium, Ion 0.98 (*)    All other components within normal limits  CBC WITH DIFFERENTIAL/PLATELET  RAPID URINE DRUG SCREEN, HOSP PERFORMED  URINALYSIS, ROUTINE W REFLEX MICROSCOPIC  I-STAT BETA HCG BLOOD, ED ([facility], [facility], [facility] ONLY)  CBG MONITORING, ED    EKG EKG Interpretation  Date/Time:  Saturday Feb 18 2022 16:25:59 EDT Ventricular Rate:  100 PR Interval:  128 QRS Duration: 80 QT Interval:  468 QTC Calculation: 604 R Axis:   79 Text Interpretation: Sinus tachycardia Biatrial enlargement LVH with secondary repolarization abnormality Prolonged QT interval Confirmed by Dene Gentry [id number] on 02/18/2022 4:51:10 PM  Radiology CT Head Wo Contrast  Result Date: 02/18/2022 CLINICAL DATA:  Altered mental status and neck trauma. EXAM: CT HEAD WITHOUT CONTRAST CT CERVICAL SPINE WITHOUT CONTRAST TECHNIQUE: Multidetector CT imaging of the head and cervical spine was performed following the standard protocol without intravenous contrast. Multiplanar CT image reconstructions of  the cervical spine were also generated. RADIATION DOSE REDUCTION: This exam was performed according to the departmental dose-optimization program which includes automated exposure control, adjustment of the mA and/or kV according to patient size and/or use of iterative reconstruction technique. COMPARISON:  CT head dated 01/21/2022 and CT cervical spine dated 09/13/2021. FINDINGS: CT HEAD FINDINGS Brain: No evidence of acute infarction, hemorrhage, hydrocephalus, extra-axial collection or mass lesion/mass effect. Vascular: No hyperdense vessel or unexpected calcification. Skull: Normal. Negative for fracture or focal lesion. Sinuses/Orbits: No acute finding. Other: None. CT CERVICAL SPINE FINDINGS Alignment: Normal. Skull base and vertebrae: No acute fracture. No primary bone lesion or focal pathologic process. Soft tissues and spinal canal: No prevertebral fluid or swelling. No visible canal hematoma. Disc levels:  Multilevel degenerative disc and joint disease. Upper chest: Negative. Other: None.  IMPRESSION: 1. No acute intracranial process. 2. No acute osseous injury in the cervical spine. Electronically Signed   By: Zerita Boers M.D.   On: 02/18/2022 17:30   CT Cervical Spine Wo Contrast  Result Date: 02/18/2022 CLINICAL DATA:  Altered mental status and neck trauma. EXAM: CT HEAD WITHOUT CONTRAST CT CERVICAL SPINE WITHOUT CONTRAST TECHNIQUE: Multidetector CT imaging of the head and cervical spine was performed following the standard protocol without intravenous contrast. Multiplanar CT image reconstructions of the cervical spine were also generated. RADIATION DOSE REDUCTION: This exam was performed according to the departmental dose-optimization program which includes automated exposure control, adjustment of the mA and/or kV according to patient size and/or use of iterative reconstruction technique. COMPARISON:  CT head dated 01/21/2022 and CT cervical spine dated 09/13/2021. FINDINGS: CT HEAD FINDINGS  Brain: No evidence of acute infarction, hemorrhage, hydrocephalus, extra-axial collection or mass lesion/mass effect. Vascular: No hyperdense vessel or unexpected calcification. Skull: Normal. Negative for fracture or focal lesion. Sinuses/Orbits: No acute finding. Other: None. CT CERVICAL SPINE FINDINGS Alignment: Normal. Skull base and vertebrae: No acute fracture. No primary bone lesion or focal pathologic process. Soft tissues and spinal canal: No prevertebral fluid or swelling. No visible canal hematoma. Disc levels:  Multilevel degenerative disc and joint disease. Upper chest: Negative. Other: None. IMPRESSION: 1. No acute intracranial process. 2. No acute osseous injury in the cervical spine. Electronically Signed   By: Zerita Boers M.D.   On: 02/18/2022 17:30    Procedures Procedures    Medications Ordered in ED Medications  levETIRAcetam (KEPPRA) IVPB 1500 mg/ 100 mL premix (1,500 mg Intravenous New Bag/Given 02/18/22 1651)  LORazepam (ATIVAN) injection 1 mg (1 mg Intravenous Given 02/18/22 1636)    ED Course/ Medical Decision Making/ A&P                           Medical Decision Making Amount and/or Complexity of Data Reviewed Labs: ordered. Radiology: ordered.  Risk Prescription drug management.    Medical Screen Complete  This patient presented to the ED with complaint of suspected alcohol intoxication, possible seizure-like activity.  This complaint involves an extensive number of treatment options. The initial differential diagnosis includes, but is not limited to, intoxication, metabolic abnormality, seizure, etc.  This presentation is: Acute, Chronic, Self-Limited, Previously Undiagnosed, Uncertain Prognosis, Complicated, Systemic Symptoms, and Threat to Life/Bodily Function  Patient presents to the ED after apparently driving herself to the ED.  She was able to exit her vehicle in the parking lot but then either collapsed or fell to the ground next to her vehicle.   Bystanders reported possible seizure-like activity.  Patient admits to drinking alcohol prior to coming to the ED.  Patient's exam is consistent with alcohol intoxication upon initial evaluation.  No witnessed seizure activity here in the ED itself.  Keppra load administered -patient reports that she has not taken Keppra in several days.  Labs obtained reveal evidence of alcohol intoxication.  Other screening labs are without significant abnormality.  Imaging is without significant abnormality.  Patient without evidence of significant traumatic injury.  After period of observation the patient is sober.  She appears appropriate for discharge home.  She understands need for close outpatient follow-up.  She reports that she has sufficient Keppra at home.  She understands need to take Keppra to help prevent seizure activity in the future.     Co morbidities that complicated the patient's evaluation  History of polysubstance  abuse, history of seizure   Additional history obtained:  External records from outside sources obtained and reviewed including prior ED visits and prior Inpatient records.    Lab Tests:  I ordered and personally interpreted labs.  The pertinent results include: CBC, ethanol, CMP, hCG, i-STAT Chem-8, UA   Imaging Studies ordered:  I ordered imaging studies including CT head CT C-spine I independently visualized and interpreted obtained imaging which showed NAD I agree with the radiologist interpretation.   Cardiac Monitoring:  The patient was maintained on a cardiac monitor.  I personally viewed and interpreted the cardiac monitor which showed an underlying rhythm of: NSR   Medicines ordered:  I ordered medication including Ativan, Keppra load for possible seizure activity Reevaluation of the patient after these medicines showed that the patient: improved    Problem List / ED Course:  Alcohol intoxication   Reevaluation:  After the  interventions noted above, I reevaluated the patient and found that they have: improved  Disposition:  After consideration of the diagnostic results and the patients response to treatment, I feel that the patent would benefit from close outpatient follow-up.          Final Clinical Impression(s) / ED Diagnoses Final diagnoses:  Alcoholic intoxication without complication [facility])    Rx / DC Orders ED Discharge Orders     None         Valarie Merino, MD 02/18/22 2035

## 2022-02-18 NOTE — ED Notes (Signed)
Pt alert at this time, saying she wants to go home. EDP at bedside. Pt reports drinking etoh.

## 2022-02-19 LAB — RAPID URINE DRUG SCREEN, HOSP PERFORMED
Amphetamines: NOT DETECTED
Barbiturates: NOT DETECTED
Benzodiazepines: NOT DETECTED
Cocaine: POSITIVE — AB
Opiates: NOT DETECTED
Tetrahydrocannabinol: NOT DETECTED

## 2022-02-19 LAB — URINALYSIS, ROUTINE W REFLEX MICROSCOPIC
Bilirubin Urine: NEGATIVE
Glucose, UA: NEGATIVE mg/dL
Ketones, ur: NEGATIVE mg/dL
Leukocytes,Ua: NEGATIVE
Nitrite: NEGATIVE
Protein, ur: NEGATIVE mg/dL
Specific Gravity, Urine: 1.01 (ref 1.005–1.030)
pH: 5 (ref 5.0–8.0)

## 2022-02-19 NOTE — ED Notes (Signed)
Discharge the patient to lobby per charge nurse. Patient is alerts and oriented x4 and ambulatory to lobby with steady gait at the time of discharge.

## 2022-02-19 NOTE — ED Notes (Addendum)
Error

## 2022-06-11 ENCOUNTER — Emergency Department (HOSPITAL_COMMUNITY): Payer: 59

## 2022-06-11 ENCOUNTER — Observation Stay (HOSPITAL_COMMUNITY)
Admission: EM | Admit: 2022-06-11 | Discharge: 2022-06-13 | Disposition: A | Discharge status: Home / Self Care | Payer: 59 | Attending: Family Medicine | Admitting: Family Medicine

## 2022-06-11 ENCOUNTER — Observation Stay (HOSPITAL_COMMUNITY): Payer: 59

## 2022-06-11 DIAGNOSIS — R519 Headache, unspecified: Secondary | ICD-10-CM | POA: Insufficient documentation

## 2022-06-11 DIAGNOSIS — G40909 Epilepsy, unspecified, not intractable, without status epilepticus: Secondary | ICD-10-CM | POA: Diagnosis present

## 2022-06-11 DIAGNOSIS — Z79899 Other long term (current) drug therapy: Secondary | ICD-10-CM | POA: Insufficient documentation

## 2022-06-11 DIAGNOSIS — G9341 Metabolic encephalopathy: Secondary | ICD-10-CM | POA: Diagnosis not present

## 2022-06-11 DIAGNOSIS — E119 Type 2 diabetes mellitus without complications: Secondary | ICD-10-CM | POA: Insufficient documentation

## 2022-06-11 DIAGNOSIS — F1010 Alcohol abuse, uncomplicated: Secondary | ICD-10-CM

## 2022-06-11 DIAGNOSIS — J449 Chronic obstructive pulmonary disease, unspecified: Secondary | ICD-10-CM | POA: Diagnosis not present

## 2022-06-11 DIAGNOSIS — R569 Unspecified convulsions: Secondary | ICD-10-CM | POA: Diagnosis not present

## 2022-06-11 DIAGNOSIS — F32A Depression, unspecified: Secondary | ICD-10-CM | POA: Diagnosis present

## 2022-06-11 DIAGNOSIS — F1414 Cocaine abuse with cocaine-induced mood disorder: Secondary | ICD-10-CM | POA: Diagnosis present

## 2022-06-11 DIAGNOSIS — J45909 Unspecified asthma, uncomplicated: Secondary | ICD-10-CM | POA: Diagnosis not present

## 2022-06-11 DIAGNOSIS — F1020 Alcohol dependence, uncomplicated: Secondary | ICD-10-CM | POA: Diagnosis present

## 2022-06-11 LAB — CBC WITH DIFFERENTIAL/PLATELET
Abs Immature Granulocytes: 0.01 10*3/uL (ref 0.00–0.07)
Basophils Absolute: 0 10*3/uL (ref 0.0–0.1)
Basophils Relative: 1 %
Eosinophils Absolute: 0 10*3/uL (ref 0.0–0.5)
Eosinophils Relative: 1 %
HCT: 35.9 % — ABNORMAL LOW (ref 36.0–46.0)
Hemoglobin: 11.7 g/dL — ABNORMAL LOW (ref 12.0–15.0)
Immature Granulocytes: 0 %
Lymphocytes Relative: 46 %
Lymphs Abs: 2.1 10*3/uL (ref 0.7–4.0)
MCH: 30.8 pg (ref 26.0–34.0)
MCHC: 32.6 g/dL (ref 30.0–36.0)
MCV: 94.5 fL (ref 80.0–100.0)
Monocytes Absolute: 0.3 10*3/uL (ref 0.1–1.0)
Monocytes Relative: 6 %
Neutro Abs: 2 10*3/uL (ref 1.7–7.7)
Neutrophils Relative %: 46 %
Platelets: 196 10*3/uL (ref 150–400)
RBC: 3.8 MIL/uL — ABNORMAL LOW (ref 3.87–5.11)
RDW: 13.2 % (ref 11.5–15.5)
WBC: 4.4 10*3/uL (ref 4.0–10.5)
nRBC: 0 % (ref 0.0–0.2)

## 2022-06-11 LAB — RAPID URINE DRUG SCREEN, HOSP PERFORMED
Amphetamines: NOT DETECTED
Barbiturates: NOT DETECTED
Benzodiazepines: POSITIVE — AB
Cocaine: POSITIVE — AB
Opiates: NOT DETECTED
Tetrahydrocannabinol: NOT DETECTED

## 2022-06-11 LAB — URINALYSIS, ROUTINE W REFLEX MICROSCOPIC
Bilirubin Urine: NEGATIVE
Glucose, UA: NEGATIVE mg/dL
Ketones, ur: NEGATIVE mg/dL
Leukocytes,Ua: NEGATIVE
Nitrite: NEGATIVE
Protein, ur: NEGATIVE mg/dL
Specific Gravity, Urine: 1.013 (ref 1.005–1.030)
pH: 5 (ref 5.0–8.0)

## 2022-06-11 LAB — COMPREHENSIVE METABOLIC PANEL
ALT: 20 U/L (ref 0–44)
AST: 28 U/L (ref 15–41)
Albumin: 4.1 g/dL (ref 3.5–5.0)
Alkaline Phosphatase: 60 U/L (ref 38–126)
Anion gap: 9 (ref 5–15)
BUN: 10 mg/dL (ref 6–20)
CO2: 22 mmol/L (ref 22–32)
Calcium: 9.1 mg/dL (ref 8.9–10.3)
Chloride: 107 mmol/L (ref 98–111)
Creatinine, Ser: 1.11 mg/dL — ABNORMAL HIGH (ref 0.44–1.00)
GFR, Estimated: >60 mL/min (ref >60)
Glucose, Bld: 93 mg/dL (ref 70–99)
Potassium: 3.3 mmol/L — ABNORMAL LOW (ref 3.5–5.1)
Sodium: 138 mmol/L (ref 135–145)
Total Bilirubin: 0.3 mg/dL (ref 0.3–1.2)
Total Protein: 7.6 g/dL (ref 6.5–8.1)

## 2022-06-11 LAB — CK: Total CK: 489 U/L — ABNORMAL HIGH (ref 38–234)

## 2022-06-11 LAB — CBG MONITORING, ED: Glucose-Capillary: 81 mg/dL (ref 70–99)

## 2022-06-11 LAB — ETHANOL: Alcohol, Ethyl (B): 155 mg/dL — ABNORMAL HIGH (ref <10)

## 2022-06-11 LAB — I-STAT BETA HCG BLOOD, ED (MC, WL, AP ONLY): I-stat hCG, quantitative: <5 m[IU]/mL (ref <5)

## 2022-06-11 LAB — SALICYLATE LEVEL: Salicylate Lvl: <7 mg/dL — ABNORMAL LOW (ref 7.0–30.0)

## 2022-06-11 LAB — ACETAMINOPHEN LEVEL: Acetaminophen (Tylenol), Serum: <10 ug/mL — ABNORMAL LOW (ref 10–30)

## 2022-06-11 MED ORDER — ALBUTEROL SULFATE HFA 108 (90 BASE) MCG/ACT IN AERS: 2.0000 | INHALATION_SPRAY | RESPIRATORY_TRACT | Status: DC | PRN

## 2022-06-11 MED ORDER — BUDESONIDE 90 MCG/ACT IN AEPB
INHALATION_SPRAY | Freq: Two times a day (BID) | RESPIRATORY_TRACT | Status: DC
Start: 2022-06-11 — End: 2022-06-11

## 2022-06-11 MED ORDER — LEVETIRACETAM 500 MG PO TABS
1000.0000 mg | ORAL_TABLET | Freq: Two times a day (BID) | ORAL | Status: DC
  Administered 2022-06-11 – 2022-06-13 (×4): 1000 mg via ORAL
  Filled 2022-06-11 (×4): qty 2

## 2022-06-11 MED ORDER — METOCLOPRAMIDE HCL 5 MG/ML IJ SOLN
10.0000 mg | Freq: Once | INTRAMUSCULAR | Status: AC
  Administered 2022-06-11: 10 mg via INTRAVENOUS
  Filled 2022-06-11: qty 2

## 2022-06-11 MED ORDER — FOLIC ACID 1 MG PO TABS
1.0000 mg | ORAL_TABLET | Freq: Every day | ORAL | Status: DC
  Administered 2022-06-11 – 2022-06-13 (×3): 1 mg via ORAL
  Filled 2022-06-11 (×3): qty 1

## 2022-06-11 MED ORDER — THIAMINE MONONITRATE 100 MG PO TABS
100.0000 mg | ORAL_TABLET | Freq: Every day | ORAL | Status: DC
  Administered 2022-06-12 – 2022-06-13 (×2): 100 mg via ORAL
  Filled 2022-06-11 (×3): qty 1

## 2022-06-11 MED ORDER — BUDESONIDE 0.25 MG/2ML IN SUSP
0.2500 mg | Freq: Two times a day (BID) | RESPIRATORY_TRACT | Status: DC
  Administered 2022-06-12 – 2022-06-13 (×3): 0.25 mg via RESPIRATORY_TRACT
  Filled 2022-06-11 (×3): qty 2

## 2022-06-11 MED ORDER — ALBUTEROL SULFATE (2.5 MG/3ML) 0.083% IN NEBU
2.5000 mg | INHALATION_SOLUTION | Freq: Four times a day (QID) | RESPIRATORY_TRACT | Status: DC | PRN
Start: 2022-06-11 — End: 2022-06-11

## 2022-06-11 MED ORDER — THIAMINE HCL 100 MG/ML IJ SOLN
100.0000 mg | Freq: Every day | INTRAMUSCULAR | Status: DC
  Administered 2022-06-11: 100 mg via INTRAVENOUS
  Filled 2022-06-11: qty 2

## 2022-06-11 MED ORDER — SODIUM CHLORIDE 0.9 % IV SOLN
75.0000 mL/h | INTRAVENOUS | Status: DC
  Administered 2022-06-11: 75 mL/h via INTRAVENOUS

## 2022-06-11 MED ORDER — NALTREXONE HCL 50 MG PO TABS: 25.0000 mg | ORAL_TABLET | Freq: Every day | ORAL | Status: DC

## 2022-06-11 MED ORDER — PHENOBARBITAL 32.4 MG PO TABS
32.4000 mg | ORAL_TABLET | Freq: Two times a day (BID) | ORAL | Status: DC
  Administered 2022-06-11 – 2022-06-13 (×4): 32.4 mg via ORAL
  Filled 2022-06-11 (×5): qty 1

## 2022-06-11 MED ORDER — ORAL CARE MOUTH RINSE
15.0000 mL | OROMUCOSAL | Status: DC | PRN
Start: 2022-06-11 — End: 2022-06-13

## 2022-06-11 MED ORDER — LORAZEPAM 2 MG/ML IJ SOLN: 1.0000 mg | INTRAMUSCULAR | Status: DC | PRN

## 2022-06-11 MED ORDER — LORAZEPAM 1 MG PO TABS: 1.0000 mg | ORAL_TABLET | ORAL | Status: DC | PRN

## 2022-06-11 MED ORDER — POTASSIUM CHLORIDE CRYS ER 20 MEQ PO TBCR
40.0000 meq | EXTENDED_RELEASE_TABLET | Freq: Once | ORAL | Status: AC
  Administered 2022-06-11: 40 meq via ORAL
  Filled 2022-06-11: qty 2

## 2022-06-11 MED ORDER — ORAL CARE MOUTH RINSE
15.0000 mL | OROMUCOSAL | Status: DC
  Administered 2022-06-12: 15 mL via OROMUCOSAL

## 2022-06-11 MED ORDER — GADOBUTROL 1 MMOL/ML IV SOLN
7.5000 mL | Freq: Once | INTRAVENOUS | Status: AC | PRN
Start: 2022-06-11 — End: 2022-06-11
  Administered 2022-06-11: 7.5 mL via INTRAVENOUS

## 2022-06-11 MED ORDER — CYCLOBENZAPRINE HCL 5 MG PO TABS
10.0000 mg | ORAL_TABLET | Freq: Three times a day (TID) | ORAL | Status: DC
  Administered 2022-06-11 – 2022-06-12 (×5): 10 mg via ORAL
  Filled 2022-06-11 (×2): qty 2
  Filled 2022-06-11: qty 1
  Filled 2022-06-11 (×3): qty 2

## 2022-06-11 MED ORDER — ADULT MULTIVITAMIN W/MINERALS CH
1.0000 | ORAL_TABLET | Freq: Every day | ORAL | Status: DC
  Administered 2022-06-11 – 2022-06-13 (×3): 1 via ORAL
  Filled 2022-06-11 (×3): qty 1

## 2022-06-11 MED ORDER — LEVETIRACETAM IN NACL 1000 MG/100ML IV SOLN
1000.0000 mg | Freq: Once | INTRAVENOUS | Status: AC
  Administered 2022-06-11: 1000 mg via INTRAVENOUS
  Filled 2022-06-11: qty 100

## 2022-06-11 MED ORDER — ALBUTEROL SULFATE (2.5 MG/3ML) 0.083% IN NEBU: 2.5000 mg | INHALATION_SOLUTION | RESPIRATORY_TRACT | Status: DC | PRN

## 2022-06-11 MED ORDER — ENOXAPARIN SODIUM 40 MG/0.4ML IJ SOSY
40.0000 mg | PREFILLED_SYRINGE | INTRAMUSCULAR | Status: DC
  Administered 2022-06-11 – 2022-06-12 (×2): 40 mg via SUBCUTANEOUS
  Filled 2022-06-11 (×2): qty 0.4

## 2022-06-11 MED ORDER — POTASSIUM CHLORIDE 10 MEQ/100ML IV SOLN
10.0000 meq | Freq: Once | INTRAVENOUS | Status: AC
  Administered 2022-06-11: 10 meq via INTRAVENOUS
  Filled 2022-06-11: qty 100

## 2022-06-11 MED ORDER — SODIUM CHLORIDE 0.9 % IV BOLUS
1000.0000 mL | Freq: Once | INTRAVENOUS | Status: AC
Start: 2022-06-11 — End: 2022-06-11
  Administered 2022-06-11: 1000 mL via INTRAVENOUS

## 2022-06-11 NOTE — ED Provider Notes (Signed)
[facility] EMERGENCY DEPARTMENT Provider Note   CSN: [id number] Arrival date & time: 06/11/22  2202     History  Chief Complaint  Patient presents with   Seizures  Level 5 caveat due to altered mental status  Jennifer Crawford is a 46 y.o. female.  The history is provided by the EMS personnel. The history is limited by the condition of the patient.  Seizures  Patient presents via EMS.  Patient has a known history of seizures.  EMS reports they picked her up from the [place] [facility].  Family members called because she had a generalized seizure.  EMS.  Patient given 15mVersed IM, however she had another generalized seizure afterwards.  She was then given 2.5 of Versed with improvement. Glucose was appropriate for EMS. It is reported patient is compliant with meds  Home Medications Prior to Admission medications   Medication Sig Start Date End Date Taking? Authorizing Provider  albuterol (PROVENTIL) (2.5 MG/3ML) 0.083% nebulizer solution Take 3 mLs (2.5 mg total) by nebulization every 6 (six) hours as needed for wheezing or shortness of breath. 04/04/20   STruddie Hidden MD  albuterol (VENTOLIN HFA) 108 (90 Base) MCG/ACT inhaler Inhale 2 puffs into the lungs every 4 (four) hours as needed for wheezing or shortness of breath. 02/10/20   HMargarita Mail PA-C  blood glucose meter kit and supplies Dispense based on patient and insurance preference. Use up to four times daily as directed. (FOR ICD-10 E10.9, E11.9). 05/13/19   SAzzie Glatter FNP  Budesonide (PULMICORT FLEXHALER) 90 MCG/ACT inhaler Inhale 1 puff into the lungs 2 (two) times daily. Rinse mouth with water and spit after every use. Patient taking differently: Inhale 1 puff into the lungs daily as needed (for shortness of breath). Rinse mouth with water and spit after every use. 10/13/20   Burky, NMalachy Moan NP  levETIRAcetam (KEPPRA) 1000 MG tablet Take 1 tablet (1,000 mg total) by mouth 2 (two) times daily.  11/06/21   SLarene Pickett PA-C  levETIRAcetam (KEPPRA) 1000 MG tablet Take 1 tablet (1,000 mg total) by mouth 2 (two) times daily. 02/18/22   MValarie Merino MD  lidocaine (LIDODERM) 5 % Place 1 patch onto the skin daily. Remove & Discard patch within 12 hours or as directed by MD 09/23/21   BMalvin Johns MD  naproxen (NAPROSYN) 500 MG tablet Take 1 tablet (500 mg total) by mouth 2 (two) times daily. 12/05/20   Henderly, Britni A, PA-C      Allergies    Ibuprofen, Tramadol, and Acetaminophen    Review of Systems   Review of Systems  Unable to perform ROS: Mental status change  Neurological:  Positive for seizures.    Physical Exam Updated Vital Signs BP 124/78   Pulse 70   Temp 98.1 F (36.7 C) (Axillary)   Resp 12   SpO2 100%  Physical Exam CONSTITUTIONAL: Disheveled, somnolent HEAD: Normocephalic/atraumatic, no visible trauma EYES: Pupils pinpoint bilaterally.  No nystagmus ENMT: Mucous membranes moist, NPA in left nare NECK: supple no meningeal signs CV: S1/S2 noted, no murmurs/rubs/gallops noted LUNGS: Lungs are clear to auscultation bilaterally, no apparent distress ABDOMEN: soft, nondistended NEURO: Pt is somnolent with sonorous respirations EXTREMITIES: pulses normal/equal, no visible trauma SKIN: warm, color normal PSYCH: Unable to assess ED Results / Procedures / Treatments   Labs (all labs ordered are listed, but only abnormal results are displayed) Labs Reviewed  COMPREHENSIVE METABOLIC PANEL - Abnormal; Notable for the following components:  Result Value   Potassium 3.3 (*)    Creatinine, Ser 1.11 (*)    All other components within normal limits  CBC WITH DIFFERENTIAL/PLATELET - Abnormal; Notable for the following components:   RBC 3.80 (*)    Hemoglobin 11.7 (*)    HCT 35.9 (*)    All other components within normal limits  ETHANOL - Abnormal; Notable for the following components:   Alcohol, Ethyl (B) 155 (*)    All other components within normal  limits  ACETAMINOPHEN LEVEL - Abnormal; Notable for the following components:   Acetaminophen (Tylenol), Serum <10 (*)    All other components within normal limits  SALICYLATE LEVEL - Abnormal; Notable for the following components:   Salicylate Lvl <0.9 (*)    All other components within normal limits  RAPID URINE DRUG SCREEN, HOSP PERFORMED  CBG MONITORING, ED  I-STAT BETA HCG BLOOD, ED ([facility], [facility], [facility] ONLY)    EKG EKG Interpretation  Date/Time:  Sunday June 11 2022 02:24:26 EDT Ventricular Rate:  85 PR Interval:  189 QRS Duration: 89 QT Interval:  399 QTC Calculation: 475 R Axis:   70 Text Interpretation: Sinus rhythm LAE, consider biatrial enlargement Left ventricular hypertrophy No significant change since last tracing Confirmed by Ripley Fraise [phone]) on 06/11/2022 2:27:18 AM  Radiology CT Head Wo Contrast  Result Date: 06/11/2022 CLINICAL DATA:  Seizure EXAM: CT HEAD WITHOUT CONTRAST TECHNIQUE: Contiguous axial images were obtained from the base of the skull through the vertex without intravenous contrast. RADIATION DOSE REDUCTION: This exam was performed according to the departmental dose-optimization program which includes automated exposure control, adjustment of the mA and/or kV according to patient size and/or use of iterative reconstruction technique. COMPARISON:  02/18/2022 FINDINGS: Brain: No evidence of acute infarction, hemorrhage, hydrocephalus, extra-axial collection or mass lesion/mass effect. Vascular: No hyperdense vessel or unexpected calcification. Skull: Normal. Negative for fracture or focal lesion. Sinuses/Orbits: The visualized paranasal sinuses are essentially clear. The mastoid air cells are unopacified. Other: None. IMPRESSION: Normal head CT. Electronically Signed   By: Julian Hy M.D.   On: 06/11/2022 03:44    Procedures Procedures    Medications Ordered in ED Medications  levETIRAcetam (KEPPRA) IVPB 1000 mg/100 mL premix (0 mg Intravenous  Stopped 06/11/22 0424)    ED Course/ Medical Decision Making/ A&P Clinical Course as of 06/11/22 0657  Sun Jun 11, 2022  0238 Patient seen on arrival after she had multiple seizures that required Versed.  Patient is now somnolent, protecting her airway and her vitals are appropriate.  Imaging and labs have been ordered.  Reviewed care everywhere records [DW]  0302 Son and boyfriend are at bedside.  They report patient has been under stress and recently lost her father.  However she has been compliant with her medications. [DW]  7322 Alcohol, Ethyl (B)(!): 155 Labs consistent with alcohol intoxication [DW]  0511 Patient resting comfortably, now more arousable but still sleeping.  CT head reviewed and negative.  We will continue to monitor [DW]  [phone] Patient still resting comfortably, but still not awake enough to ambulate we will continue to monitor [DW]  0656 D/w Dr. Ernesto Rutherford, will reassess patient and if improved she can be discharged [DW]    Clinical Course User Index [DW] Ripley Fraise, MD                           Medical Decision Making Amount and/or Complexity of Data Reviewed Labs: ordered. Decision-making details  documented in ED Course. Radiology: ordered.  Risk Prescription drug management.   This patient presents to the ED for concern of seizure, this involves an extensive number of treatment options, and is a complaint that carries with it a high risk of complications and morbidity.  The differential diagnosis includes but is not limited to acute seizure, status epilepticus, encephalitis, drug toxicity  Comorbidities that complicate the patient evaluation: Patient's presentation is complicated by their history of seizure disorder  Social Determinants of Health: Patient's  substance use disorder   increases the complexity of managing their presentation  Additional history obtained: Additional history obtained from EMS discussed with EMS at bedside Records reviewed  Care Everywhere/External Records  Lab Tests: I Ordered, and personally interpreted labs.  The pertinent results include: Alcohol intoxication  Imaging Studies ordered: I ordered imaging studies including CT scan head   I independently visualized and interpreted imaging which showed no acute finding I agree with the radiologist interpretation  Cardiac Monitoring: The patient was maintained on a cardiac monitor.  I personally viewed and interpreted the cardiac monitor which showed an underlying rhythm of:  sinus rhythm  Medicines ordered and prescription drug management: I ordered medication including Keppra for seizures Reevaluation of the patient after these medicines showed that the patient    stayed the same  Critical Interventions:  IV Keppra   Reevaluation: After the interventions noted above, I reevaluated the patient and found that they have :improved  Complexity of problems addressed: Patient's presentation is most consistent with  acute presentation with potential threat to life or bodily function           Final Clinical Impression(s) / ED Diagnoses Final diagnoses:  Seizure [facility])    Rx / [place] Orders ED Discharge Orders     None         Ripley Fraise, MD 06/11/22 [phone]

## 2022-06-11 NOTE — Consult Note (Addendum)
Neurology Consultation  Reason for Consult: Seizures Referring Physician: Ernesto Rutherford  CC: Seizures  History is obtained from:Patient, chart review  HPI: Jennifer Crawford is a 46 y.o. female with a past medical history of anxiety, depression, alcohol abuse, polysubstance abuse on naltrexone, diabetes myelitis, vitamin D deficiency, and seizures.  Patient presents today after being picked up by EMS from [facility].  A family member called EMS after the patient had a reported tonic clonic seizure.  She does have a history of seizures and has been seen recently in the ED for this as well.  She was given 5 mg of IM Versed then seized again and was given another 2.5 mg of Versed IV push.  EMS was able to place a NPA.  She does take Keppra 1000 mg twice daily.  Upon arrival alcohol level was 155.  Tylenol and aspirin levels are negative.  CT head and C-spine show no acute abnormality. She states that she does not remember having a seizure yesterday and she has been taking her medication, including her keppra as prescribed. She does endorse frequent alcohol use. UDS is still pending.   She has had 1 outpatient neurology visit which is accessible in our EMR.  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  She endorses drinking "a little bit".  She reports her seizure onset as a severe headache with tearing of her left eye that does not respond to headache medication as well as blurry vision.  She denies any other substance use (but this was asked with the boyfriend in the room).  She endorses significant psychosocial stressors, including being between places right now and having missed 2 doses of her Keppra in this setting   ROS: Full ROS was performed and is negative except as noted in the HPI.   Past  Medical History:  Diagnosis Date   Anxiety and depression    Diabetes mellitus without complication ([place])    Right flank pain 05/2019   Right upper quadrant pain 8/22020   Seizures ([place]) 2015   Vitamin D deficiency 05/2019    Family History  Problem Relation Age of Onset   Seizures Daughter    Asthma Son    Hypertension Father    Arrhythmia Father        Status post pacemaker insertion   Hypertension Mother    Diabetes Mother     Social History:   reports that she has never smoked. She has never used smokeless tobacco. She reports current alcohol use. She reports that she does not use drugs.  Medications  Current Facility-Administered Medications:    potassium chloride 10 mEq in 100 mL IVPB, 10 mEq, Intravenous, Once, Kneller, [place] K, DO   potassium chloride SA (KLOR-CON M) CR tablet 40 mEq, 40 mEq, Oral, Once, Kneller, Victoria K, DO  Current Outpatient Medications:    albuterol (PROVENTIL) (2.5 MG/3ML) 0.083% nebulizer solution, Take 3 mLs (2.5 mg total) by nebulization every 6 (six) hours as needed for wheezing or shortness of breath., Disp: 75 mL, Rfl: 0   albuterol (VENTOLIN HFA) 108 (90 Base) MCG/ACT inhaler, Inhale 2 puffs into the lungs every 4 (four) hours as needed for wheezing or shortness of breath., Disp: 18 g, Rfl: 1   blood glucose meter kit and supplies, Dispense based on  patient and insurance preference. Use up to four times daily as directed. (FOR ICD-10 E10.9, E11.9)., Disp: 1 each, Rfl: 0   Budesonide (PULMICORT FLEXHALER) 90 MCG/ACT inhaler, Inhale 1 puff into the lungs 2 (two) times daily. Rinse mouth with water and spit after every use. (Patient taking differently: Inhale 1 puff into the lungs daily as needed (for shortness of breath). Rinse mouth with water and spit after every use.), Disp: 1 each, Rfl: 0   levETIRAcetam (KEPPRA) 1000 MG tablet, Take 1 tablet (1,000 mg total) by mouth 2 (two) times daily., Disp: 60 tablet, Rfl: 0   levETIRAcetam  (KEPPRA) 1000 MG tablet, Take 1 tablet (1,000 mg total) by mouth 2 (two) times daily., Disp: 60 tablet, Rfl: 0   lidocaine (LIDODERM) 5 %, Place 1 patch onto the skin daily. Remove & Discard patch within 12 hours or as directed by MD, Disp: 30 patch, Rfl: 0   naproxen (NAPROSYN) 500 MG tablet, Take 1 tablet (500 mg total) by mouth 2 (two) times daily., Disp: 30 tablet, Rfl: 0  Exam: Current vital signs: BP 103/65   Pulse 69   Temp 97.7 F (36.5 C)   Resp 13   SpO2 100%  Vital signs in last 24 hours: Temp:  [97.7 F (36.5 C)-98.9 F (37.2 C)] 97.7 F (36.5 C) (09/10 1130) Pulse Rate:  [68-84] 69 (09/10 1300) Resp:  [10-19] 13 (09/10 1300) BP: (98-132)/(65-89) 103/65 (09/10 1300) SpO2:  [97 %-100 %] 100 % (09/10 1300)  GENERAL: awakens with physical stimulation; later attending evaluation alert and oriented HEENT: -Significant bruising on the jaw bilaterally LUNGS - Clear to auscultation bilaterally with no wheezes CV - S1S2 RRR, no m/r/g, equal pulses bilaterally. ABDOMEN - Soft, tenderness in right lower quadrant, nondistended with normoactive BS Ext: warm, well perfused, intact peripheral pulses, no edema  NEURO:  Mental Status: Drowsy, able to state name and that we are in [place].  On later attending evaluation oriented to time and place and situation Language: speech is soft, mumbling, difficult to understand.  She does follow simple commands. No aphasia or neglect Cranial Nerves: PERRL 3 mm/brisk. Gaze is dysconjugate, no facial asymmetry, facial sensation intact, hearing intact, tongue/uvula/soft palate midline, normal sternocleidomastoid and trapezius muscle strength. No evidence of tongue atrophy or fibrillations Motor: Moves all extremities antigravity.  On attending evaluation 5/5 strength throughout Tone: is normal and bulk is normal Sensation- Intact to light touch bilaterally Coordination: FTN intact bilaterally, no ataxia in BLE. Gait- deferred  Labs I have  reviewed labs in epic and the results pertinent to this consultation are:  CBC    Component Value Date/Time   WBC 4.4 06/11/2022 0245   RBC 3.80 (L) 06/11/2022 0245   HGB 11.7 (L) 06/11/2022 0245   HCT 35.9 (L) 06/11/2022 0245   PLT 196 06/11/2022 0245   MCV 94.5 06/11/2022 0245   MCH 30.8 06/11/2022 0245   MCHC 32.6 06/11/2022 0245   RDW 13.2 06/11/2022 0245   LYMPHSABS 2.1 06/11/2022 0245   MONOABS 0.3 06/11/2022 0245   EOSABS 0.0 06/11/2022 0245   BASOSABS 0.0 06/11/2022 0245    CMP     Component Value Date/Time   NA 138 06/11/2022 0245   K 3.3 (L) 06/11/2022 0245   CL 107 06/11/2022 0245   CO2 22 06/11/2022 0245   GLUCOSE 93 06/11/2022 0245   BUN 10 06/11/2022 0245   CREATININE 1.11 (H) 06/11/2022 0245   CALCIUM 9.1 06/11/2022 0245   PROT 7.6 06/11/2022 0245  PROT 7.7 05/13/2019 1358   ALBUMIN 4.1 06/11/2022 0245   ALBUMIN 4.8 05/13/2019 1358   AST 28 06/11/2022 0245   ALT 20 06/11/2022 0245   ALKPHOS 60 06/11/2022 0245   BILITOT 0.3 06/11/2022 0245   BILITOT 0.4 05/13/2019 1358   GFRNONAA >60 06/11/2022 0245   GFRAA >60 04/25/2020 0217    Lipid Panel     Component Value Date/Time   CHOL 208 (H) 05/13/2019 1358   TRIG 163 (H) 05/13/2019 1358   HDL 67 05/13/2019 1358   CHOLHDL 3.1 05/13/2019 1358   LDLCALC 108 (H) 05/13/2019 1358   UDS positive for benzodiazepines and cocaine  Imaging I have reviewed the images obtained:  CT-head-no acute abnormality  2016 MRI brain without contrast -no acute abnormality  Assessment: 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 female with a past medical history of anxiety, depression, alcohol abuse, polysubstance abuse on naltrexone, diabetes myelitis, vitamin D deficiency, and seizures.  Patient presents today after being picked up by EMS from [facility].  A family member called EMS after the patient had a reported tonic clonic seizure. She received 7.80m of versed from EMS. On exam she is drowsy but able to state her name and  follow some simple commands with repeated stimulation. She has not had a full seizure work up or an MRI w/ contrast so that and an LTM have been ordered. She has not yet returned to baseline per EDP and family at the bedside.    Fortunately on later attending evaluation at approximately 8 PM the patient is nearing baseline.  Therefore expect the LTM EEG read to be reassuring.  My brief review of the EEG is reassuring, MRI brain is reassuring.  Suspect breakthrough seizures in a patient with known epilepsy in the setting of substance use (cocaine, alcohol) as well as medication nonadherence  Recommendations: -Continue Keppra 1000 mg twice daily -Trend CK to peak given risk for rhabdomyolysis from both cocaine use as well as seizures (added onto prior labs and ordered morning draw of CK); IV fluids per primary team -Recommend alcohol and cocaine cessation (the latter should be discussed privately with the patient) -MRI brain with and without contrast negative, reviewed by attending MD -LTM EEG initiated given patient's extreme somnolence is slow to clear state concerning for ongoing seizure activity. -CIWA per primary team, appreciate use of phenobarbital to avoid withdrawal seizures -Appreciate thiamine, multivitamin, folate per primary team -Patient requesting mental health services noting she used to have an ACT team but no longer does, appreciate primary team coordination with social work -Neurology will follow-up EEG read but otherwise will be available as needed going forward, please reach out if any questions or concerns arise  [facility]ed [facility], patients with seizures are not allowed to drive until they have been seizure-free for six months Use caution when using heavy equipment or power tools. Avoid working on ladders or at heights. Take showers instead of baths. Ensure the water temperature is not too high on the home water heater. Do not go swimming alone. Do not lock  yourself in a room alone (i.e. bathroom). When caring for infants or small children, sit down when holding, feeding, or changing them to minimize risk of injury to the child in the event you have a seizure. Maintain good sleep hygiene. Avoid alcohol.  If patient has another seizure, call 911 and bring them back to the ED if: A.  The seizure lasts longer than 5 minutes.  B.  The patient doesn't wake shortly after the seizure or has new problems such as difficulty seeing, speaking or moving following the seizure C.  The patient was injured during the seizure D.  The patient has a temperature over 102 F (39C) E.  The patient vomited during the seizure and now is having trouble breathing    During the Seizure   - First, ensure adequate ventilation and place patients on the floor on their left side  Loosen clothing around the neck and ensure the airway is patent. If the patient is clenching the teeth, do not force the mouth open with any object as this can cause severe damage - Remove all items from the surrounding that can be hazardous. The patient may be oblivious to what's happening and may not even know what he or she is doing. If the patient is confused and wandering, either gently guide him/her away and block access to outside areas - Reassure the individual and be comforting - Call 911. In most cases, the seizure ends before EMS arrives. However, there are cases when seizures may last over 3 to 5 minutes. Or the individual may have developed breathing difficulties or severe injuries. If a pregnant patient or a person with diabetes develops a seizure, it is prudent to call an ambulance. - Finally, if the patient does not regain full consciousness, then call EMS. Most patients will remain confused for about 45 to 90 minutes after a seizure, so you must use judgment in calling for help.    After the Seizure (Postictal Stage)   After a seizure, most patients experience confusion, fatigue,  muscle pain and/or a headache. Thus, one should permit the individual to sleep. For the next few days, reassurance is essential. Being calm and helping reorient the person is also of importance.   Most seizures are painless and end spontaneously. Seizures are not harmful to others but can lead to complications such as stress on the lungs, brain and the heart. Individuals with prior lung problems may develop labored breathing and respiratory distress.   Patient seen and examined by NP/APP with MD. MD to update note as needed.   Janine Ores, DNP, FNP-BC [facility] Pager: [phone]  Attending Neurologist's note:  I personally saw this patient, gathering history, performing a full neurologic examination, reviewing relevant labs, personally reviewing relevant imaging including head CT and MRI brain, and formulated the assessment and plan, adding the note above for completeness and clarity to accurately reflect my thoughts   Lesleigh Noe MD-PhD [facility] [phone]  Available 7 AM to 7 PM, outside these hours please contact Neurologist on call listed on AMION

## 2022-06-11 NOTE — ED Notes (Signed)
The pt is too obtunded  to get oral meds at present  she roused slightly to say she was hungry then was snoring a few seconds later

## 2022-06-11 NOTE — ED Notes (Signed)
The attempted neuro check  not possible  totally out  does not respond to any commands  friend at bedside  reports m sleeping from meds

## 2022-06-11 NOTE — H&P (Signed)
History and Physical    Jennifer Crawford TMH:[id number] DOB: [birth year] DOA: 06/11/2022  PCP: Pcp, No (Confirm with patient/family/NH records and if not entered, this has to be entered at [facility] point of entry) Patient coming from: Home  I have personally briefly reviewed patient's old medical records in [place]  Chief Complaint: Patient still post-ictal  HPI: Jennifer Crawford is a 46 y.o. female with medical history significant of seizure disorder, alcohol abuse, polysubstance abuse on naltrexone, anxiety/depression, brought in by boyfriend for suspected seizure activity.  Patient is still postictal and drowsy, unable to answer any questions, or history provided patient's boyfriend at bedside.  Family reported the patient " was really stressed out upon hearing some news yesterday evening", then patient started to drink beer, 1 to 2 cans, then suddenly patient became unresponsive and started to have whole body shaking, and to stop 2 times with stomach content, with eyes closed.  No tongue biting no loss control of urine or bowel movement.  EMS arrived and found patient still seizing and milligram Versed given then patient developed another second episode of seizure on route to hospital did not was given another 2.5 mg Versed IV.  Boyfriend denied patient drink alcohol every day, claiming that she only drinks 1-2 beers occasionally.  ED Course: Patient remained postictal with drowsyness, CT head negative for acute findings.  Blood work showed alcohol level> 125, Keppra was started in ED  Review of Systems: Unable to perform, patient drowsiness and confused  Past Medical History:  Diagnosis Date   Anxiety and depression    Diabetes mellitus without complication ([place])    Right flank pain 05/2019   Right upper quadrant pain 8/22020   Seizures ([place]) 2015   Vitamin D deficiency 05/2019    Past Surgical History:  Procedure Laterality Date   ABDOMINAL HYSTERECTOMY      TUBAL LIGATION       reports that she has never smoked. She has never used smokeless tobacco. She reports current alcohol use. She reports that she does not use drugs.  Allergies  Allergen Reactions   Ibuprofen Diarrhea   Tramadol Itching    Vaginal itching, per patient's original chart   Acetaminophen Rash    Family History  Problem Relation Age of Onset   Seizures Daughter    Asthma Son    Hypertension Father    Arrhythmia Father        Status post pacemaker insertion   Hypertension Mother    Diabetes Mother      Prior to Admission medications   Medication Sig Start Date End Date Taking? Authorizing Provider  albuterol (PROVENTIL) (2.5 MG/3ML) 0.083% nebulizer solution Take 3 mLs (2.5 mg total) by nebulization every 6 (six) hours as needed for wheezing or shortness of breath. 04/04/20   Truddie Hidden, MD  albuterol (VENTOLIN HFA) 108 (90 Base) MCG/ACT inhaler Inhale 2 puffs into the lungs every 4 (four) hours as needed for wheezing or shortness of breath. 02/10/20   Margarita Mail, PA-C  blood glucose meter kit and supplies Dispense based on patient and insurance preference. Use up to four times daily as directed. (FOR ICD-10 E10.9, E11.9). 05/13/19   Azzie Glatter, FNP  Budesonide (PULMICORT FLEXHALER) 90 MCG/ACT inhaler Inhale 1 puff into the lungs 2 (two) times daily. Rinse mouth with water and spit after every use. Patient taking differently: Inhale 1 puff into the lungs daily as needed (for shortness of breath). Rinse mouth with water and spit  after every use. 10/13/20   Zigmund Gottron, NP  cyclobenzaprine (FLEXERIL) 10 MG tablet Take 10 mg by mouth 3 (three) times daily. 05/17/22   [provider]  levETIRAcetam (KEPPRA) 1000 MG tablet Take 1 tablet (1,000 mg total) by mouth 2 (two) times daily. 11/06/21   Larene Pickett, PA-C  levETIRAcetam (KEPPRA) 1000 MG tablet Take 1 tablet (1,000 mg total) by mouth 2 (two) times daily. 02/18/22   Valarie Merino, MD   lidocaine (LIDODERM) 5 % Place 1 patch onto the skin daily. Remove & Discard patch within 12 hours or as directed by MD 09/23/21   Malvin Johns, MD  naltrexone (DEPADE) 50 MG tablet Take 25 mg by mouth daily. 01/17/22   [provider]  naproxen (NAPROSYN) 500 MG tablet Take 1 tablet (500 mg total) by mouth 2 (two) times daily. 12/05/20   Henderly, Britni A, PA-C  PHENObarbital (LUMINAL) 30 MG tablet Take 30 mg by mouth every 12 (twelve) hours. 01/17/22   [provider]    Physical Exam: Vitals:   06/11/22 1400 06/11/22 1415 06/11/22 1430 06/11/22 1445  BP: 107/74 104/74 97/68 99/63   Pulse: 66 66 66 72  Resp: 15 13  14   Temp:      TempSrc:      SpO2: 100% 100% 100% 100%    Constitutional: NAD, calm, comfortable Vitals:   06/11/22 1400 06/11/22 1415 06/11/22 1430 06/11/22 1445  BP: 107/74 104/74 97/68 99/63   Pulse: 66 66 66 72  Resp: 15 13  14   Temp:      TempSrc:      SpO2: 100% 100% 100% 100%   Eyes: PERRL, lids and conjunctivae normal ENMT: Mucous membranes are moist. Posterior pharynx clear of any exudate or lesions.Normal dentition.  Neck: normal, supple, no masses, no thyromegaly Respiratory: clear to auscultation bilaterally, no wheezing, no crackles. Normal respiratory effort. No accessory muscle use.  Cardiovascular: Regular rate and rhythm, no murmurs / rubs / gallops. No extremity edema. 2+ pedal pulses. No carotid bruits.  Abdomen: no tenderness, no masses palpated. No hepatosplenomegaly. Bowel sounds positive.  Musculoskeletal: no clubbing / cyanosis. No joint deformity upper and lower extremities. Good ROM, no contractures. Normal muscle tone.  Skin: no rashes, lesions, ulcers. No induration Neurologic: No facial droops, following simple commands, Psychiatric: Responds to voice, confused, lethargic    Labs on Admission: I have personally reviewed following labs and imaging studies  CBC: Recent Labs  Lab 06/11/22 0245  WBC 4.4  NEUTROABS  2.0  HGB 11.7*  HCT 35.9*  MCV 94.5  PLT 286   Basic Metabolic Panel: Recent Labs  Lab 06/11/22 0245  NA 138  K 3.3*  CL 107  CO2 22  GLUCOSE 93  BUN 10  CREATININE 1.11*  CALCIUM 9.1   GFR: CrCl cannot be calculated (Unknown ideal weight.). Liver Function Tests: Recent Labs  Lab 06/11/22 0245  AST 28  ALT 20  ALKPHOS 60  BILITOT 0.3  PROT 7.6  ALBUMIN 4.1   No results for input(s): "LIPASE", "AMYLASE" in the last 168 hours. No results for input(s): "AMMONIA" in the last 168 hours. Coagulation Profile: No results for input(s): "INR", "PROTIME" in the last 168 hours. Cardiac Enzymes: No results for input(s): "CKTOTAL", "CKMB", "CKMBINDEX", "TROPONINI" in the last 168 hours. BNP (last 3 results) No results for input(s): "PROBNP" in the last 8760 hours. HbA1C: No results for input(s): "HGBA1C" in the last 72 hours. CBG: Recent Labs  Lab 06/11/22 0230  GLUCAP 81   Lipid Profile: No results for input(s): "CHOL", "HDL", "LDLCALC", "TRIG", "CHOLHDL", "LDLDIRECT" in the last 72 hours. Thyroid Function Tests: No results for input(s): "TSH", "T4TOTAL", "FREET4", "T3FREE", "THYROIDAB" in the last 72 hours. Anemia Panel: No results for input(s): "VITAMINB12", "FOLATE", "FERRITIN", "TIBC", "IRON", "RETICCTPCT" in the last 72 hours. Urine analysis:    Component Value Date/Time   COLORURINE YELLOW 02/19/2022 0059   APPEARANCEUR CLEAR 02/19/2022 0059   LABSPEC 1.010 02/19/2022 0059   PHURINE 5.0 02/19/2022 0059   GLUCOSEU NEGATIVE 02/19/2022 0059   HGBUR SMALL (A) 02/19/2022 0059   BILIRUBINUR NEGATIVE 02/19/2022 0059   BILIRUBINUR neg 06/16/2019 1402   KETONESUR NEGATIVE 02/19/2022 0059   PROTEINUR NEGATIVE 02/19/2022 0059   UROBILINOGEN 0.2 12/22/2020 1413   NITRITE NEGATIVE 02/19/2022 0059   LEUKOCYTESUR NEGATIVE 02/19/2022 0059    Radiological Exams on Admission: CT Head Wo Contrast  Result Date: 06/11/2022 CLINICAL DATA:  Seizure EXAM: CT HEAD WITHOUT  CONTRAST TECHNIQUE: Contiguous axial images were obtained from the base of the skull through the vertex without intravenous contrast. RADIATION DOSE REDUCTION: This exam was performed according to the departmental dose-optimization program which includes automated exposure control, adjustment of the mA and/or kV according to patient size and/or use of iterative reconstruction technique. COMPARISON:  02/18/2022 FINDINGS: Brain: No evidence of acute infarction, hemorrhage, hydrocephalus, extra-axial collection or mass lesion/mass effect. Vascular: No hyperdense vessel or unexpected calcification. Skull: Normal. Negative for fracture or focal lesion. Sinuses/Orbits: The visualized paranasal sinuses are essentially clear. The mastoid air cells are unopacified. Other: None. IMPRESSION: Normal head CT. Electronically Signed   By: Julian Hy M.D.   On: 06/11/2022 03:44    EKG: Independently reviewed.  Sinus rhythm, LVH.  Assessment/Plan Principal Problem:   Seizure [facility]) Active Problems:   Seizure disorder ([place])  (please populate well all problems here in Problem List. (For example, if patient is on BP meds at home and you resume or decide to hold them, it is a problem that needs to be her. Same for CAD, COPD, HLD and so on)   Acute metabolic encephalopathy -Postictal versus alcohol intoxication -CIWA protocol -Seizure precautions -Continue home dose of Keppra 1000 mg twice daily.  Keppra level was not sent in the ED. -N.p.o. for now -Neurology consultation appreciated, MRI pending more awake, EEG as per neurology. -Other Ddx, UA, UDS pending.  Low suspicion for status epilepticus, admit to telemetry  Breakthrough seizure versus noncoherent with seizure medication -As above.  Narcotic abuse -Continue naltrexone when able to.  Alcohol abuse -Currently, there is no symptoms signs of acute alcohol withdrawal -CIWA protocol with as needed benzos.  Asthma/COPD -Breathing normally, no  symptoms or signs of acute exacerbation, continue home Pulmicort and as needed albuterol.  DVT prophylaxis: Lovenox Code Status: Full code Family Communication: Boyfriend at bedside Disposition Plan: Expect less than 2 midnight hospital stay Consults called: Neurology Admission status: Telemetry observation   Lequita Halt MD [facility] Pager [phone]  06/11/2022, 2:57 PM

## 2022-06-11 NOTE — ED Notes (Signed)
The pt is more alert  admitting doctor wil place a diet order  and she can eat if she stays awake

## 2022-06-11 NOTE — Discharge Instructions (Signed)
Please be aware you may have another seizure ° °Do not drive until seen by your physician for your condition ° °Do not climb ladders/roofs/trees as a seizure can occur at that height and cause serious harm ° °Do not bathe/swim alone as a seizure can occur and cause serious harm ° °Please followup with your physician or neurologist for further testing and possible treatment ° ° °

## 2022-06-11 NOTE — ED Provider Notes (Signed)
Patient signed out to me at 0700 by Dr. Bebe Shaggy pending sobriety.  In short, this is a 46 year old female with a past medical history of seizures and alcohol use presenting to the emergency department with a breakthrough seizure.  The patient did require 7.5 mg of IM Versed from medics.  She has had no further seizure activity while she has been here.  She was additionally intoxicated with alcohol.  Upon my evaluation, the patient is asleep but easily arousable by voice.  She reports that she is having a headache.  Her speech sounds clear.  She still appears mildly intoxicated and will be monitored for sobriety until she can walk steadily.  She is here with a family member will be able to safely get her home.   Phoebe Sharps, DO 06/11/22 [phone]

## 2022-06-11 NOTE — Progress Notes (Signed)
LTM EEG hooked up and running - no initial skin breakdown  - neuro notified. [facility] NOT monitoring. PT in ED. Instruction on how to move pt is on the [place] and RN notified

## 2022-06-11 NOTE — ED Triage Notes (Signed)
Patient arrives with [facility] after being picked up from [facility]. Pt's family member called 911 after pt had a tonic-clonic seizure. Pt has hx of seizures; pt was post-ictal on EMS arrival. Pt given 5 mg Versed IM, pt then seized again, then was given another 2.5 mg Versed IV by EMS. NPA placed by EMS in left nare.

## 2022-06-11 NOTE — ED Notes (Signed)
To mri 

## 2022-06-12 ENCOUNTER — Other Ambulatory Visit: Payer: Self-pay

## 2022-06-12 DIAGNOSIS — G40909 Epilepsy, unspecified, not intractable, without status epilepticus: Principal | ICD-10-CM

## 2022-06-12 DIAGNOSIS — R569 Unspecified convulsions: Secondary | ICD-10-CM | POA: Diagnosis not present

## 2022-06-12 LAB — BASIC METABOLIC PANEL
Anion gap: 6 (ref 5–15)
BUN: 12 mg/dL (ref 6–20)
CO2: 23 mmol/L (ref 22–32)
Calcium: 8.8 mg/dL — ABNORMAL LOW (ref 8.9–10.3)
Chloride: 108 mmol/L (ref 98–111)
Creatinine, Ser: 1.05 mg/dL — ABNORMAL HIGH (ref 0.44–1.00)
GFR, Estimated: >60 mL/min (ref >60)
Glucose, Bld: 123 mg/dL — ABNORMAL HIGH (ref 70–99)
Potassium: 4 mmol/L (ref 3.5–5.1)
Sodium: 137 mmol/L (ref 135–145)

## 2022-06-12 LAB — CK: Total CK: 370 U/L — ABNORMAL HIGH (ref 38–234)

## 2022-06-12 MED ORDER — ORAL CARE MOUTH RINSE
15.0000 mL | Freq: Two times a day (BID) | OROMUCOSAL | Status: DC
  Administered 2022-06-12 – 2022-06-13 (×2): 15 mL via OROMUCOSAL

## 2022-06-12 NOTE — Procedures (Signed)
Patient Name: Jennifer Crawford  MRN: [id number]  Epilepsy Attending: Charlsie Quest  Referring Physician/Provider: Elmer Picker, NP Duration: 06/11/2022 1745 to 06/12/2022 1745  Patient history: 46 y.o. female with a past medical history of anxiety, depression, alcohol abuse, polysubstance abuse on naltrexone, diabetes myelitis, vitamin D deficiency, and seizures.  Patient presents today after being picked up by EMS from [facility].  A family member called EMS after the patient had a reported tonic clonic seizure.  On EEG to evaluate for seizure.  Level of alertness: Awake, asleep  AEDs during EEG study: LEV, Phenobarb, versed  Technical aspects: This EEG study was done with scalp electrodes positioned according to the 10-20 International system of electrode placement. Electrical activity was reviewed with band pass filter of 1-70Hz , sensitivity of 7 uV/mm, display speed of 64mm/sec with a 60Hz  notched filter applied as appropriate. EEG data were recorded continuously and digitally stored.  Video monitoring was available and reviewed as appropriate.  Description: The posterior dominant rhythm consists of 10 Hz activity of moderate voltage (25-35 uV) seen predominantly in posterior head regions, symmetric and reactive to eye opening and eye closing. Sleep was characterized by vertex waves, sleep spindles (12 to 14 Hz), maximal frontocentral region. Hyperventilation and photic stimulation were not performed.     IMPRESSION: This study is within normal limits. No seizures or epileptiform discharges were seen throughout the recording.  A normal interictal EEG does not exclude nor support the diagnosis of epilepsy.   [name] 

## 2022-06-12 NOTE — Progress Notes (Signed)
  Transition of Care [facility]) Screening Note   Patient Details  Name: Jennifer Crawford Date of Birth: 1976-09-13   [facility]) CM/SW Contact:    Harriet Masson, RN Phone Number: 06/12/2022, 8:36 AM    Transition of Care Department [facility]) has reviewed patient. We will continue to monitor patient advancement through interdisciplinary progression rounds.  Patient needs substance abuse counseling

## 2022-06-12 NOTE — Progress Notes (Signed)
PROGRESS NOTE    Jennifer Crawford  WUJ:[id number] DOB: 1975-10-23 DOA: 06/11/2022  PCP: Pcp, No    Brief Narrative: This 46 years old female with PMH significant for seizure disorder, alcohol abuse, polysubstance abuse on naltrexone, anxiety/depression brought in by the boyfriend for suspected seizure activity.  Boyfriend reports that patient was really stressed out about hearing some news yesterday evening then she started to drink beer 1 to 2 cans then suddenly became unresponsive and started to have whole body shaking and she threw up 2 times with the stomach content.  He denied any tongue biting or urinary or bowel involuntary movements.  EMS arrived and found patient was still seizing , Patient was given Versed.  She has developed second episode of seizures in route to the hospital.  Boyfriend reports patient drinks alcohol every day.  CT head negative for acute findings.  Patient was loaded with Keppra and neurology is consulted.  Assessment & Plan:   Principal Problem:   Seizure (HCC) Active Problems:   Seizure disorder (HCC)  Acute metabolic Encephalopathy: Postictal versus alcohol intoxication: Patient presented with whole body shaking following heavy alcohol intake. Could be  seizures from alcohol intoxication. Continue CIWA protocol Continue seizure precautions. Continue Keppra 1000 mg twice daily. Kept NPO and then resumed on regular diet. Neurology is consulted recommended MRI which is unremarkable for any acute abnormality. EEG was completed shows no evidence of seizures. Low suspicion for status epilepticus.   Urine drug screen positive for cocaine and benzos. Recommend alcohol and cocaine cessation. Trend CK level as she has high risk for rhabdomyolysis from cocaine and seizures.  Breakthrough seizures: Patient seems noncompliant with seizure medications Neurology consulted, MRI negative for acute abnormality Continue Keppra.  EEG no evidence of  seizures. Neurology recommended outpatient follow-up  Narcotic abuse: Continue naltrexone when able to  Alcohol abuse: There is no signs and symptoms of acute alcohol withdrawal. Continue CIWA protocol with as needed benzos  Asthma/COPD: Patient breathing normally.  No signs of any acute exacerbation. Continue home Pulmicort and as needed albuterol.  Substance abuse: Urine drug seen positive for cocaine and benzos. Recommend alcohol and cocaine cessation Child psychotherapist consult  DVT prophylaxis: Lovenox Code Status: Full code Family Communication: Family at bedside Disposition Plan:   Status is: Observation The patient remains OBS appropriate and will d/c before 2 midnights.  Admitted for breakthrough seizures likely due to alcohol intoxication.  Requiring CIWA protocol. EEG shows no evidence of seizures.  MRI negative.  Neurology recommended continuing Keppra and outpatient follow-up. Patient continued on CIWA protocol.  Anticipated discharge home 06/13/2022     Consultants:  Neurology  Procedures: MRI brain, EEG Antimicrobials:  Anti-infectives (From admission, onward)    None       Subjective: Patient was seen and examined at bedside.  Overnight events noted. Patient reports feeling better.  She is connected with a long-term EEG. Patient denies any headache or dizziness or palpitations.  Objective: Vitals:   06/12/22 0432 06/12/22 0800 06/12/22 0815 06/12/22 1000  BP:  108/67    Pulse:  78    Resp:  15    Temp: 98.9 F (37.2 C)   97.8 F (36.6 C)  TempSrc: Oral   Oral  SpO2:  96% 96%    No intake or output data in the 24 hours ending 06/12/22 1156 There were no vitals filed for this visit.  Examination:  General exam: Appears comfortable, not in any acute distress.  Deconditioned Respiratory system: CTA  bilaterally, no wheezing, no crackles, normal respiratory effort. Cardiovascular system: S1 & S2 heard, regular rate and rhythm, no  murmur. Gastrointestinal system: Abdomen is soft, non tender, non distended, BS+ Central nervous system: Alert and oriented x 3. No focal neurological deficits. Extremities: No edema, no cyanosis, no clubbing Skin: No rashes, lesions or ulcers Psychiatry: Judgement and insight appear normal. Mood & affect appropriate.     Data Reviewed: I have personally reviewed following labs and imaging studies  CBC: Recent Labs  Lab 06/11/22 0245  WBC 4.4  NEUTROABS 2.0  HGB 11.7*  HCT 35.9*  MCV 94.5  PLT [id number]   Basic Metabolic Panel: Recent Labs  Lab 06/11/22 0245 06/12/22 0717  NA 138 137  K 3.3* 4.0  CL 107 108  CO2 22 23  GLUCOSE 93 123*  BUN 10 12  CREATININE 1.11* 1.05*  CALCIUM 9.1 8.8*   GFR: CrCl cannot be calculated (Unknown ideal weight.). Liver Function Tests: Recent Labs  Lab 06/11/22 0245  AST 28  ALT 20  ALKPHOS 60  BILITOT 0.3  PROT 7.6  ALBUMIN 4.1   No results for input(s): "LIPASE", "AMYLASE" in the last 168 hours. No results for input(s): "AMMONIA" in the last 168 hours. Coagulation Profile: No results for input(s): "INR", "PROTIME" in the last 168 hours. Cardiac Enzymes: Recent Labs  Lab 06/11/22 2211 06/12/22 0717  CKTOTAL 489* 370*   BNP (last 3 results) No results for input(s): "PROBNP" in the last 8760 hours. HbA1C: No results for input(s): "HGBA1C" in the last 72 hours. CBG: Recent Labs  Lab 06/11/22 0230  GLUCAP 81   Lipid Profile: No results for input(s): "CHOL", "HDL", "LDLCALC", "TRIG", "CHOLHDL", "LDLDIRECT" in the last 72 hours. Thyroid Function Tests: No results for input(s): "TSH", "T4TOTAL", "FREET4", "T3FREE", "THYROIDAB" in the last 72 hours. Anemia Panel: No results for input(s): "VITAMINB12", "FOLATE", "FERRITIN", "TIBC", "IRON", "RETICCTPCT" in the last 72 hours. Sepsis Labs: No results for input(s): "PROCALCITON", "LATICACIDVEN" in the last 168 hours.  No results found for this or any previous visit (from the  past 240 hour(s)).       Radiology Studies: Overnight EEG with video  Result Date: 06/12/2022 Lora Havens, MD     06/12/2022  8:56 AM Patient Name: Jennifer Crawford MRN: [id number] Epilepsy Attending: Lora Havens Referring Physician/Provider: Janine Ores, NP Duration: 06/11/2022 1745 to 06/12/2022 0900 Patient history: 46 y.o. female with a past medical history of anxiety, depression, alcohol abuse, polysubstance abuse on naltrexone, diabetes myelitis, vitamin D deficiency, and seizures.  Patient presents today after being picked up by EMS from [facility].  A family member called EMS after the patient had a reported tonic clonic seizure.  On EEG to evaluate for seizure. Level of alertness: Awake, asleep AEDs during EEG study: LEV, Phenobarb, versed Technical aspects: This EEG study was done with scalp electrodes positioned according to the 10-20 International system of electrode placement. Electrical activity was reviewed with band pass filter of 1-70Hz , sensitivity of 7 uV/mm, display speed of 16mm/sec with a 60Hz  notched filter applied as appropriate. EEG data were recorded continuously and digitally stored.  Video monitoring was available and reviewed as appropriate. Description: The posterior dominant rhythm consists of 10 Hz activity of moderate voltage (25-35 uV) seen predominantly in posterior head regions, symmetric and reactive to eye opening and eye closing. Sleep was characterized by vertex waves, sleep spindles (12 to 14 Hz), maximal frontocentral region. Hyperventilation and photic stimulation were not performed.   IMPRESSION:  This study is within normal limits. No seizures or epileptiform discharges were seen throughout the recording. A normal interictal EEG does not exclude nor support the diagnosis of epilepsy. Charlsie Quest   MR BRAIN W WO CONTRAST  Result Date: 06/11/2022 CLINICAL DATA:  Seizure. New onset. No history of trauma. EXAM: MRI HEAD WITHOUT AND WITH CONTRAST  TECHNIQUE: Multiplanar, multiecho pulse sequences of the brain and surrounding structures were obtained without and with intravenous contrast. CONTRAST:  7.92mL GADAVIST GADOBUTROL 1 MMOL/ML IV SOLN COMPARISON:  CT head without contrast 06/11/2022. MR head 06/20/2015. FINDINGS: Brain: No acute infarct, hemorrhage, or mass lesion is present. No significant white matter lesions are present. The ventricles are of normal size. No significant extraaxial fluid collection is present. Dedicated imaging of the temporal lobes demonstrates symmetric size and signal of the hippocampal structures. No mass lesion present. The internal auditory canals are within normal limits. The brainstem and cerebellum are within normal limits. Postcontrast images demonstrate no pathologic enhancement. Vascular: Flow is present in the major intracranial arteries. Skull and upper cervical spine: The craniocervical junction is normal. Upper cervical spine is within normal limits. Marrow signal is unremarkable. Sinuses/Orbits: The paranasal sinuses and mastoid air cells are clear. The globes and orbits are within normal limits. IMPRESSION: Negative MRI of the brain without and with contrast. No acute or focal lesion to explain seizures. Electronically Signed   By: Marin Roberts M.D.   On: 06/11/2022 16:53   CT Head Wo Contrast  Result Date: 06/11/2022 CLINICAL DATA:  Seizure EXAM: CT HEAD WITHOUT CONTRAST TECHNIQUE: Contiguous axial images were obtained from the base of the skull through the vertex without intravenous contrast. RADIATION DOSE REDUCTION: This exam was performed according to the departmental dose-optimization program which includes automated exposure control, adjustment of the mA and/or kV according to patient size and/or use of iterative reconstruction technique. COMPARISON:  02/18/2022 FINDINGS: Brain: No evidence of acute infarction, hemorrhage, hydrocephalus, extra-axial collection or mass lesion/mass effect. Vascular:  No hyperdense vessel or unexpected calcification. Skull: Normal. Negative for fracture or focal lesion. Sinuses/Orbits: The visualized paranasal sinuses are essentially clear. The mastoid air cells are unopacified. Other: None. IMPRESSION: Normal head CT. Electronically Signed   By: Charline Bills M.D.   On: 06/11/2022 03:44     Scheduled Meds:  budesonide (PULMICORT) nebulizer solution  0.25 mg Nebulization BID   cyclobenzaprine  10 mg Oral TID   enoxaparin (LOVENOX) injection  40 mg Subcutaneous Q24H   folic acid  1 mg Oral Daily   levETIRAcetam  1,000 mg Oral BID   multivitamin with minerals  1 tablet Oral Daily   naltrexone  25 mg Oral Daily   mouth rinse  15 mL Mouth Rinse BID   PHENobarbital  32.4 mg Oral Q12H   thiamine  100 mg Oral Daily   Or   thiamine  100 mg Intravenous Daily   Continuous Infusions:   LOS: 0 days    Time spent: 50 mins    Dougles Kimmey, MD [facility]   If 7PM-7AM, please contact night-coverage

## 2022-06-12 NOTE — Progress Notes (Signed)
Pt's boyfriend is threatening to take pt home AMA. Pt is still very lethargic. Pt's boyfriend stated that he feels we are making her "sicker." This nurse called to speak with pt's parents and they said that pt will not go home and that the boyfriend cannot take her AMA. MD made aware.

## 2022-06-12 NOTE — Plan of Care (Signed)
°  Problem: Activity: °Goal: Risk for activity intolerance will decrease °Outcome: Progressing °  °Problem: Nutrition: °Goal: Adequate nutrition will be maintained °Outcome: Progressing °  °Problem: Coping: °Goal: Level of anxiety will decrease °Outcome: Progressing °  °Problem: Elimination: °Goal: Will not experience complications related to bowel motility °Outcome: Progressing °  °Problem: Skin Integrity: °Goal: Risk for impaired skin integrity will decrease °Outcome: Progressing °  °

## 2022-06-13 DIAGNOSIS — G40909 Epilepsy, unspecified, not intractable, without status epilepticus: Secondary | ICD-10-CM | POA: Diagnosis not present

## 2022-06-13 DIAGNOSIS — R569 Unspecified convulsions: Secondary | ICD-10-CM | POA: Diagnosis not present

## 2022-06-13 LAB — CBC
HCT: 33.9 % — ABNORMAL LOW (ref 36.0–46.0)
Hemoglobin: 11.5 g/dL — ABNORMAL LOW (ref 12.0–15.0)
MCH: 31.6 pg (ref 26.0–34.0)
MCHC: 33.9 g/dL (ref 30.0–36.0)
MCV: 93.1 fL (ref 80.0–100.0)
Platelets: 184 10*3/uL (ref 150–400)
RBC: 3.64 MIL/uL — ABNORMAL LOW (ref 3.87–5.11)
RDW: 13.3 % (ref 11.5–15.5)
WBC: 5.2 10*3/uL (ref 4.0–10.5)
nRBC: 0 % (ref 0.0–0.2)

## 2022-06-13 LAB — BASIC METABOLIC PANEL
Anion gap: 7 (ref 5–15)
BUN: 10 mg/dL (ref 6–20)
CO2: 22 mmol/L (ref 22–32)
Calcium: 8.9 mg/dL (ref 8.9–10.3)
Chloride: 108 mmol/L (ref 98–111)
Creatinine, Ser: 0.92 mg/dL (ref 0.44–1.00)
GFR, Estimated: >60 mL/min (ref >60)
Glucose, Bld: 131 mg/dL — ABNORMAL HIGH (ref 70–99)
Potassium: 3.6 mmol/L (ref 3.5–5.1)
Sodium: 137 mmol/L (ref 135–145)

## 2022-06-13 LAB — MAGNESIUM: Magnesium: 1.7 mg/dL (ref 1.7–2.4)

## 2022-06-13 LAB — HIV ANTIBODY (ROUTINE TESTING W REFLEX): HIV Screen 4th Generation wRfx: NONREACTIVE

## 2022-06-13 LAB — PHOSPHORUS: Phosphorus: 3.1 mg/dL (ref 2.5–4.6)

## 2022-06-13 MED ORDER — VITAMIN B-1 100 MG PO TABS: 100.0000 mg | ORAL_TABLET | Freq: Every day | ORAL | 1 refills | Status: AC

## 2022-06-13 MED ORDER — LEVETIRACETAM 1000 MG PO TABS: 1000.0000 mg | ORAL_TABLET | Freq: Two times a day (BID) | ORAL | 2 refills | Status: AC

## 2022-06-13 MED ORDER — FOLIC ACID 1 MG PO TABS: 1.0000 mg | ORAL_TABLET | Freq: Every day | ORAL | 1 refills | Status: AC

## 2022-06-13 NOTE — Evaluation (Signed)
Physical Therapy Evaluation Patient Details Name: Jennifer Crawford MRN: [id number] DOB: 01/22/1976 Today's Date: 06/13/2022  History of Present Illness  Pt is a 46 y/o female admitted secondary to seizures. PMH includes DM, alcohol abuse, polysubstance abuse.  Clinical Impression  Pt admitted secondary to problem above with deficits below. Pt with increased fatigue and back pain this session. Mild unsteadiness requiring min guard A. Educated about using rollator at d/c to increase safety. Feel pt would benefit from outpatient PT at d/c to address deficits. Will continue to follow acutely.        Recommendations for follow up therapy are one component of a multi-disciplinary discharge planning process, led by the attending physician.  Recommendations may be updated based on patient status, additional functional criteria and insurance authorization.  Follow Up Recommendations Outpatient PT      Assistance Recommended at Discharge Intermittent Supervision/Assistance  Patient can return home with the following  Assistance with cooking/housework;Assist for transportation    Equipment Recommendations Rollator (4 wheels)  Recommendations for Other Services       Functional Status Assessment Patient has had a recent decline in their functional status and demonstrates the ability to make significant improvements in function in a reasonable and predictable amount of time.     Precautions / Restrictions Precautions Precautions: Fall Restrictions Weight Bearing Restrictions: No      Mobility  Bed Mobility Overal bed mobility: Modified Independent                  Transfers Overall transfer level: Needs assistance Equipment used: None Transfers: Sit to/from Stand Sit to Stand: Min guard           General transfer comment: Min guard for safety    Ambulation/Gait Ambulation/Gait assistance: Min guard Gait Distance (Feet): 175 Feet Assistive device: None Gait  Pattern/deviations: Step-through pattern, Decreased stride length Gait velocity: Decreased     General Gait Details: Slow, cautious gait. Pt reporting increased fatigue throughout mobility. Also reporting back pain which limited further mobility.  Stairs            Wheelchair Mobility    Modified Rankin (Stroke Patients Only)       Balance Overall balance assessment: Mild deficits observed, not formally tested                                           Pertinent Vitals/Pain Pain Assessment Pain Assessment: Faces Faces Pain Scale: Hurts even more Pain Location: back Pain Descriptors / Indicators: Grimacing, Guarding Pain Intervention(s): Limited activity within patient's tolerance, Monitored during session, Repositioned    Home Living Family/patient expects to be discharged to:: Shelter/Homeless                   Additional Comments: Pt reports she does not have anywhere to go at d/c.    Prior Function Prior Level of Function : Independent/Modified Independent                     Hand Dominance        Extremity/Trunk Assessment   Upper Extremity Assessment Upper Extremity Assessment: Overall WFL for tasks assessed    Lower Extremity Assessment Lower Extremity Assessment: Generalized weakness    Cervical / Trunk Assessment Cervical / Trunk Assessment: Other exceptions Cervical / Trunk Exceptions: chronic back pain  Communication   Communication: No difficulties  Cognition  Arousal/Alertness: Awake/alert Behavior During Therapy: WFL for tasks assessed/performed Overall Cognitive Status: Within Functional Limits for tasks assessed                                          General Comments General comments (skin integrity, edema, etc.): Pt's boyfriend present throughout    Exercises     Assessment/Plan    PT Assessment Patient needs continued PT services  PT Problem List Decreased strength;Decreased  activity tolerance;Decreased balance;Decreased mobility       PT Treatment Interventions DME instruction;Gait training;Stair training;Therapeutic exercise;Therapeutic activities;Functional mobility training;Balance training;Patient/family education    PT Goals (Current goals can be found in the Care Plan section)  Acute Rehab PT Goals Patient Stated Goal: to find a place to go PT Goal Formulation: With patient Time For Goal Achievement: 06/27/22 Potential to Achieve Goals: Good    Frequency Min 3X/week     Co-evaluation               AM-PAC PT "6 Clicks" Mobility  Outcome Measure Help needed turning from your back to your side while in a flat bed without using bedrails?: None Help needed moving from lying on your back to sitting on the side of a flat bed without using bedrails?: None Help needed moving to and from a bed to a chair (including a wheelchair)?: A Little Help needed standing up from a chair using your arms (e.g., wheelchair or bedside chair)?: A Little Help needed to walk in hospital room?: A Little Help needed climbing 3-5 steps with a railing? : A Little 6 Click Score: 20    End of Session Equipment Utilized During Treatment: Gait belt Activity Tolerance: Patient limited by fatigue Patient left: in bed;with call bell/phone within reach;with family/visitor present Nurse Communication: Mobility status PT Visit Diagnosis: Unsteadiness on feet (R26.81);Muscle weakness (generalized) (M62.81)    Time: 1037-1050 PT Time Calculation (min) (ACUTE ONLY): 13 min   Charges:   PT Evaluation $PT Eval Low Complexity: 1 Low          Farley Ly, PT, DPT  Acute Rehabilitation Services  Office: [phone]   Lehman Prom 06/13/2022, 11:10 AM

## 2022-06-13 NOTE — Progress Notes (Signed)
vLtM discontinued.  No skin breakdown noted at all skin sites  [facility] notified.

## 2022-06-13 NOTE — Procedures (Addendum)
Patient Name: Jennifer Crawford  MRN: [id number]  Epilepsy Attending: Charlsie Quest  Referring Physician/Provider: Elmer Picker, NP Duration: 06/12/2022 1745 to 06/13/2022  1010   Patient history: 46 y.o. female with a past medical history of anxiety, depression, alcohol abuse, polysubstance abuse on naltrexone, diabetes myelitis, vitamin D deficiency, and seizures.  Patient presents today after being picked up by EMS from [facility].  A family member called EMS after the patient had a reported tonic clonic seizure.  On EEG to evaluate for seizure.   Level of alertness: Awake, asleep   AEDs during EEG study: LEV, Phenobarb   Technical aspects: This EEG study was done with scalp electrodes positioned according to the 10-20 International system of electrode placement. Electrical activity was reviewed with band pass filter of 1-70Hz , sensitivity of 7 uV/mm, display speed of 66mm/sec with a 60Hz  notched filter applied as appropriate. EEG data were recorded continuously and digitally stored.  Video monitoring was available and reviewed as appropriate.   Description: The posterior dominant rhythm consists of 10 Hz activity of moderate voltage (25-35 uV) seen predominantly in posterior head regions, symmetric and reactive to eye opening and eye closing. Sleep was characterized by vertex waves, sleep spindles (12 to 14 Hz), maximal frontocentral region. Hyperventilation and photic stimulation were not performed.      IMPRESSION: This study is within normal limits. No seizures or epileptiform discharges were seen throughout the recording.   A normal interictal EEG does not exclude nor support the diagnosis of epilepsy.     [name] 

## 2022-06-13 NOTE — Progress Notes (Signed)
Discharge instructions (including medications) discussed with and copy provided to patient/caregiver 

## 2022-06-13 NOTE — TOC Progression Note (Addendum)
Transition of Care [facility]) - Progression Note    Patient Details  Name: Jennifer Crawford MRN: [id number] Date of Birth: 05-25-1976  [facility] CM/SW Contact  Huston Foley Jacklynn Ganong, RN Phone Number: 06/13/2022, 11:40 AM  Clinical Narrative:   Rollator requested from [employer], to be delivered to patient's room.   12:16pm: Notified by Diego Cory with [employer] that patient received a Rolling walker on March 30, 2022,does not qualify for rollator unless  she pays out of pocket for it. Patient informed of this by [employer] representative, CM updated RN.    Expected Discharge Plan: Home/Self Care Barriers to Discharge: Barriers Resolved  Expected Discharge Plan and Services Expected Discharge Plan: Home/Self Care   Discharge Planning Services: CM Consult   Living arrangements for the past 2 months: Apartment Expected Discharge Date: 06/13/22               DME Arranged: Dan Humphreys rolling with seat DME Agency: [employer] Date DME Agency Contacted: 06/13/22 Time DME Agency Contacted: 1140   HH Arranged: NA           Social Determinants of Health (SDOH) Interventions Housing Interventions: [id number] Referral, Inpatient TOC  Readmission Risk Interventions     No data to display

## 2022-06-13 NOTE — Discharge Summary (Signed)
Physician Discharge Summary  [name] QMG:[id number] DOB: 04-30-1976 DOA: 06/11/2022  PCP: Pcp, No  Admit date: 06/11/2022  Discharge date: 06/13/2022  Admitted From: Home.  Disposition:  Home.  Recommendations for Outpatient Follow-up:  Follow up with PCP in 1-2 weeks. Please obtain BMP/CBC in one week. Advised to follow-up with Neurology as scheduled. Advised to take Keppra 1000 mg twice daily for seizures. Advised to refrain from cocaine and other substances.  Home Health: None Equipment/Devices: None  Discharge Condition: Stable CODE STATUS: Full code Diet recommendation: Heart Healthy   Brief [facility] Course: This 46 years old female with PMH significant for seizure disorder, alcohol abuse, polysubstance abuse on naltrexone, anxiety/depression brought in by the boyfriend for suspected seizure activity.  Boyfriend reports that patient was really stressed out about hearing some news yesterday evening then she started to drink beer 1 to 2 cans then suddenly became unresponsive and started to have whole body shaking and she threw up 2 times with the stomach content.  He denied any tongue biting or urinary or bowel involuntary movements.  EMS arrived and found patient was still seizing , Patient was given Versed.  She has developed second episode of seizures in route to the hospital.  Boyfriend reports patient drinks alcohol every day.  CT head negative for acute findings.  Patient was loaded with Keppra and neurology is consulted.  CT head, MRI without any acute abnormality.  EEG was completed which shows no evidence of seizures.  Neurology recommended continue Keppra.  Advised patient to refrain from cocaine or other substances.  Which was explained in detail to the patient.  Patient feels better and wants to be discharged.  Patient is being discharged home  Discharge Diagnoses:  Principal Problem:   Seizure ([place]) Active Problems:   Seizure disorder ([place])    Diabetes mellitus type 2 in nonobese ([place])   Anxiety and depression   Intractable headache   Cocaine abuse with cocaine-induced mood disorder ([place])   Alcohol use disorder, moderate, dependence ([place])  Acute metabolic Encephalopathy: > Resolved. Postictal versus alcohol intoxication: Patient presented with whole body shaking following heavy alcohol intake. Could be  seizures from alcohol intoxication. Continue CIWA protocol Continue seizure precautions. Continue Keppra 1000 mg twice daily. Kept NPO and then resumed on regular diet. Neurology is consulted recommended MRI which is unremarkable for any acute abnormality. EEG was completed shows no evidence of seizures. Low suspicion for status epilepticus.   Urine drug screen positive for cocaine and benzos. Recommend alcohol and cocaine cessation. Trend CK level as she has high risk for rhabdomyolysis from cocaine and seizures. Neurology recommended outpatient neurology follow-up and continue Keppra.   Breakthrough seizures: Patient seems noncompliant with seizure medications Neurology consulted, MRI negative for acute abnormality Continue Keppra.  EEG no evidence of seizures. Neurology recommended outpatient follow-up   Narcotic abuse: Continue naltrexone when able to   Alcohol abuse: There is no signs and symptoms of acute alcohol withdrawal. Continue CIWA protocol with as needed benzos   Asthma/COPD: Patient breathing normally.  No signs of any acute exacerbation. Continue home Pulmicort and as needed albuterol.   Substance abuse: Urine drug seen positive for cocaine and benzos. Recommend alcohol and cocaine cessation Education officer, museum consulted, resources given.   Discharge Instructions  Discharge Instructions     Ambulatory referral to Neurology   Complete by: As directed    An appointment is requested in approximately: 2 weeks   Call MD for:  difficulty breathing, headache or visual  disturbances   Complete by: As  directed    Call MD for:  persistant dizziness or light-headedness   Complete by: As directed    Call MD for:  persistant nausea and vomiting   Complete by: As directed    Diet - low sodium heart healthy   Complete by: As directed    Diet Carb Modified   Complete by: As directed    Discharge instructions   Complete by: As directed    Advised to follow-up with primary care physician in 1 week. Advised to follow-up with neurology as scheduled. Advised to take Keppra 1000 mg twice daily for seizures. Advised to refrain from cocaine and other substances.   Increase activity slowly   Complete by: As directed       Allergies as of 06/13/2022       Reactions   Ibuprofen Diarrhea   Tramadol Itching   Vaginal itching, per patient's original chart   Acetaminophen Rash        Medication List     STOP taking these medications    naltrexone 50 MG tablet Commonly known as: DEPADE   PHENObarbital 30 MG tablet Commonly known as: LUMINAL       TAKE these medications    albuterol 108 (90 Base) MCG/ACT inhaler Commonly known as: VENTOLIN HFA Inhale 2 puffs into the lungs every 4 (four) hours as needed for wheezing or shortness of breath.   albuterol (2.5 MG/3ML) 0.083% nebulizer solution Commonly known as: PROVENTIL Take 3 mLs (2.5 mg total) by nebulization every 6 (six) hours as needed for wheezing or shortness of breath.   blood glucose meter kit and supplies Dispense based on patient and insurance preference. Use up to four times daily as directed. (FOR ICD-10 E10.9, E11.9).   cyclobenzaprine 10 MG tablet Commonly known as: FLEXERIL Take 10 mg by mouth 3 (three) times daily as needed for muscle spasms.   folic acid 1 MG tablet Commonly known as: FOLVITE Take 1 tablet (1 mg total) by mouth daily.   levETIRAcetam 1000 MG tablet Commonly known as: Keppra Take 1 tablet (1,000 mg total) by mouth 2 (two) times daily.   Pulmicort Flexhaler 90 MCG/ACT inhaler Generic  drug: Budesonide Inhale 1 puff into the lungs 2 (two) times daily. Rinse mouth with water and spit after every use. What changed:  when to take this reasons to take this   thiamine 100 MG tablet Commonly known as: Vitamin B-1 Take 1 tablet (100 mg total) by mouth daily.               Durable Medical Equipment  (From admission, onward)           Start     Ordered   06/13/22 1102  For home use only DME Other see comment  Once       Comments: Pt needs rollator d/t generalized weakness and fatigue  Question:  Length of Need  Answer:  12 Months   06/13/22 1102            Follow-up Information     Lora Havens, MD Follow up in 2 week(s).   Specialty: Neurology Contact information: [place] [address] [place] [address] [phone]                Allergies  Allergen Reactions   Ibuprofen Diarrhea   Tramadol Itching    Vaginal itching, per patient's original chart   Acetaminophen Rash    Consultations: Neurology  Procedures/Studies: Overnight EEG with video  Result Date: 06/12/2022 Lora Havens, MD     06/13/2022  9:08 AM Patient Name: Jennifer Crawford MRN: [id number] Epilepsy Attending: Lora Havens Referring Physician/Provider: Janine Ores, NP Duration: 06/11/2022 1745 to 06/12/2022 1745 Patient history: 46 y.o. female with a past medical history of anxiety, depression, alcohol abuse, polysubstance abuse on naltrexone, diabetes myelitis, vitamin D deficiency, and seizures.  Patient presents today after being picked up by EMS from [facility].  A family member called EMS after the patient had a reported tonic clonic seizure.  On EEG to evaluate for seizure. Level of alertness: Awake, asleep AEDs during EEG study: LEV, Phenobarb, versed Technical aspects: This EEG study was done with scalp electrodes positioned according to the 10-20 International system of electrode placement. Electrical activity was reviewed with band pass filter of 1-70Hz ,  sensitivity of 7 uV/mm, display speed of 83m/sec with a 60Hz  notched filter applied as appropriate. EEG data were recorded continuously and digitally stored.  Video monitoring was available and reviewed as appropriate. Description: The posterior dominant rhythm consists of 10 Hz activity of moderate voltage (25-35 uV) seen predominantly in posterior head regions, symmetric and reactive to eye opening and eye closing. Sleep was characterized by vertex waves, sleep spindles (12 to 14 Hz), maximal frontocentral region. Hyperventilation and photic stimulation were not performed.   IMPRESSION: This study is within normal limits. No seizures or epileptiform discharges were seen throughout the recording. A normal interictal EEG does not exclude nor support the diagnosis of epilepsy. PLora Havens  MR BRAIN W WO CONTRAST  Result Date: 06/11/2022 CLINICAL DATA:  Seizure. New onset. No history of trauma. EXAM: MRI HEAD WITHOUT AND WITH CONTRAST TECHNIQUE: Multiplanar, multiecho pulse sequences of the brain and surrounding structures were obtained without and with intravenous contrast. CONTRAST:  7.516mGADAVIST GADOBUTROL 1 MMOL/ML IV SOLN COMPARISON:  CT head without contrast 06/11/2022. MR head 06/20/2015. FINDINGS: Brain: No acute infarct, hemorrhage, or mass lesion is present. No significant white matter lesions are present. The ventricles are of normal size. No significant extraaxial fluid collection is present. Dedicated imaging of the temporal lobes demonstrates symmetric size and signal of the hippocampal structures. No mass lesion present. The internal auditory canals are within normal limits. The brainstem and cerebellum are within normal limits. Postcontrast images demonstrate no pathologic enhancement. Vascular: Flow is present in the major intracranial arteries. Skull and upper cervical spine: The craniocervical junction is normal. Upper cervical spine is within normal limits. Marrow signal is  unremarkable. Sinuses/Orbits: The paranasal sinuses and mastoid air cells are clear. The globes and orbits are within normal limits. IMPRESSION: Negative MRI of the brain without and with contrast. No acute or focal lesion to explain seizures. Electronically Signed   By: ChSan Morelle.D.   On: 06/11/2022 16:53   CT Head Wo Contrast  Result Date: 06/11/2022 CLINICAL DATA:  Seizure EXAM: CT HEAD WITHOUT CONTRAST TECHNIQUE: Contiguous axial images were obtained from the base of the skull through the vertex without intravenous contrast. RADIATION DOSE REDUCTION: This exam was performed according to the departmental dose-optimization program which includes automated exposure control, adjustment of the mA and/or kV according to patient size and/or use of iterative reconstruction technique. COMPARISON:  02/18/2022 FINDINGS: Brain: No evidence of acute infarction, hemorrhage, hydrocephalus, extra-axial collection or mass lesion/mass effect. Vascular: No hyperdense vessel or unexpected calcification. Skull: Normal. Negative for fracture or focal lesion. Sinuses/Orbits: The visualized paranasal sinuses are essentially clear. The mastoid  air cells are unopacified. Other: None. IMPRESSION: Normal head CT. Electronically Signed   By: Julian Hy M.D.   On: 06/11/2022 03:44    CT head, MRI head, EEG   Subjective: Patient was seen and examined at bedside.  Overnight events noted.   Patient is fully awake and alert and oriented x3.  Feels better and wants to be discharged.  Discharge Exam: Vitals:   06/13/22 0432 06/13/22 0800  BP:  95/60  Pulse:  71  Resp:  14  Temp: 98.1 F (36.7 C) (!) 97.4 F (36.3 C)  SpO2:  96%   Vitals:   06/12/22 2000 06/12/22 2324 06/13/22 0432 06/13/22 0800  BP:    95/60  Pulse:    71  Resp:    14  Temp: 98.5 F (36.9 C) 98.1 F (36.7 C) 98.1 F (36.7 C) (!) 97.4 F (36.3 C)  TempSrc: Oral Oral Oral Oral  SpO2:    96%    General: Pt is alert, awake, not  in acute distress Cardiovascular: RRR, S1/S2 +, no rubs, no gallops Respiratory: CTA bilaterally, no wheezing, no rhonchi Abdominal: Soft, NT, ND, bowel sounds + Extremities: no edema, no cyanosis    The results of significant diagnostics from this hospitalization (including imaging, microbiology, ancillary and laboratory) are listed below for reference.     Microbiology: No results found for this or any previous visit (from the past 240 hour(s)).   Labs: BNP (last 3 results) No results for input(s): "BNP" in the last 8760 hours. Basic Metabolic Panel: Recent Labs  Lab 06/11/22 0245 06/12/22 0717 06/13/22 0244  NA 138 137 137  K 3.3* 4.0 3.6  CL 107 108 108  CO2 22 23 22   GLUCOSE 93 123* 131*  BUN 10 12 10   CREATININE 1.11* 1.05* 0.92  CALCIUM 9.1 8.8* 8.9  MG  --   --  1.7  PHOS  --   --  3.1   Liver Function Tests: Recent Labs  Lab 06/11/22 0245  AST 28  ALT 20  ALKPHOS 60  BILITOT 0.3  PROT 7.6  ALBUMIN 4.1   No results for input(s): "LIPASE", "AMYLASE" in the last 168 hours. No results for input(s): "AMMONIA" in the last 168 hours. CBC: Recent Labs  Lab 06/11/22 0245 06/13/22 0244  WBC 4.4 5.2  NEUTROABS 2.0  --   HGB 11.7* 11.5*  HCT 35.9* 33.9*  MCV 94.5 93.1  PLT 196 184   Cardiac Enzymes: Recent Labs  Lab 06/11/22 2211 06/12/22 0717  CKTOTAL 489* 370*   BNP: Invalid input(s): "POCBNP" CBG: Recent Labs  Lab 06/11/22 0230  GLUCAP 81   D-Dimer No results for input(s): "DDIMER" in the last 72 hours. Hgb A1c No results for input(s): "HGBA1C" in the last 72 hours. Lipid Profile No results for input(s): "CHOL", "HDL", "LDLCALC", "TRIG", "CHOLHDL", "LDLDIRECT" in the last 72 hours. Thyroid function studies No results for input(s): "TSH", "T4TOTAL", "T3FREE", "THYROIDAB" in the last 72 hours.  Invalid input(s): "FREET3" Anemia work up No results for input(s): "VITAMINB12", "FOLATE", "FERRITIN", "TIBC", "IRON", "RETICCTPCT" in the  last 72 hours. Urinalysis    Component Value Date/Time   COLORURINE YELLOW 06/11/2022 1540   APPEARANCEUR HAZY (A) 06/11/2022 1540   LABSPEC 1.013 06/11/2022 1540   PHURINE 5.0 06/11/2022 1540   GLUCOSEU NEGATIVE 06/11/2022 1540   HGBUR SMALL (A) 06/11/2022 1540   BILIRUBINUR NEGATIVE 06/11/2022 1540   BILIRUBINUR neg 06/16/2019 [place] 06/11/2022 1540   PROTEINUR NEGATIVE  06/11/2022 1540   UROBILINOGEN 0.2 12/22/2020 1413   NITRITE NEGATIVE 06/11/2022 1540   LEUKOCYTESUR NEGATIVE 06/11/2022 1540   Sepsis Labs Recent Labs  Lab 06/11/22 0245 06/13/22 0244  WBC 4.4 5.2   Microbiology No results found for this or any previous visit (from the past 240 hour(s)).   Time coordinating discharge: Over 30 minutes  SIGNED:   Shawna Clamp, MD  [facility] 06/13/2022, 2:23 PM Pager   If 7PM-7AM, please contact night-coverage

## 2022-06-13 NOTE — TOC Initial Note (Signed)
Transition of Care [facility]) - Initial/Assessment Note    Patient Details  Name: Jennifer Crawford MRN: [id number] Date of Birth: Sep 03, 1976  Transition of [facility]) CM/SW Contact:    Dale [place], Student-Social Work Phone Number: 06/13/2022, 11:04 AM  Clinical Narrative:                 MSW intern spoke with patient and patient's boyfriend at bedside to provide community resources for substance/alcohol misuse. Patient stated she is concerned as she has nowhere to discharge to as her children are currently at the [facility], but she is unsure how she will pay for the room tonight. MSW intern then provided a shelter/homeless list for the patient and patient's boyfriend. Patient was very grateful and stated she would call the shelters right now. MSW intern also advised a referral to [facility] would be completed to see if any additional options were available.   Expected Discharge Plan: Home/Self Care Barriers to Discharge: Barriers Resolved   Patient Goals and CMS Choice        Expected Discharge Plan and Services Expected Discharge Plan: Home/Self Care       Living arrangements for the past 2 months: Apartment Expected Discharge Date: 06/13/22                                    Prior Living Arrangements/Services Living arrangements for the past 2 months: Apartment Lives with:: Significant Other, Minor Children Patient language and need for interpreter reviewed:: Yes Do you feel safe going back to the place where you live?: Yes      Need for Family Participation in Patient Care: No (Comment) Care giver support system in place?: Yes (comment)   Criminal Activity/Legal Involvement Pertinent to Current Situation/Hospitalization: No - Comment as needed  Activities of Daily Living Home Assistive Devices/Equipment: None ADL Screening (condition at time of admission) Patient's cognitive ability adequate to safely complete daily activities?: No Is the patient  deaf or have difficulty hearing?: No Does the patient have difficulty seeing, even when wearing glasses/contacts?: No Does the patient have difficulty concentrating, remembering, or making decisions?: Yes Patient able to express need for assistance with ADLs?: No Does the patient have difficulty dressing or bathing?: Yes Independently performs ADLs?: No Communication: Independent Dressing (OT): Needs assistance Does the patient have difficulty walking or climbing stairs?: Yes Weakness of Legs: Both Weakness of Arms/Hands: Both  Permission Sought/Granted Permission sought to share information with : Family Supports                Emotional Assessment Appearance:: Appears stated age Attitude/Demeanor/Rapport: Engaged, Gracious Affect (typically observed): Accepting Orientation: : Oriented to Self, Oriented to Place, Oriented to  Time, Oriented to Situation Alcohol / Substance Use: Alcohol Use, Illicit Drugs Psych Involvement: No (comment)  Admission diagnosis:  Seizure [facility]) [R56.9] Patient Active Problem List   Diagnosis Date Noted   Seizure (HCC) 06/11/2022   Cocaine abuse with cocaine-induced mood disorder (HCC) 11/27/2020   Alcohol use disorder, moderate, dependence (HCC) 11/27/2020   Hypoglycemia 06/27/2019   Hematuria 06/27/2019   Low back pain with sciatica 05/31/2019   Left flank pain 05/31/2019   Intractable headache 05/13/2019   Right upper quadrant pain 05/13/2019   Right flank pain 05/13/2019   Diabetes mellitus type 2 in nonobese (HCC) 06/20/2015   Elevated blood alcohol level 06/20/2015   Anxiety and depression 06/20/2015   Seizure  disorder (HCC) 06/19/2015   PCP:  Oneita Hurt, No Pharmacy:   [id number] [place], [place] - [phone] AT [facility] & [address] [place] [address] Phone: [phone] Fax: [phone]     Social Determinants of Health (SDOH) Interventions    Readmission Risk  Interventions     No data to display
